# Patient Record
Sex: Female | Born: 1935 | Race: White | Hispanic: No | Smoking: Never smoker
Health system: Southern US, Community
[De-identification: ages and names within clinical notes are randomized; demographics above are authoritative.]

## PROBLEM LIST (undated history)

## (undated) DIAGNOSIS — I4891 Unspecified atrial fibrillation: Secondary | ICD-10-CM

## (undated) DIAGNOSIS — K5790 Diverticulosis of intestine, part unspecified, without perforation or abscess without bleeding: Secondary | ICD-10-CM

## (undated) DIAGNOSIS — I700 Atherosclerosis of aorta: Secondary | ICD-10-CM

## (undated) DIAGNOSIS — K279 Peptic ulcer, site unspecified, unspecified as acute or chronic, without hemorrhage or perforation: Secondary | ICD-10-CM

## (undated) DIAGNOSIS — M8580 Other specified disorders of bone density and structure, unspecified site: Secondary | ICD-10-CM

## (undated) DIAGNOSIS — D649 Anemia, unspecified: Secondary | ICD-10-CM

## (undated) DIAGNOSIS — C800 Disseminated malignant neoplasm, unspecified: Secondary | ICD-10-CM

## (undated) DIAGNOSIS — D164 Benign neoplasm of bones of skull and face: Secondary | ICD-10-CM

## (undated) DIAGNOSIS — R519 Headache, unspecified: Secondary | ICD-10-CM

## (undated) DIAGNOSIS — E785 Hyperlipidemia, unspecified: Secondary | ICD-10-CM

## (undated) DIAGNOSIS — M1990 Unspecified osteoarthritis, unspecified site: Secondary | ICD-10-CM

## (undated) DIAGNOSIS — I499 Cardiac arrhythmia, unspecified: Secondary | ICD-10-CM

## (undated) DIAGNOSIS — H269 Unspecified cataract: Secondary | ICD-10-CM

## (undated) DIAGNOSIS — K219 Gastro-esophageal reflux disease without esophagitis: Secondary | ICD-10-CM

## (undated) HISTORY — PX: OTHER SURGICAL HISTORY: SHX169

## (undated) HISTORY — DX: Atherosclerosis of aorta: I70.0

## (undated) HISTORY — DX: Diverticulosis of intestine, part unspecified, without perforation or abscess without bleeding: K57.90

## (undated) HISTORY — DX: Unspecified atrial fibrillation: I48.91

## (undated) HISTORY — DX: Unspecified cataract: H26.9

## (undated) HISTORY — DX: Anemia, unspecified: D64.9

## (undated) HISTORY — DX: Unspecified osteoarthritis, unspecified site: M19.90

## (undated) HISTORY — DX: Hyperlipidemia, unspecified: E78.5

## (undated) HISTORY — PX: TUBAL LIGATION: SHX77

## (undated) HISTORY — DX: Other specified disorders of bone density and structure, unspecified site: M85.80

## (undated) HISTORY — PX: COLONOSCOPY: SHX174

## (undated) HISTORY — DX: Gastro-esophageal reflux disease without esophagitis: K21.9

## (undated) HISTORY — DX: Disseminated malignant neoplasm, unspecified: C80.0

## (undated) HISTORY — PX: CATARACT EXTRACTION, BILATERAL: SHX1313

## (undated) HISTORY — DX: Benign neoplasm of bones of skull and face: D16.4

## (undated) HISTORY — DX: Peptic ulcer, site unspecified, unspecified as acute or chronic, without hemorrhage or perforation: K27.9

---

## 1998-05-12 ENCOUNTER — Other Ambulatory Visit: Admission: RE | Admit: 1998-05-12 | Discharge: 1998-05-12 | Payer: Self-pay | Admitting: Obstetrics & Gynecology

## 1999-08-16 ENCOUNTER — Other Ambulatory Visit: Admission: RE | Admit: 1999-08-16 | Discharge: 1999-08-16 | Payer: Self-pay | Admitting: Obstetrics & Gynecology

## 2000-10-10 ENCOUNTER — Other Ambulatory Visit: Admission: RE | Admit: 2000-10-10 | Discharge: 2000-10-10 | Payer: Self-pay | Admitting: Obstetrics & Gynecology

## 2014-11-18 DIAGNOSIS — H25811 Combined forms of age-related cataract, right eye: Secondary | ICD-10-CM | POA: Diagnosis not present

## 2014-11-18 DIAGNOSIS — K219 Gastro-esophageal reflux disease without esophagitis: Secondary | ICD-10-CM | POA: Diagnosis not present

## 2014-11-18 DIAGNOSIS — Z8711 Personal history of peptic ulcer disease: Secondary | ICD-10-CM | POA: Diagnosis not present

## 2014-12-11 DIAGNOSIS — Z23 Encounter for immunization: Secondary | ICD-10-CM | POA: Diagnosis not present

## 2014-12-15 DIAGNOSIS — Z0000 Encounter for general adult medical examination without abnormal findings: Secondary | ICD-10-CM | POA: Diagnosis not present

## 2014-12-15 DIAGNOSIS — Z1231 Encounter for screening mammogram for malignant neoplasm of breast: Secondary | ICD-10-CM | POA: Diagnosis not present

## 2014-12-30 DIAGNOSIS — K219 Gastro-esophageal reflux disease without esophagitis: Secondary | ICD-10-CM | POA: Diagnosis not present

## 2014-12-30 DIAGNOSIS — Z79899 Other long term (current) drug therapy: Secondary | ICD-10-CM | POA: Diagnosis not present

## 2014-12-30 DIAGNOSIS — H25812 Combined forms of age-related cataract, left eye: Secondary | ICD-10-CM | POA: Diagnosis not present

## 2015-01-05 DIAGNOSIS — Z1231 Encounter for screening mammogram for malignant neoplasm of breast: Secondary | ICD-10-CM | POA: Diagnosis not present

## 2015-01-28 DIAGNOSIS — K219 Gastro-esophageal reflux disease without esophagitis: Secondary | ICD-10-CM | POA: Diagnosis not present

## 2015-01-28 DIAGNOSIS — E785 Hyperlipidemia, unspecified: Secondary | ICD-10-CM | POA: Diagnosis not present

## 2015-03-31 DIAGNOSIS — R69 Illness, unspecified: Secondary | ICD-10-CM | POA: Diagnosis not present

## 2015-06-15 DIAGNOSIS — E785 Hyperlipidemia, unspecified: Secondary | ICD-10-CM | POA: Diagnosis not present

## 2015-10-06 DIAGNOSIS — R69 Illness, unspecified: Secondary | ICD-10-CM | POA: Diagnosis not present

## 2015-11-25 DIAGNOSIS — R69 Illness, unspecified: Secondary | ICD-10-CM | POA: Diagnosis not present

## 2015-12-21 DIAGNOSIS — Z0001 Encounter for general adult medical examination with abnormal findings: Secondary | ICD-10-CM | POA: Diagnosis not present

## 2015-12-21 DIAGNOSIS — K30 Functional dyspepsia: Secondary | ICD-10-CM | POA: Diagnosis not present

## 2015-12-21 DIAGNOSIS — E785 Hyperlipidemia, unspecified: Secondary | ICD-10-CM | POA: Diagnosis not present

## 2015-12-21 DIAGNOSIS — Z1231 Encounter for screening mammogram for malignant neoplasm of breast: Secondary | ICD-10-CM | POA: Diagnosis not present

## 2015-12-21 DIAGNOSIS — R5383 Other fatigue: Secondary | ICD-10-CM | POA: Diagnosis not present

## 2015-12-21 DIAGNOSIS — Z1389 Encounter for screening for other disorder: Secondary | ICD-10-CM | POA: Diagnosis not present

## 2016-01-13 DIAGNOSIS — Z0000 Encounter for general adult medical examination without abnormal findings: Secondary | ICD-10-CM | POA: Diagnosis not present

## 2016-01-13 DIAGNOSIS — E785 Hyperlipidemia, unspecified: Secondary | ICD-10-CM | POA: Diagnosis not present

## 2016-01-13 DIAGNOSIS — Z6834 Body mass index (BMI) 34.0-34.9, adult: Secondary | ICD-10-CM | POA: Diagnosis not present

## 2016-01-13 DIAGNOSIS — K219 Gastro-esophageal reflux disease without esophagitis: Secondary | ICD-10-CM | POA: Diagnosis not present

## 2016-02-12 DIAGNOSIS — Z1231 Encounter for screening mammogram for malignant neoplasm of breast: Secondary | ICD-10-CM | POA: Diagnosis not present

## 2016-04-11 DIAGNOSIS — R69 Illness, unspecified: Secondary | ICD-10-CM | POA: Diagnosis not present

## 2016-10-19 DIAGNOSIS — R69 Illness, unspecified: Secondary | ICD-10-CM | POA: Diagnosis not present

## 2016-10-31 DIAGNOSIS — R69 Illness, unspecified: Secondary | ICD-10-CM | POA: Diagnosis not present

## 2016-11-10 DIAGNOSIS — R69 Illness, unspecified: Secondary | ICD-10-CM | POA: Diagnosis not present

## 2017-01-18 DIAGNOSIS — Z1389 Encounter for screening for other disorder: Secondary | ICD-10-CM | POA: Diagnosis not present

## 2017-01-18 DIAGNOSIS — N390 Urinary tract infection, site not specified: Secondary | ICD-10-CM | POA: Diagnosis not present

## 2017-01-18 DIAGNOSIS — M8580 Other specified disorders of bone density and structure, unspecified site: Secondary | ICD-10-CM | POA: Diagnosis not present

## 2017-01-18 DIAGNOSIS — Z23 Encounter for immunization: Secondary | ICD-10-CM | POA: Diagnosis not present

## 2017-01-18 DIAGNOSIS — R300 Dysuria: Secondary | ICD-10-CM | POA: Diagnosis not present

## 2017-01-18 DIAGNOSIS — Z1231 Encounter for screening mammogram for malignant neoplasm of breast: Secondary | ICD-10-CM | POA: Diagnosis not present

## 2017-01-18 DIAGNOSIS — Z0000 Encounter for general adult medical examination without abnormal findings: Secondary | ICD-10-CM | POA: Diagnosis not present

## 2017-01-18 DIAGNOSIS — E2839 Other primary ovarian failure: Secondary | ICD-10-CM | POA: Diagnosis not present

## 2017-03-08 DIAGNOSIS — M8580 Other specified disorders of bone density and structure, unspecified site: Secondary | ICD-10-CM | POA: Diagnosis not present

## 2017-03-08 DIAGNOSIS — E2839 Other primary ovarian failure: Secondary | ICD-10-CM | POA: Diagnosis not present

## 2017-03-08 DIAGNOSIS — Z1231 Encounter for screening mammogram for malignant neoplasm of breast: Secondary | ICD-10-CM | POA: Diagnosis not present

## 2017-03-08 DIAGNOSIS — M85851 Other specified disorders of bone density and structure, right thigh: Secondary | ICD-10-CM | POA: Diagnosis not present

## 2017-03-23 DIAGNOSIS — R928 Other abnormal and inconclusive findings on diagnostic imaging of breast: Secondary | ICD-10-CM | POA: Diagnosis not present

## 2017-03-23 DIAGNOSIS — R921 Mammographic calcification found on diagnostic imaging of breast: Secondary | ICD-10-CM | POA: Diagnosis not present

## 2017-04-26 DIAGNOSIS — R69 Illness, unspecified: Secondary | ICD-10-CM | POA: Diagnosis not present

## 2017-09-18 DIAGNOSIS — R921 Mammographic calcification found on diagnostic imaging of breast: Secondary | ICD-10-CM | POA: Diagnosis not present

## 2017-10-11 DIAGNOSIS — R69 Illness, unspecified: Secondary | ICD-10-CM | POA: Diagnosis not present

## 2017-10-30 DIAGNOSIS — E1121 Type 2 diabetes mellitus with diabetic nephropathy: Secondary | ICD-10-CM | POA: Diagnosis not present

## 2017-10-30 DIAGNOSIS — R5382 Chronic fatigue, unspecified: Secondary | ICD-10-CM | POA: Diagnosis not present

## 2017-10-30 DIAGNOSIS — R69 Illness, unspecified: Secondary | ICD-10-CM | POA: Diagnosis not present

## 2018-01-18 DIAGNOSIS — R1031 Right lower quadrant pain: Secondary | ICD-10-CM | POA: Diagnosis not present

## 2018-01-18 DIAGNOSIS — Z1331 Encounter for screening for depression: Secondary | ICD-10-CM | POA: Diagnosis not present

## 2018-01-18 DIAGNOSIS — Z6825 Body mass index (BMI) 25.0-25.9, adult: Secondary | ICD-10-CM | POA: Diagnosis not present

## 2018-01-18 DIAGNOSIS — E785 Hyperlipidemia, unspecified: Secondary | ICD-10-CM | POA: Diagnosis not present

## 2018-01-18 DIAGNOSIS — Z0001 Encounter for general adult medical examination with abnormal findings: Secondary | ICD-10-CM | POA: Diagnosis not present

## 2018-01-18 DIAGNOSIS — R1011 Right upper quadrant pain: Secondary | ICD-10-CM | POA: Diagnosis not present

## 2018-01-18 DIAGNOSIS — Z1339 Encounter for screening examination for other mental health and behavioral disorders: Secondary | ICD-10-CM | POA: Diagnosis not present

## 2018-01-23 DIAGNOSIS — R1011 Right upper quadrant pain: Secondary | ICD-10-CM | POA: Diagnosis not present

## 2018-01-23 DIAGNOSIS — K7689 Other specified diseases of liver: Secondary | ICD-10-CM | POA: Diagnosis not present

## 2018-02-13 DIAGNOSIS — R1011 Right upper quadrant pain: Secondary | ICD-10-CM | POA: Diagnosis not present

## 2018-03-22 DIAGNOSIS — R928 Other abnormal and inconclusive findings on diagnostic imaging of breast: Secondary | ICD-10-CM | POA: Diagnosis not present

## 2018-03-22 DIAGNOSIS — R921 Mammographic calcification found on diagnostic imaging of breast: Secondary | ICD-10-CM | POA: Diagnosis not present

## 2018-08-13 DIAGNOSIS — Z6824 Body mass index (BMI) 24.0-24.9, adult: Secondary | ICD-10-CM | POA: Diagnosis not present

## 2018-08-13 DIAGNOSIS — M5431 Sciatica, right side: Secondary | ICD-10-CM | POA: Diagnosis not present

## 2018-08-20 DIAGNOSIS — R69 Illness, unspecified: Secondary | ICD-10-CM | POA: Diagnosis not present

## 2018-11-15 DIAGNOSIS — R69 Illness, unspecified: Secondary | ICD-10-CM | POA: Diagnosis not present

## 2019-01-15 DIAGNOSIS — M810 Age-related osteoporosis without current pathological fracture: Secondary | ICD-10-CM | POA: Diagnosis not present

## 2019-01-15 DIAGNOSIS — Z1331 Encounter for screening for depression: Secondary | ICD-10-CM | POA: Diagnosis not present

## 2019-01-15 DIAGNOSIS — Z0000 Encounter for general adult medical examination without abnormal findings: Secondary | ICD-10-CM | POA: Diagnosis not present

## 2019-01-15 DIAGNOSIS — R5383 Other fatigue: Secondary | ICD-10-CM | POA: Diagnosis not present

## 2019-01-15 DIAGNOSIS — Z6825 Body mass index (BMI) 25.0-25.9, adult: Secondary | ICD-10-CM | POA: Diagnosis not present

## 2019-01-15 DIAGNOSIS — Z1339 Encounter for screening examination for other mental health and behavioral disorders: Secondary | ICD-10-CM | POA: Diagnosis not present

## 2019-01-15 DIAGNOSIS — N390 Urinary tract infection, site not specified: Secondary | ICD-10-CM | POA: Diagnosis not present

## 2019-01-15 DIAGNOSIS — R300 Dysuria: Secondary | ICD-10-CM | POA: Diagnosis not present

## 2019-01-15 DIAGNOSIS — M25551 Pain in right hip: Secondary | ICD-10-CM | POA: Diagnosis not present

## 2019-01-15 DIAGNOSIS — E785 Hyperlipidemia, unspecified: Secondary | ICD-10-CM | POA: Diagnosis not present

## 2019-02-04 DIAGNOSIS — R079 Chest pain, unspecified: Secondary | ICD-10-CM | POA: Diagnosis not present

## 2019-02-07 DIAGNOSIS — I4891 Unspecified atrial fibrillation: Secondary | ICD-10-CM | POA: Diagnosis not present

## 2019-02-07 DIAGNOSIS — R739 Hyperglycemia, unspecified: Secondary | ICD-10-CM

## 2019-02-07 DIAGNOSIS — I480 Paroxysmal atrial fibrillation: Secondary | ICD-10-CM | POA: Diagnosis not present

## 2019-02-07 DIAGNOSIS — R079 Chest pain, unspecified: Secondary | ICD-10-CM | POA: Diagnosis not present

## 2019-02-07 DIAGNOSIS — R7303 Prediabetes: Secondary | ICD-10-CM | POA: Diagnosis not present

## 2019-02-07 DIAGNOSIS — Z8711 Personal history of peptic ulcer disease: Secondary | ICD-10-CM | POA: Diagnosis not present

## 2019-02-07 DIAGNOSIS — R0602 Shortness of breath: Secondary | ICD-10-CM | POA: Diagnosis not present

## 2019-02-07 DIAGNOSIS — Z79899 Other long term (current) drug therapy: Secondary | ICD-10-CM | POA: Diagnosis not present

## 2019-02-08 DIAGNOSIS — I480 Paroxysmal atrial fibrillation: Secondary | ICD-10-CM

## 2019-02-08 DIAGNOSIS — Z79899 Other long term (current) drug therapy: Secondary | ICD-10-CM | POA: Diagnosis not present

## 2019-02-08 DIAGNOSIS — R7303 Prediabetes: Secondary | ICD-10-CM | POA: Diagnosis not present

## 2019-02-08 DIAGNOSIS — I4891 Unspecified atrial fibrillation: Secondary | ICD-10-CM | POA: Diagnosis not present

## 2019-02-08 DIAGNOSIS — Z8711 Personal history of peptic ulcer disease: Secondary | ICD-10-CM | POA: Diagnosis not present

## 2019-02-08 HISTORY — DX: Paroxysmal atrial fibrillation: I48.0

## 2019-02-15 DIAGNOSIS — Z1331 Encounter for screening for depression: Secondary | ICD-10-CM | POA: Diagnosis not present

## 2019-02-15 DIAGNOSIS — Z6824 Body mass index (BMI) 24.0-24.9, adult: Secondary | ICD-10-CM | POA: Diagnosis not present

## 2019-02-15 DIAGNOSIS — I4891 Unspecified atrial fibrillation: Secondary | ICD-10-CM | POA: Diagnosis not present

## 2019-02-15 DIAGNOSIS — R5383 Other fatigue: Secondary | ICD-10-CM | POA: Diagnosis not present

## 2019-02-20 DIAGNOSIS — K828 Other specified diseases of gallbladder: Secondary | ICD-10-CM | POA: Diagnosis not present

## 2019-02-20 DIAGNOSIS — K760 Fatty (change of) liver, not elsewhere classified: Secondary | ICD-10-CM | POA: Diagnosis not present

## 2019-02-20 DIAGNOSIS — Z20822 Contact with and (suspected) exposure to covid-19: Secondary | ICD-10-CM | POA: Diagnosis not present

## 2019-02-20 DIAGNOSIS — R1011 Right upper quadrant pain: Secondary | ICD-10-CM | POA: Diagnosis not present

## 2019-02-20 DIAGNOSIS — J9811 Atelectasis: Secondary | ICD-10-CM | POA: Diagnosis not present

## 2019-02-21 DIAGNOSIS — R69 Illness, unspecified: Secondary | ICD-10-CM | POA: Diagnosis not present

## 2019-03-04 ENCOUNTER — Encounter: Payer: Self-pay | Admitting: *Deleted

## 2019-03-04 ENCOUNTER — Ambulatory Visit (INDEPENDENT_AMBULATORY_CARE_PROVIDER_SITE_OTHER): Payer: Medicare HMO

## 2019-03-04 ENCOUNTER — Encounter: Payer: Self-pay | Admitting: Cardiology

## 2019-03-04 ENCOUNTER — Ambulatory Visit (INDEPENDENT_AMBULATORY_CARE_PROVIDER_SITE_OTHER): Payer: Medicare HMO | Admitting: Cardiology

## 2019-03-04 ENCOUNTER — Other Ambulatory Visit: Payer: Self-pay

## 2019-03-04 VITALS — BP 112/80 | HR 102 | Ht 62.0 in | Wt 135.0 lb

## 2019-03-04 DIAGNOSIS — D6481 Anemia due to antineoplastic chemotherapy: Secondary | ICD-10-CM | POA: Insufficient documentation

## 2019-03-04 DIAGNOSIS — D649 Anemia, unspecified: Secondary | ICD-10-CM | POA: Insufficient documentation

## 2019-03-04 DIAGNOSIS — R531 Weakness: Secondary | ICD-10-CM | POA: Diagnosis not present

## 2019-03-04 DIAGNOSIS — R0602 Shortness of breath: Secondary | ICD-10-CM | POA: Diagnosis not present

## 2019-03-04 DIAGNOSIS — R42 Dizziness and giddiness: Secondary | ICD-10-CM | POA: Diagnosis not present

## 2019-03-04 DIAGNOSIS — T451X5A Adverse effect of antineoplastic and immunosuppressive drugs, initial encounter: Secondary | ICD-10-CM | POA: Insufficient documentation

## 2019-03-04 DIAGNOSIS — R079 Chest pain, unspecified: Secondary | ICD-10-CM

## 2019-03-04 DIAGNOSIS — K279 Peptic ulcer, site unspecified, unspecified as acute or chronic, without hemorrhage or perforation: Secondary | ICD-10-CM | POA: Insufficient documentation

## 2019-03-04 DIAGNOSIS — I480 Paroxysmal atrial fibrillation: Secondary | ICD-10-CM | POA: Diagnosis not present

## 2019-03-04 DIAGNOSIS — I4891 Unspecified atrial fibrillation: Secondary | ICD-10-CM | POA: Diagnosis not present

## 2019-03-04 DIAGNOSIS — M1990 Unspecified osteoarthritis, unspecified site: Secondary | ICD-10-CM | POA: Insufficient documentation

## 2019-03-04 NOTE — Progress Notes (Addendum)
Cardiology Office Note:    Date:  03/04/2019   ID:  Whitney Floyd, DOB [birth year], MRN [id number]  PCP:  Ernestene Kiel, MD  Cardiologist:  Berniece Salines, DO  Electrophysiologist:  None   Referring MD: Ernestene Kiel, MD   Patient was referred for newly diagnosed atrial fibrillation  History of Present Illness:    Whitney Floyd is a 85 y.o. female with a hx of peptic ulcer disease on omeprazole, paroxysmal atrial fibrillation which was diagnosed on a recent hospitalization on 02/07/2019 at [facility].  Patient was started on Eliquis 5 mg twice daily as well as metoprolol succinate 25 mg daily.   Patient is here to reestablish cardiovascular care.  The patient tells me today that she has been experiencing significant fatigue and dizziness.  She notes that this has been going on prior to the hospital and since she was discharged.  In addition she admits to experiencing left-sided chest pain.  She described it as a left-sided pressure and dull sensation which lasted for a few seconds and resolve itself.  She also reported associated shortness of breath.  She is very worried because she is to be very active with 5 line dancing classes a week, she could do her on push lawnmowing and raking but recently she can barely get out of the chair.   Past Medical History:  Diagnosis Date  . Anemia   . Arthritis   . Paroxysmal A-fib ([place]) 02/08/2019  . Peptic ulcer disease     Past Surgical History:  Procedure Laterality Date  . TUBAL LIGATION      Current Medications: Current Meds  Medication Sig  . Cholecalciferol 25 MCG (1000 UT) capsule Take 1,000 Units by mouth daily.  Verneita Griffes Bark POWD 500 mg by Does not apply route daily.  . Cranberry-Vitamin C-Probiotic (AZO CRANBERRY) 250-30 MG TABS Take by mouth 2 (two) times daily.  Marland Kitchen ELIQUIS 5 MG TABS tablet Take 5 mg by mouth 2 (two) times daily.  . metoprolol succinate (TOPROL-XL) 25 MG 24 hr tablet Take 25 mg by mouth  daily.  Marland Kitchen omeprazole (PRILOSEC) 20 MG capsule Take 20 mg by mouth daily.     Allergies:   Horse-derived products and Sulfa antibiotics   Social History   Socioeconomic History  . Marital status: Single    Spouse name: Not on file  . Number of children: Not on file  . Years of education: Not on file  . Highest education level: Not on file  Occupational History  . Not on file  Tobacco Use  . Smoking status: Never Smoker  . Smokeless tobacco: Never Used  Substance and Sexual Activity  . Alcohol use: Never  . Drug use: Never  . Sexual activity: Not on file  Other Topics Concern  . Not on file  Social History Narrative  . Not on file   Social Determinants of Health   Financial Resource Strain:   . Difficulty of Paying Living Expenses: Not on file  Food Insecurity:   . Worried About Charity fundraiser in the Last Year: Not on file  . Ran Out of Food in the Last Year: Not on file  Transportation Needs:   . Lack of Transportation (Medical): Not on file  . Lack of Transportation (Non-Medical): Not on file  Physical Activity:   . Days of Exercise per Week: Not on file  . Minutes of Exercise per Session: Not on file  Stress:   . Feeling of Stress :  Not on file  Social Connections:   . Frequency of Communication with Friends and Family: Not on file  . Frequency of Social Gatherings with Friends and Family: Not on file  . Attends Religious Services: Not on file  . Active Member of Clubs or Organizations: Not on file  . Attends Archivist Meetings: Not on file  . Marital Status: Not on file     Family History: The patient's family history includes Arrhythmia in her father; Dementia in her mother; Diabetes in her mother; Stroke in her father.  ROS:   Review of Systems  Constitution: Negative for decreased appetite, fever and weight gain.  HENT: Negative for congestion, ear discharge, hoarse voice and sore throat.   Eyes: Negative for discharge, redness, vision  loss in right eye and visual halos.  Cardiovascular: Reports chest pain, dyspnea on exertion, and negative for leg swelling, orthopnea and palpitations.  Respiratory: Negative for cough, hemoptysis, shortness of breath and snoring.   Endocrine: Negative for heat intolerance and polyphagia.  Hematologic/Lymphatic: Negative for bleeding problem. Does not bruise/bleed easily.  Skin: Negative for flushing, nail changes, rash and suspicious lesions.  Musculoskeletal: Negative for arthritis, joint pain, muscle cramps, myalgias, neck pain and stiffness.  Gastrointestinal: Negative for abdominal pain, bowel incontinence, diarrhea and excessive appetite.  Genitourinary: Negative for decreased libido, genital sores and incomplete emptying.  Neurological: Negative for brief paralysis, focal weakness, headaches and loss of balance.  Psychiatric/Behavioral: Negative for altered mental status, depression and suicidal ideas.  Allergic/Immunologic: Negative for HIV exposure and persistent infections.    EKGs/Labs/Other Studies Reviewed:    The following studies were reviewed today:   EKG:  The ekg ordered today demonstrates sinus rhythm, heart rate 91 bpm with arrhythmia Compared to EKG done on 02/20/2019 patient was in sinus rhythm, heart rate 78 bpm with nonspecific ST changes.  Echocardiogram done at [facility] on 02/08/2019 normal left systolic function visually estimate ejection fraction 85 to 60%.  Diastolic filling pattern was normal for age.  Right ventricle is normal.  Right atrium normal.  Left atrium is normal.  Interatrial septum is intact.  Mild aortic valve sclerosis without stenosis.  No aortic regurgitation.  Trace mitral regurgitation.  No tricuspid regurgitation.  No pulmonic vegetation.  Aortic root, ascending aorta and aortic arch are normal in size.  No pericardial effusion.  CTA chest 02/07/2019 No evidence of pulmonary embolism.  No appreciation of thoracic aneurysm or  dissection.  Mild aortic sclerosis.  No pleural effusion.  Aortic atherosclerosis.  Fluid in the region of the spleen presumably ascites.  Recent Labs: CBC: WBC 7.3, globin 12.3, hematocrit 37, platelet 279 Chemistry: Sodium 137, potassium 3.9, chloride 107, bicarb 27, BUN 9, creatinine 0.8, glucose 95 Hemoglobin A1c 6.1  Recent Lipid Panel No results found for: CHOL, TRIG, HDL, CHOLHDL, VLDL, LDLCALC, LDLDIRECT  Physical Exam:    VS:  BP 112/80 (BP Location: Left Arm, Patient Position: Sitting, Cuff Size: Normal)   Pulse (!) 102   Ht 5\' 2"  (1.575 m)   Wt 135 lb (61.2 kg)   SpO2 97%   BMI 24.69 kg/m     Wt Readings from Last 3 Encounters:  03/04/19 135 lb (61.2 kg)  02/08/19 134 lb (60.8 kg)     GEN: Well nourished, well developed in no acute distress HEENT: Normal NECK: No JVD; No carotid bruits LYMPHATICS: No lymphadenopathy CARDIAC: S1S2 noted,RRR, no murmurs, rubs, gallops RESPIRATORY:  Clear to auscultation without rales, wheezing or rhonchi  ABDOMEN:  Soft, non-tender, non-distended, +bowel sounds, no guarding. EXTREMITIES: No edema, No cyanosis, no clubbing MUSCULOSKELETAL:  No edema; No deformity  SKIN: Warm and dry NEUROLOGIC:  Alert and oriented x 3, non-focal PSYCHIATRIC:  Normal affect, good insight  ASSESSMENT:    1. Paroxysmal atrial fibrillation (HCC)   2. Dizziness   3. Shortness of breath   4. Chest pain of uncertain etiology   5. Generalized weakness    PLAN:    Chest pain-with her risk factors and symptoms of chest discomfort is appropriate to pursue an ischemic evaluation in this patient.  I have discussed with the patient about pharmacologic nuclear stress test, at this time she is agreed to proceed with this testing.  She was also advised if the pain recurs to go to the nearest emergency department.  Shortness of breath-I reviewed her echocardiogram which was done at [facility] structural abnormalities that could explain this.  I do  suspect that paroxysmal atrial fibrillation could be contributing.  At this point cannot rule out that this could be an anginal equivalent therefore as stated above ischemic evaluation will be performed as described above.  Paroxysmal atrial fibrillation-she was started on Eliquis 5 mg twice daily as well as metoprolol succinate 12.5 mg daily.  She is in sinus rhythm today.  Given her dizziness and generalized fatigue I like to understand her atrial fibrillation burden, heart excursions and if there is any other underlying arrhythmias so we can place a 7-day ZIO monitor on the patient.  For now we will continue with rate control strategy.  Once we can rule out coronary disease we can be able to understand and make appropriate recommendation for antiarrhythmics.  I have educated patient on the side effects of this medication. I also urge her to abstain from any taking behaviors.  She understands that she is now at a high risk of bleeding due to being on anticoagulation.  She was also advised that if she ever falls and especially hit her head to be seen in the emergency department.  She will get comprehensive blood work today which will include CBC to make sure that there is no drop in her hemoglobin given her worsening generalized fatigue and shortness of breath, TSH, BMP and mag.  The patient and her daughter-in-law on the phone are both in agreement with the above plan. The patient left the office in stable condition.  The patient will follow up in 2 months or sooner if needed.  Medication Adjustments/Labs and Tests Ordered: Current medicines are reviewed at length with the patient today.  Concerns regarding medicines are outlined above.  Orders Placed This Encounter  Procedures  . Basic Metabolic Panel (BMET)  . Magnesium  . CBC  . TSH  . Hepatic function panel  . LONG TERM MONITOR (3-14 DAYS)  . MYOCARDIAL PERFUSION IMAGING  . EKG 12-Lead   No orders of the defined types were placed in this  encounter.   Patient Instructions  Medication Instructions:  Your physician recommends that you continue on your current medications as directed. Please refer to the Current Medication list given to you today.  *If you need a refill on your cardiac medications before your next appointment, please call your pharmacy*  Lab Work: Your physician recommends that you return for lab work in: TODAY BMP,CBC,TSH,LIVER,Magnesium  If you have labs (blood work) drawn today and your tests are completely normal, you will receive your results only by: Marland Kitchen MyChart Message (if you have MyChart) OR . A  paper copy in the mail If you have any lab test that is abnormal or we need to change your treatment, we will call you to review the results.  Testing/Procedures: A zio monitor was ordered today. It will remain on for 7 days. You will then return monitor and event diary in provided box. It takes 1-2 weeks for report to be downloaded and returned to [place]. We will call you with the results. If monitor falls off or has orange flashing light, please call Zio for further instructions.   Your physician has requested that you have a lexiscan myoview. For further information please visit [email or web address]. Please follow instruction sheet, as given.   Follow-Up: At [facility], you and your health needs are our priority.  As part of our continuing mission to provide you with exceptional heart care, we have created designated Provider Care Teams.  These Care Teams include your primary Cardiologist (physician) and Advanced Practice Providers (APPs -  Physician Assistants and Nurse Practitioners) who all work together to provide you with the care you need, when you need it.  Your next appointment:  2 months  The format for your next appointment:   In PERSON  Provider:   Dr. Berniece Salines  Other Instructions  Cardiac Nuclear Scan A cardiac nuclear scan is a test that is done to check the flow of blood to your  heart. It is done when you are resting and when you are exercising. The test looks for problems such as:  Not enough blood reaching a portion of the heart.  The heart muscle not working as it should. You may need this test if:  You have heart disease.  You have had lab results that are not normal.  You have had heart surgery or a balloon procedure to open up blocked arteries (angioplasty).  You have chest pain.  You have shortness of breath. In this test, a special dye (tracer) is put into your bloodstream. The tracer will travel to your heart. A camera will then take pictures of your heart to see how the tracer moves through your heart. This test is usually done at a hospital and takes 2-4 hours. Tell a doctor about:  Any allergies you have.  All medicines you are taking, including vitamins, herbs, eye drops, creams, and over-the-counter medicines.  Any problems you or family members have had with anesthetic medicines.  Any blood disorders you have.  Any surgeries you have had.  Any medical conditions you have.  Whether you are pregnant or may be pregnant. What are the risks? Generally, this is a safe test. However, problems may occur, such as:  Serious chest pain and heart attack. This is only a risk if the stress portion of the test is done.  Rapid heartbeat.  A feeling of warmth in your chest. This feeling usually does not last long.  Allergic reaction to the tracer. What happens before the test?  Ask your doctor about changing or stopping your normal medicines. This is important.  Follow instructions from your doctor about what you cannot eat or drink.  Remove your jewelry on the day of the test. What happens during the test?  An IV tube will be inserted into one of your veins.  Your doctor will give you a small amount of tracer through the IV tube.  You will wait for 20-40 minutes while the tracer moves through your bloodstream.  Your heart will be  monitored with an electrocardiogram (ECG).  You will lie  down on an exam table.  Pictures of your heart will be taken for about 15-20 minutes.  You may also have a stress test. For this test, one of these things may be done: ? You will be asked to exercise on a treadmill or a stationary bike. ? You will be given medicines that will make your heart work harder. This is done if you are unable to exercise.  When blood flow to your heart has peaked, a tracer will again be given through the IV tube.  After 20-40 minutes, you will get back on the exam table. More pictures will be taken of your heart.  Depending on the tracer that is used, more pictures may need to be taken 3-4 hours later.  Your IV tube will be removed when the test is over. The test may vary among doctors and hospitals. What happens after the test?  Ask your doctor: ? Whether you can return to your normal schedule, including diet, activities, and medicines. ? Whether you should drink more fluids. This will help to remove the tracer from your body. Drink enough fluid to keep your pee (urine) pale yellow.  Ask your doctor, or the department that is doing the test: ? When will my results be ready? ? How will I get my results? Summary  A cardiac nuclear scan is a test that is done to check the flow of blood to your heart.  Tell your doctor whether you are pregnant or may be pregnant.  Before the test, ask your doctor about changing or stopping your normal medicines. This is important.  Ask your doctor whether you can return to your normal activities. You may be asked to drink more fluids. This information is not intended to replace advice given to you by your health care provider. Make sure you discuss any questions you have with your health care provider. Document Revised: 05/16/2018 Document Reviewed: 07/10/2017 Elsevier Patient Education  [place].    Adopting a Healthy Lifestyle.  Know what a healthy  weight is for you (roughly BMI <25) and aim to maintain this   Aim for 7+ servings of fruits and vegetables daily   65-80+ fluid ounces of water or unsweet tea for healthy kidneys   Limit to max 1 drink of alcohol per day; avoid smoking/tobacco   Limit animal fats in diet for cholesterol and heart health - choose grass fed whenever available   Avoid highly processed foods, and foods high in saturated/trans fats   Aim for low stress - take time to unwind and care for your mental health   Aim for 150 min of moderate intensity exercise weekly for heart health, and weights twice weekly for bone health   Aim for 7-9 hours of sleep daily   When it comes to diets, agreement about the perfect plan isnt easy to find, even among the experts. Experts at the [employer] developed an idea known as the Healthy Eating Plate. Just imagine a plate divided into logical, healthy portions.   The emphasis is on diet quality:   Load up on vegetables and fruits - one-half of your plate: Aim for color and variety, and remember that potatoes dont count.   Go for whole grains - one-quarter of your plate: Whole wheat, barley, wheat berries, quinoa, oats, brown rice, and foods made with them. If you want pasta, go with whole wheat pasta.   Protein power - one-quarter of your plate: Fish, chicken, beans, and  nuts are all healthy, versatile protein sources. Limit red meat.   The diet, however, does go beyond the plate, offering a few other suggestions.   Use healthy plant oils, such as olive, canola, soy, corn, sunflower and peanut. Check the labels, and avoid partially hydrogenated oil, which have unhealthy trans fats.   If youre thirsty, drink water. Coffee and tea are good in moderation, but skip sugary drinks and limit milk and dairy products to one or two daily servings.   The type of carbohydrate in the diet is more important than the amount. Some sources of carbohydrates, such as  vegetables, fruits, whole grains, and beans-are healthier than others.   Finally, stay active  Signed, Berniece Salines, DO  03/04/2019 11:12 AM    [place]

## 2019-03-04 NOTE — Patient Instructions (Signed)
Medication Instructions:  Your physician recommends that you continue on your current medications as directed. Please refer to the Current Medication list given to you today.  *If you need a refill on your cardiac medications before your next appointment, please call your pharmacy*  Lab Work: Your physician recommends that you return for lab work in: TODAY BMP,CBC,TSH,LIVER,Magnesium  If you have labs (blood work) drawn today and your tests are completely normal, you will receive your results only by:  Whitney Floyd (if you have MyChart) OR  A paper copy in the mail If you have any lab test that is abnormal or we need to change your treatment, we will call you to review the results.  Testing/Procedures: A zio monitor was ordered today. It will remain on for 7 days. You will then return monitor and event diary in provided box. It takes 1-2 weeks for report to be downloaded and returned to [place]. We will call you with the results. If monitor falls off or has orange flashing light, please call Zio for further instructions.   Your physician has requested that you have a lexiscan myoview. For further information please visit [email or web address]. Please follow instruction sheet, as given.   Follow-Up: At [facility], you and your health needs are our priority.  As part of our continuing mission to provide you with exceptional heart care, we have created designated Provider Care Teams.  These Care Teams include your primary Cardiologist (physician) and Advanced Practice Providers (APPs -  Physician Assistants and Nurse Practitioners) who all work together to provide you with the care you need, when you need it.  Your next appointment:  2 months  The format for your next appointment:   In PERSON  Provider:   Dr. Berniece Salines  Other Instructions  Cardiac Nuclear Scan A cardiac nuclear scan is a test that is done to check the flow of blood to your heart. It is done when you are resting and  when you are exercising. The test looks for problems such as:  Not enough blood reaching a portion of the heart.  The heart muscle not working as it should. You may need this test if:  You have heart disease.  You have had lab results that are not normal.  You have had heart surgery or a balloon procedure to open up blocked arteries (angioplasty).  You have chest pain.  You have shortness of breath. In this test, a special dye (tracer) is put into your bloodstream. The tracer will travel to your heart. A camera will then take pictures of your heart to see how the tracer moves through your heart. This test is usually done at a hospital and takes 2-4 hours. Tell a doctor about:  Any allergies you have.  All medicines you are taking, including vitamins, herbs, eye drops, creams, and over-the-counter medicines.  Any problems you or family members have had with anesthetic medicines.  Any blood disorders you have.  Any surgeries you have had.  Any medical conditions you have.  Whether you are pregnant or may be pregnant. What are the risks? Generally, this is a safe test. However, problems may occur, such as:  Serious chest pain and heart attack. This is only a risk if the stress portion of the test is done.  Rapid heartbeat.  A feeling of warmth in your chest. This feeling usually does not last long.  Allergic reaction to the tracer. What happens before the test?  Ask your doctor about changing or  stopping your normal medicines. This is important.  Follow instructions from your doctor about what you cannot eat or drink.  Remove your jewelry on the day of the test. What happens during the test?  An IV tube will be inserted into one of your veins.  Your doctor will give you a small amount of tracer through the IV tube.  You will wait for 20-40 minutes while the tracer moves through your bloodstream.  Your heart will be monitored with an electrocardiogram  (ECG).  You will lie down on an exam table.  Pictures of your heart will be taken for about 15-20 minutes.  You may also have a stress test. For this test, one of these things may be done: ? You will be asked to exercise on a treadmill or a stationary bike. ? You will be given medicines that will make your heart work harder. This is done if you are unable to exercise.  When blood flow to your heart has peaked, a tracer will again be given through the IV tube.  After 20-40 minutes, you will get back on the exam table. More pictures will be taken of your heart.  Depending on the tracer that is used, more pictures may need to be taken 3-4 hours later.  Your IV tube will be removed when the test is over. The test may vary among doctors and hospitals. What happens after the test?  Ask your doctor: ? Whether you can return to your normal schedule, including diet, activities, and medicines. ? Whether you should drink more fluids. This will help to remove the tracer from your body. Drink enough fluid to keep your pee (urine) pale yellow.  Ask your doctor, or the department that is doing the test: ? When will my results be ready? ? How will I get my results? Summary  A cardiac nuclear scan is a test that is done to check the flow of blood to your heart.  Tell your doctor whether you are pregnant or may be pregnant.  Before the test, ask your doctor about changing or stopping your normal medicines. This is important.  Ask your doctor whether you can return to your normal activities. You may be asked to drink more fluids. This information is not intended to replace advice given to you by your health care provider. Make sure you discuss any questions you have with your health care provider. Document Revised: 05/16/2018 Document Reviewed: 07/10/2017 Elsevier Patient Education  [place].

## 2019-03-05 ENCOUNTER — Telehealth: Payer: Self-pay | Admitting: *Deleted

## 2019-03-05 LAB — BASIC METABOLIC PANEL
BUN/Creatinine Ratio: 12 (ref 12–28)
BUN: 11 mg/dL (ref 8–27)
CO2: 22 mmol/L (ref 20–29)
Calcium: 8.5 mg/dL — ABNORMAL LOW (ref 8.7–10.3)
Chloride: 102 mmol/L (ref 96–106)
Creatinine, Ser: 0.92 mg/dL (ref 0.57–1.00)
GFR calc Af Amer: 67 mL/min/{1.73_m2} (ref >59)
GFR calc non Af Amer: 58 mL/min/{1.73_m2} — ABNORMAL LOW (ref >59)
Glucose: 106 mg/dL — ABNORMAL HIGH (ref 65–99)
Potassium: 4.4 mmol/L (ref 3.5–5.2)
Sodium: 137 mmol/L (ref 134–144)

## 2019-03-05 LAB — MAGNESIUM: Magnesium: 1.8 mg/dL (ref 1.6–2.3)

## 2019-03-05 LAB — HEPATIC FUNCTION PANEL
ALT: 25 IU/L (ref 0–32)
AST: 60 IU/L — ABNORMAL HIGH (ref 0–40)
Albumin: 3.6 g/dL (ref 3.6–4.6)
Alkaline Phosphatase: 69 IU/L (ref 39–117)
Bilirubin Total: 0.2 mg/dL (ref 0.0–1.2)
Bilirubin, Direct: 0.1 mg/dL (ref 0.00–0.40)
Total Protein: 7.4 g/dL (ref 6.0–8.5)

## 2019-03-05 LAB — CBC
Hematocrit: 40.9 % (ref 34.0–46.6)
Hemoglobin: 13.5 g/dL (ref 11.1–15.9)
MCH: 29.1 pg (ref 26.6–33.0)
MCHC: 33 g/dL (ref 31.5–35.7)
MCV: 88 fL (ref 79–97)
Platelets: 329 10*3/uL (ref 150–450)
RBC: 4.64 x10E6/uL (ref 3.77–5.28)
RDW: 12 % (ref 11.7–15.4)
WBC: 7 10*3/uL (ref 3.4–10.8)

## 2019-03-05 LAB — TSH: TSH: 2.58 u[IU]/mL (ref 0.450–4.500)

## 2019-03-05 NOTE — Telephone Encounter (Signed)
Patient given detailed instructions per Myocardial Perfusion Study Information Sheet for the test on 03/06/19. Patient notified to arrive 15 minutes early and that it is imperative to arrive on time for appointment to keep from having the test rescheduled.  If you need to cancel or reschedule your appointment, please call the office within 24 hours of your appointment. . Patient verbalized understanding. Kirstie Peri

## 2019-03-06 ENCOUNTER — Ambulatory Visit (INDEPENDENT_AMBULATORY_CARE_PROVIDER_SITE_OTHER): Payer: Medicare HMO

## 2019-03-06 ENCOUNTER — Other Ambulatory Visit: Payer: Self-pay

## 2019-03-06 VITALS — Ht 62.0 in | Wt 135.0 lb

## 2019-03-06 DIAGNOSIS — I480 Paroxysmal atrial fibrillation: Secondary | ICD-10-CM | POA: Diagnosis not present

## 2019-03-06 DIAGNOSIS — R079 Chest pain, unspecified: Secondary | ICD-10-CM

## 2019-03-06 DIAGNOSIS — R0602 Shortness of breath: Secondary | ICD-10-CM

## 2019-03-06 LAB — MYOCARDIAL PERFUSION IMAGING
LV dias vol: 41 mL (ref 46–106)
LV sys vol: 7 mL
Peak HR: 90 {beats}/min
Rest HR: 77 {beats}/min
SDS: 1
SRS: 0
SSS: 1
TID: 0.91

## 2019-03-06 MED ORDER — TECHNETIUM TC 99M TETROFOSMIN IV KIT
29.4000 | PACK | Freq: Once | INTRAVENOUS | Status: AC | PRN
  Administered 2019-03-06: 29.4 via INTRAVENOUS

## 2019-03-06 MED ORDER — TECHNETIUM TC 99M TETROFOSMIN IV KIT
10.2000 | PACK | Freq: Once | INTRAVENOUS | Status: AC | PRN
  Administered 2019-03-06: 10.2 via INTRAVENOUS

## 2019-03-06 MED ORDER — REGADENOSON 0.4 MG/5ML IV SOLN
0.4000 mg | Freq: Once | INTRAVENOUS | Status: AC
  Administered 2019-03-06: 0.4 mg via INTRAVENOUS

## 2019-03-07 ENCOUNTER — Encounter: Payer: Self-pay | Admitting: *Deleted

## 2019-03-13 DIAGNOSIS — R1084 Generalized abdominal pain: Secondary | ICD-10-CM | POA: Diagnosis not present

## 2019-03-13 DIAGNOSIS — Z6825 Body mass index (BMI) 25.0-25.9, adult: Secondary | ICD-10-CM | POA: Diagnosis not present

## 2019-03-13 DIAGNOSIS — R5383 Other fatigue: Secondary | ICD-10-CM | POA: Diagnosis not present

## 2019-03-13 DIAGNOSIS — I4891 Unspecified atrial fibrillation: Secondary | ICD-10-CM | POA: Diagnosis not present

## 2019-03-18 DIAGNOSIS — R42 Dizziness and giddiness: Secondary | ICD-10-CM | POA: Diagnosis not present

## 2019-03-20 DIAGNOSIS — R1084 Generalized abdominal pain: Secondary | ICD-10-CM | POA: Diagnosis not present

## 2019-03-20 DIAGNOSIS — R109 Unspecified abdominal pain: Secondary | ICD-10-CM | POA: Diagnosis not present

## 2019-03-26 DIAGNOSIS — R1904 Left lower quadrant abdominal swelling, mass and lump: Secondary | ICD-10-CM | POA: Diagnosis not present

## 2019-03-26 DIAGNOSIS — Z6825 Body mass index (BMI) 25.0-25.9, adult: Secondary | ICD-10-CM | POA: Diagnosis not present

## 2019-03-26 DIAGNOSIS — N838 Other noninflammatory disorders of ovary, fallopian tube and broad ligament: Secondary | ICD-10-CM | POA: Diagnosis not present

## 2019-03-26 DIAGNOSIS — C569 Malignant neoplasm of unspecified ovary: Secondary | ICD-10-CM | POA: Diagnosis not present

## 2019-03-27 ENCOUNTER — Other Ambulatory Visit: Payer: Self-pay

## 2019-03-27 ENCOUNTER — Ambulatory Visit
Admission: RE | Admit: 2019-03-27 | Discharge: 2019-03-27 | Disposition: A | Discharge status: Home / Self Care | Payer: Self-pay | Source: Ambulatory Visit | Attending: Gynecologic Oncology | Admitting: Gynecologic Oncology

## 2019-03-27 DIAGNOSIS — N838 Other noninflammatory disorders of ovary, fallopian tube and broad ligament: Secondary | ICD-10-CM

## 2019-03-28 ENCOUNTER — Telehealth: Payer: Self-pay | Admitting: *Deleted

## 2019-03-28 ENCOUNTER — Telehealth: Payer: Self-pay | Admitting: Cardiology

## 2019-03-28 NOTE — Telephone Encounter (Signed)
-----   Message from Berniece Salines, DO sent at 03/27/2019 11:16 PM EST ----- Please see if the patient can see me earlier in the next 2 weeks to discuss her monitor results.

## 2019-03-28 NOTE — Telephone Encounter (Signed)
Called the patient and scheduled a new patient appt on 2/24 at 10:45. Explained the policy for parking, visitors and masks. Also explained that she will have a pelvic exam that day.    Order in and called [facility] to power share CT from [facility]

## 2019-03-28 NOTE — Telephone Encounter (Signed)
Patient informed. Copy sent to PCP °

## 2019-03-28 NOTE — Telephone Encounter (Signed)
Daughter in law called to inform office that patient may need a procedure or surgery for possible ovarian cancer that is still in the process of being worked up for confirmation. Daughter in Law wanted to make sure cardiology was aware and also says that patient may need cardiac clearance or permission to hold eliquis. Advised that patient's provider would be informed and advised that if patient is needing cardiac clearance or has questions about holding eliquis before procedure/surgery, that the surgeon or provider's office doing the the procedure should send the request to our office. Verbalized understanding.

## 2019-03-28 NOTE — Telephone Encounter (Signed)
New message   Per daughter-in-law has additional information about a medical condition. Please call to discuss.

## 2019-03-29 ENCOUNTER — Encounter: Payer: Self-pay | Admitting: *Deleted

## 2019-04-02 ENCOUNTER — Telehealth: Payer: Self-pay

## 2019-04-02 NOTE — Telephone Encounter (Signed)
ENCOUNTER OPENED IN ERROR

## 2019-04-02 NOTE — Progress Notes (Signed)
GYNECOLOGIC ONCOLOGY NEW PATIENT CONSULTATION   Patient Name: Whitney Floyd  Patient Age: 85 y.o. Date of Service: 04/03/19 Referring Provider: Ernestene Kiel, MD [address] [place],  [place] [address]   Primary Care Provider: Ernestene Kiel, MD Consulting Provider: Jeral Pinch, MD   Assessment/Plan:  85 year old female with peritoneal and omental disease consistent with metastatic cancer in the setting of an elevated CA-125 and small adnexal mass suspicious for gynecologic primary.  I reviewed the patient's recent CT scan with her.  Given constellation of symptoms, elevated tumor marker and CT findings, I am concerned that she has a gynecologic malignancy.  Without tissue biopsy, though, the patient understands that we cannot make a definitive diagnosis.  We discussed the strategies for treatment of ovarian cancer which include upfront surgery followed by adjuvant chemotherapy versus neoadjuvant chemotherapy followed by interval debulking surgery and adjuvant chemotherapy.  This decision is made after consideration of patient's comorbidities, surgical fitness, and extent of disease.  I explained that the goal of surgery is to debulk 2 minimal or no residual disease.  Given findings on her abdominal and pelvic CT scan, I am concerned that I would not be able to resect all areas of cancer and, at this time given what I know, would recommend proceeding with neoadjuvant chemotherapy.  We reviewed that the next step in determining a treatment plan is biopsy to confirm pathology and origin of the carcinomatosis.  Given her large omental cake that is palpable on abdominal exam, I think it will be feasible for interventional radiology to percutaneously biopsy this.  An order was placed for CT-guided biopsy today although they may be able to do it under ultrasound guidance.  Additionally, given the extent of intra-abdominal disease, I have recommended that we also get a CT of her chest to rule  out pulmonary metastases.  Patient has follow-up with her cardiologist next week.  I think at this point, she does not need surgical clearance as I suspect it will be at least several months before we consider operative management.  A copy of this note was sent to the patient's referring provider.   55 minutes of total time was spent for this patient encounter, including preparation, face-to-face counseling with the patient and coordination of care, and documentation of the encounter.   Whitney Pinch, MD  Division of Gynecologic Oncology  Department of Obstetrics and Gynecology  [facility]  ___________________________________________  Chief Complaint: Chief Complaint  Patient presents with  . Ovarian mass, left    New Patient  . Elevated CA-125  . Carcinomatosis ([place])    History of Present Illness:  Whitney Floyd is a 85 y.o. y.o. female who is seen in consultation at the request of Floyd, Whitney Racer, MD for an evaluation of significant peritoneal and omental disease in the setting of an elevated CA-125 and small adnexal mass concerning for gynecologic primary.  The patient was initially seen in the emergency department on 12/31 after she had a near syncopal event.  Her heart rate was in the 180s on presentation and she was in A. fib.  She converted to sinus rhythm with IV Cardizem.  An echo at that time showed a left ventricular ejection fraction of 55-60%.  CT angio of the chest noted aortic atherosclerosis but no pulmonary embolism.  She was discharged on metoprolol and Eliquis.  She was seen by her primary care physician for follow-up on 1/8 and noted decreased energy, decreased appetite and reflux especially at  night.  Lab values were checked including TSH, T4, CBC and CMP.  No significant abnormal values noted.  The patient was seen again for follow-up at the beginning of February.  The patient endorsed having a difficult time eating anything dry  as well as having the urge to vomit if she presses on her epigastric region.  She has had some right upper abdominal pain for which she went to the emergency department and had an ultrasound of her liver performed and was told that nothing abnormal was seen.  She also endorsed some intermittent right lower abdominal pain.  She continued to endorse decreased energy as well as appetite.  Patient underwent CT scan showing a 3.6 cm complex left adnexal mass, polypoid gastric lesion and significant omental and peritoneal disease consistent with carcinomatosis.  Tumor markers notable for a normal CEA (3.1), normal Ca1 25 (13), and elevated Ca1 25 (392).  Labs were repeated and significant for mildly elevated ALT of 34, AST of 80, and decreased albumin at 3.1.  Today, she notes that about a year ago, she developed right sided sciatica.  She was seen for this and after doing exercises had improvement of her symptoms and was able to return to line dancing, which she enjoyed doing 3 times a week.  About 2 years ago, she notes having some right upper quadrant and flank pain.  She saw her primary care provider for this and ultimately had an ultrasound that she reports showed a gallbladder polyp.  She was seen by a surgeon and states that no surgical intervention was recommended.  She has continued to have intermittent pain on the right side.  Proximately 3 weeks ago, she had a severe episode of pain that "felt like a knife cutting" her.  The next morning, she went to [facility] emergency department where she was seen, had an upper quadrant ultrasound that she was told was normal.  She had improvement of her pain over the next 2 to 3 days.  In terms of her atrial fibrillation symptoms, she continues to notice intermittent palpitations and related shortness of breath, which she says happens most frequently in the morning.  These episodes, she endorses having difficulty breathing and pain in her right shoulder.  She also has  recently developed right lower quadrant pain, pain with urination and bowel function.  Since she started Eliquis, she reports diarrhea and flatus but no solid stool (on average 3 bowel movements a day).  She also endorses early satiety, and difficulty eating certain foods which she describes as feeling like the food expands in her mouth and ultimately she has to spit it out.  She has an appointment with her cardiologist next Wednesday.  PAST MEDICAL HISTORY:  Past Medical History:  Diagnosis Date  . Anemia   . Aortic atherosclerosis ([place])   . Arthritis   . Atrial fibrillation ([place])   . 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 Carcinomatosis ([place])   . Cataract   . Diverticulosis   . GERD (gastroesophageal reflux disease)   . Hyperlipidemia   . Osteopenia   . Paroxysmal A-fib ([place]) 02/08/2019  . Peptic ulcer disease      PAST SURGICAL HISTORY:  Past Surgical History:  Procedure Laterality Date  . benign bone tumor removal    . CATARACT EXTRACTION, BILATERAL    . TUBAL LIGATION      OB/GYN HISTORY:  OB History  Gravida Para Term Preterm AB Living  6 6  SAB TAB Ectopic Multiple Live Births               # Outcome Date GA Lbr Len/2nd Weight Sex Delivery Anes PTL Lv  6 Para           5 Para           4 Para           3 Para           2 Para           1 Para             No LMP recorded.  Age at menarche: 21 Age at menopause: Does not remember when her menses ceased, she notes menopause in her early to mid 76s that she was on hormone replacement therapy with Premarin and a progesterone for some time Hx of HRT: Yes Hx of STDs: Denies Last pap: At approximately age 52 History of abnormal pap smears: Denies  SCREENING STUDIES:  Last mammogram: Around the age of 27, normal  Last colonoscopy: Around the age of 29, normal  MEDICATIONS: Outpatient Encounter Medications as of 04/03/2019  Medication Sig  . Cholecalciferol 25 MCG (1000 UT) capsule Take 1,000  Units by mouth daily.  Verneita Griffes Bark POWD 500 mg by Does not apply route daily.  . Cranberry-Vitamin C-Probiotic (AZO CRANBERRY) 250-30 MG TABS Take by mouth 2 (two) times daily.  Marland Kitchen ELIQUIS 5 MG TABS tablet Take 5 mg by mouth 2 (two) times daily.  . metoprolol succinate (TOPROL-XL) 25 MG 24 hr tablet Take 25 mg by mouth daily.  Marland Kitchen omeprazole (PRILOSEC) 20 MG capsule Take 20 mg by mouth daily.   No facility-administered encounter medications on file as of 04/03/2019.    ALLERGIES:  Allergies  Allergen Reactions  . Codeine Sulfate [Codeine] Nausea And Vomiting  . Tetanus Toxoids Hives  . Horse-Derived Products Hives and Rash  . Sulfa Antibiotics Hives and Rash     FAMILY HISTORY:  Family History  Problem Relation Age of Onset  . Diabetes Mother   . Dementia Mother   . Other Mother        Had large benign ovarian mass removed surgically  . Arrhythmia Father   . Stroke Father   . Ovarian cancer Paternal Grandmother        Thinks that she died relatively young  . Breast cancer Paternal Aunt        Died in her 64s or 24s  . Colon cancer Neg Hx   . Uterine cancer Neg Hx   . Prostate cancer Neg Hx      SOCIAL HISTORY:    Social Connections:   . Frequency of Communication with Friends and Family: Not on file  . Frequency of Social Gatherings with Friends and Family: Not on file  . Attends Religious Services: Not on file  . Active Member of Clubs or Organizations: Not on file  . Attends Archivist Meetings: Not on file  . Marital Status: Not on file    REVIEW OF SYSTEMS:  Positive for decreased appetite, hearing loss, shortness of breath, chest pain, leg swelling, abdominal distention, abdominal pain, diarrhea, early satiety, dysuria, urinary frequency, hot flashes, pelvic pain, and anxiety. Denies fevers, chills, fatigue, unexplained weight changes. Denies neck lumps or masses, mouth sores, ringing in ears or voice changes. Denies cough or wheezing.  Denies chest  pain or palpitations.  Denies abdominal pain, blood in stools, constipation, nausea, vomiting. Denies  pain with intercourse, hematuria or incontinence. Denies vaginal bleeding or vaginal discharge.   Denies joint pain, back pain or muscle pain/cramps. Denies itching, rash, or wounds. Denies dizziness, headaches, numbness or seizures. Denies swollen lymph nodes or glands, denies easy bruising or bleeding. Denies depression, confusion, or decreased concentration.  Physical Exam:  Vital Signs for this encounter:  Blood pressure 132/73, pulse 90, temperature 98.3 F (36.8 C), temperature source Temporal, resp. rate 17, height 5\' 2"  (1.575 m), weight 130 lb 6.4 oz (59.1 kg), SpO2 100 %. Body mass index is 23.85 kg/m. General: Alert, oriented, no acute distress.  HEENT: Normocephalic, atraumatic. Sclera anicteric.  Chest: Clear to auscultation bilaterally. No wheezes, rhonchi, or rales. Cardiovascular: Regular rate and rhythm, no murmurs, rubs, or gallops.  Abdomen: Normoactive bowel sounds. Soft, mildly distended, nontender to palpation. No hepatosplenomegaly appreciated.  Firmness in the left upper quadrant and flank consistent with omental cake.  No palpable fluid wave.  Extremities: Grossly normal range of motion. Warm, well perfused. No edema bilaterally.  Skin: No rashes or lesions.  Lymphatics: No cervical, supraclavicular, or inguinal adenopathy.  GU:  Normal external female genitalia although significantly atrophic. No lesions. No discharge or bleeding.             Bladder/urethra:  No lesions or masses, well supported bladder             Vagina: Significantly narrowed introitus secondary to atrophy.  Unable to perform speculum exam.             Cervix: On palpation, cervix small and without lesions.             Uterus: Small, mobile, no parametrial involvement or nodularity.             Adnexa: No masses palpable.  Rectal: On rectovaginal exam, some minimal nodularity noted at the  apex of the rectovaginal septum.  LABORATORY AND RADIOLOGIC DATA:  Outside medical records were reviewed to synthesize the above history, along with the history and physical obtained during the visit.   Lab Results  Component Value Date   WBC 7.0 03/04/2019   HGB 13.5 03/04/2019   HCT 40.9 03/04/2019   PLT 329 03/04/2019   GLUCOSE 106 (H) 03/04/2019   ALT 25 03/04/2019   AST 60 (H) 03/04/2019   NA 137 03/04/2019   K 4.4 03/04/2019   CL 102 03/04/2019   CREATININE 0.92 03/04/2019   BUN 11 03/04/2019   CO2 22 03/04/2019   TSH 2.580 03/04/2019   CT abdomen/pelvis with contrast on 2/1: Bulky omental and peritoneal soft tissue disease.  Lymphoma could have this appearance but metastatic disease is suspected.  No definitive primary source identified.  There is a 3.6 x 3.1 x 2.2 cm complex cystic structure noted in the left adnexal space suggesting a possible ovarian etiology.  The polypoid lesion 2.8 cm in the gastric fundus more suggestive of food than a mass lesion.  Trace ascites. Performed at [facility] radiology.

## 2019-04-03 ENCOUNTER — Encounter: Payer: Self-pay | Admitting: Gynecologic Oncology

## 2019-04-03 ENCOUNTER — Inpatient Hospital Stay: Payer: Medicare HMO | Attending: Gynecologic Oncology | Admitting: Gynecologic Oncology

## 2019-04-03 ENCOUNTER — Encounter: Payer: Self-pay | Admitting: Oncology

## 2019-04-03 ENCOUNTER — Telehealth: Payer: Self-pay | Admitting: Cardiology

## 2019-04-03 ENCOUNTER — Encounter (HOSPITAL_COMMUNITY): Payer: Self-pay | Admitting: Radiology

## 2019-04-03 ENCOUNTER — Telehealth: Payer: Self-pay

## 2019-04-03 ENCOUNTER — Other Ambulatory Visit: Payer: Self-pay

## 2019-04-03 VITALS — BP 132/73 | HR 90 | Temp 98.3°F | Resp 17 | Ht 62.0 in | Wt 130.4 lb

## 2019-04-03 DIAGNOSIS — R309 Painful micturition, unspecified: Secondary | ICD-10-CM | POA: Insufficient documentation

## 2019-04-03 DIAGNOSIS — R1031 Right lower quadrant pain: Secondary | ICD-10-CM | POA: Insufficient documentation

## 2019-04-03 DIAGNOSIS — C800 Disseminated malignant neoplasm, unspecified: Secondary | ICD-10-CM | POA: Diagnosis not present

## 2019-04-03 DIAGNOSIS — Z79899 Other long term (current) drug therapy: Secondary | ICD-10-CM | POA: Insufficient documentation

## 2019-04-03 DIAGNOSIS — C801 Malignant (primary) neoplasm, unspecified: Secondary | ICD-10-CM | POA: Diagnosis not present

## 2019-04-03 DIAGNOSIS — I480 Paroxysmal atrial fibrillation: Secondary | ICD-10-CM | POA: Insufficient documentation

## 2019-04-03 DIAGNOSIS — R6881 Early satiety: Secondary | ICD-10-CM | POA: Diagnosis not present

## 2019-04-03 DIAGNOSIS — M8580 Other specified disorders of bone density and structure, unspecified site: Secondary | ICD-10-CM | POA: Insufficient documentation

## 2019-04-03 DIAGNOSIS — Z833 Family history of diabetes mellitus: Secondary | ICD-10-CM | POA: Insufficient documentation

## 2019-04-03 DIAGNOSIS — Z803 Family history of malignant neoplasm of breast: Secondary | ICD-10-CM | POA: Diagnosis not present

## 2019-04-03 DIAGNOSIS — Z8241 Family history of sudden cardiac death: Secondary | ICD-10-CM | POA: Insufficient documentation

## 2019-04-03 DIAGNOSIS — Z7901 Long term (current) use of anticoagulants: Secondary | ICD-10-CM | POA: Insufficient documentation

## 2019-04-03 DIAGNOSIS — C786 Secondary malignant neoplasm of retroperitoneum and peritoneum: Secondary | ICD-10-CM | POA: Diagnosis not present

## 2019-04-03 DIAGNOSIS — R971 Elevated cancer antigen 125 [CA 125]: Secondary | ICD-10-CM | POA: Insufficient documentation

## 2019-04-03 DIAGNOSIS — Z823 Family history of stroke: Secondary | ICD-10-CM | POA: Diagnosis not present

## 2019-04-03 DIAGNOSIS — N838 Other noninflammatory disorders of ovary, fallopian tube and broad ligament: Secondary | ICD-10-CM

## 2019-04-03 DIAGNOSIS — C562 Malignant neoplasm of left ovary: Secondary | ICD-10-CM | POA: Insufficient documentation

## 2019-04-03 DIAGNOSIS — E785 Hyperlipidemia, unspecified: Secondary | ICD-10-CM | POA: Diagnosis not present

## 2019-04-03 NOTE — Progress Notes (Signed)
Met with Bartolo Darter after her visit with Dr. Berline Lopes.  Provided her with my business card and encouraged her to call with any questions.

## 2019-04-03 NOTE — Progress Notes (Signed)
Kairy B. Swierczynski Female, 85 y.o., 1934-02-19 MRN:  [id number] Phone:  [phone] (H) ... PCP:  Ernestene Kiel, MD Coverage:  [place] [employer]na Medicare Hmo/Ppo Next Appt With Radiology ([facility]-CT 1) 04/11/2019 at 9:00 AM  RE: CT Biopsy Received: Today Message Contents  Lafonda Mosses, MD  Heidi Dach, Kardie, DO  Fine from my standpoint - Dr. Godfrey Pick, is it ok for this patient to hold anti-coagulation for a biopsy to help determine origin of her metastatic disease?  Thanks!       Previous Messages   ----- Message -----  From: Garth Bigness D  Sent: 04/03/2019 12:25 PM EST  To: Lafonda Mosses, MD  Subject: FW: CT Biopsy                   Patient on Eliquis will need to hold 2 days prior to Biopsy. Is it okay to hold Eliquis?  ----- Message -----  From: Garth Bigness D  Sent: 04/03/2019 12:21 PM EST  To: Ir Procedure Requests  Subject: CT Biopsy                     Procedure:  CT Biopsy   Reason: carcinomatosis, elevated CA-125   CT abdomen/pelvis with contrast on 2/1:  Bulky omental and peritoneal soft tissue disease. Lymphoma could have this appearance but metastatic disease is suspected. No definitive primary source identified. There is a 3.6 x 3.1 x 2.2 cm complex cystic structure noted in the left adnexal space suggesting a possible ovarian etiology. The polypoid lesion 2.8 cm in the gastric fundus more suggestive of food than a mass lesion. Trace ascites.  Performed at [facility] radiology.    History: Outside imaging uploaded, CT Abd/Pel done at [facility], CT Chest scheduled for 04/11/19   Provider: Lafonda Mosses   Provider Contact: [phone]

## 2019-04-03 NOTE — Telephone Encounter (Signed)
      [facility] Pre-operative Risk Assessment    Request for surgical clearance:  1. What type of surgery is being performed? CT guided omental biopsy   2. When is this surgery scheduled? TBD, preferably within 1-2 weeks  3. What type of clearance is required (medical clearance vs. Pharmacy clearance to hold med vs. Both)? pharmacy  4. Are there any medications that need to be held prior to surgery and how long? eliquis 2 days  5. Practice name and name of physician performing surgery? [facility], Dr. Berline Lopes  6. What is your office phone number: [phone]   7.   What is your office fax number: [phone]  8.   Anesthesia type (None, local, MAC, general) ? Not sure   Annabell Sabal 04/03/2019, 3:44 PM  _________________________________________________________________   (provider comments below)

## 2019-04-03 NOTE — Progress Notes (Signed)
Jamilynn B. Szott Female, 85 y.o., Apr 24, 1934 MRN:  [id number] Phone:  [phone] (H) ... PCP:  Ernestene Kiel, MD Coverage:  [place] Medicare/[employer]etna Medicare Hmo/Ppo Next Appt With Cardiology 04/17/2019 at 11:20 AM  RE: CT Biopsy Received: Today Message Contents  Corrie Mckusick, DO  Garth Bigness D  OK for CT guided omental mass.   Best target likely the left omental mass, 51/92 series 2 of outside CT.   Earleen Newport       Previous Messages   ----- Message -----  From: Garth Bigness D  Sent: 04/03/2019 12:21 PM EST  To: Ir Procedure Requests  Subject: CT Biopsy                     Procedure:  CT Biopsy   Reason: carcinomatosis, elevated CA-125   CT abdomen/pelvis with contrast on 2/1:  Bulky omental and peritoneal soft tissue disease. Lymphoma could have this appearance but metastatic disease is suspected. No definitive primary source identified. There is a 3.6 x 3.1 x 2.2 cm complex cystic structure noted in the left adnexal space suggesting a possible ovarian etiology. The polypoid lesion 2.8 cm in the gastric fundus more suggestive of food than a mass lesion. Trace ascites.  Performed at [facility] radiology.    History: Outside imaging uploaded, CT Abd/Pel done at [facility], CT Chest scheduled for 04/11/19   Provider: Lafonda Mosses   Provider Contact: [phone]

## 2019-04-03 NOTE — Progress Notes (Signed)
Whitney Floyd Female, 85 y.o., 10-01-1934 MRN:  [id number] Phone:  [phone] (H) ... PCP:  Ernestene Kiel, MD Coverage:  [place] Medicare/Aetna Medicare Hmo/Ppo Next Appt With Radiology ([facility]-CT 1) 04/11/2019 at 9:00 AM  RE: CT Biopsy Received: Today Message Contents  Tobb, Godfrey Pick, DO  Lafonda Mosses, MD; Fonda Kinder, RN  Hello,  She is on Eliquis, it is ok to hold for two days prior to biopsy. Please have her resume as soon as possible at the discretion of the surgeon or physician performing the procedure.   Dr. Harriet Masson       Previous Messages   ----- Message -----  From: Lafonda Mosses, MD  Sent: 04/03/2019 12:44 PM EST  To: Darrall Dears, DO  Subject: RE: CT Biopsy                   Fine from my standpoint - Dr. Godfrey Pick, is it ok for this patient to hold anti-coagulation for a biopsy to help determine origin of her metastatic disease?  Thanks!  ----- Message -----  From: Garth Bigness D  Sent: 04/03/2019 12:25 PM EST  To: Lafonda Mosses, MD  Subject: FW: CT Biopsy                   Patient on Eliquis will need to hold 2 days prior to Biopsy. Is it okay to hold Eliquis?  ----- Message -----  From: Garth Bigness D  Sent: 04/03/2019 12:21 PM EST  To: Ir Procedure Requests  Subject: CT Biopsy                     Procedure:  CT Biopsy   Reason: carcinomatosis, elevated CA-125   CT abdomen/pelvis with contrast on 2/1:  Bulky omental and peritoneal soft tissue disease. Lymphoma could have this appearance but metastatic disease is suspected. No definitive primary source identified. There is a 3.6 x 3.1 x 2.2 cm complex cystic structure noted in the left adnexal space suggesting a possible ovarian etiology. The polypoid lesion 2.8 cm in the gastric fundus more suggestive of food than a mass lesion. Trace ascites.  Performed at [facility] radiology.     History: Outside imaging uploaded, CT Abd/Pel done at [facility], CT Chest scheduled for 04/11/19   Provider: Lafonda Mosses   Provider Contact: [phone]

## 2019-04-03 NOTE — Telephone Encounter (Signed)
Patient's daughter in law called to inform us that patient had a CT Chest at [facility] on 02/07/19.  DIL wanted to know if another scan was necessary so soon.  Per MD, no scan needed at this time, ok to cancel.  DIL voiced understanding of above.

## 2019-04-03 NOTE — Telephone Encounter (Signed)
Spoke with Selina at Dr. Terrial Rhodes office regarding patient holding eliquis dose (5 mg bid) for two days prior to CT guided omental biopsy - date to be determined.  Selina to forward message to their pre op team, will contact this office with recommendation.

## 2019-04-03 NOTE — Telephone Encounter (Signed)
Pharm please address eliquis thanks 

## 2019-04-03 NOTE — Patient Instructions (Signed)
It was a pleasure meeting you today.  I will call you with the results of your chest imaging as well as once we have the pathology results from your biopsy.  As we discussed today, given the extent of disease, I suspect that we will be proceeding with systemic treatment (chemotherapy) first.  Please call our clinic if you have any questions or concerns at [phone].

## 2019-04-03 NOTE — Telephone Encounter (Signed)
I spoke with Butch Penny who is calling to let Dr Harriet Masson know patient has been diagnosed with cancer of the ovary and has a stomach mass.  Will need biopsy. Also may need chemotherapy

## 2019-04-03 NOTE — Telephone Encounter (Signed)
Patient's daughter-in-law Aarian Burfeind calling to let Dr. Harriet Masson know the patient found out she has ovarian cancer and that she may have abdominal cancer. She states she would like a nurse to call her or the patient back so they can give an update on the patient.

## 2019-04-04 ENCOUNTER — Other Ambulatory Visit: Payer: Self-pay

## 2019-04-04 ENCOUNTER — Telehealth: Payer: Self-pay | Admitting: *Deleted

## 2019-04-04 ENCOUNTER — Ambulatory Visit
Admission: RE | Admit: 2019-04-04 | Discharge: 2019-04-04 | Disposition: A | Discharge status: Home / Self Care | Payer: Self-pay | Source: Ambulatory Visit | Attending: Gynecologic Oncology | Admitting: Gynecologic Oncology

## 2019-04-04 DIAGNOSIS — Z7901 Long term (current) use of anticoagulants: Secondary | ICD-10-CM | POA: Diagnosis not present

## 2019-04-04 DIAGNOSIS — Z79899 Other long term (current) drug therapy: Secondary | ICD-10-CM | POA: Diagnosis not present

## 2019-04-04 DIAGNOSIS — R079 Chest pain, unspecified: Secondary | ICD-10-CM | POA: Diagnosis not present

## 2019-04-04 DIAGNOSIS — N838 Other noninflammatory disorders of ovary, fallopian tube and broad ligament: Secondary | ICD-10-CM

## 2019-04-04 DIAGNOSIS — R42 Dizziness and giddiness: Secondary | ICD-10-CM | POA: Diagnosis not present

## 2019-04-04 DIAGNOSIS — R748 Abnormal levels of other serum enzymes: Secondary | ICD-10-CM | POA: Diagnosis not present

## 2019-04-04 DIAGNOSIS — K219 Gastro-esophageal reflux disease without esophagitis: Secondary | ICD-10-CM | POA: Diagnosis not present

## 2019-04-04 DIAGNOSIS — I4891 Unspecified atrial fibrillation: Secondary | ICD-10-CM | POA: Diagnosis not present

## 2019-04-04 DIAGNOSIS — R0602 Shortness of breath: Secondary | ICD-10-CM | POA: Diagnosis not present

## 2019-04-04 NOTE — Telephone Encounter (Signed)
I did get a message about her biopsy. The patient may hold her Eliquis for 2 days prior to her biopsy. She may resume her Eliquis at soon as possible at the discretion of the physician performing the procedure.

## 2019-04-04 NOTE — Telephone Encounter (Signed)
Rockne Menghini, RPH-CPP  Pharmacist  Specialty:  Pharmacist  Telephone Encounter  Signed  Encounter Date:  04/03/2019          Signed         Show:Clear all [x] Manual[x] Template[] Copied  Added by: [x] Alvstad, Casimiro Needle, RPH-CPP  [] Hover for details Patient with diagnosis of atrial fibrillation on Eliquis for anticoagulation.    Procedure: CT guided omental biopsy Date of procedure: TBD  CHADS2-VASc score of  3 (AGE x 2, female)  CrCl 44 Platelet count 329  Per office protocol, patient can hold Eliquis for 2 days prior to procedure.    Patient will not need bridging with Lovenox (enoxaparin) around procedure.          Electronically signed by Rockne Menghini, RPH-CPP at 04/04/2019 8:12 AM   Telephone on 04/03/2019     Detailed Report

## 2019-04-04 NOTE — Telephone Encounter (Signed)
Patient with diagnosis of atrial fibrillation on Eliquis for anticoagulation.    Procedure: CT guided omental biopsy Date of procedure: TBD  CHADS2-VASc score of  3 (AGE x 2, female)  CrCl 44 Platelet count 329  Per office protocol, patient can hold Eliquis for 2 days prior to procedure.    Patient will not need bridging with Lovenox (enoxaparin) around procedure.

## 2019-04-04 NOTE — Telephone Encounter (Signed)
Told  Whitney Floyd that Dr.Tobb's office said that it would be fine to hold her Eliquis 2 days prior to her biopsy on 04-11-19 as noted below by [name] which would be hold beginning 04-09-19. Pt verbalized understanding.

## 2019-04-04 NOTE — Telephone Encounter (Signed)
Called and left a message with canopy to upload the CT chest from [facility] on 02/07/2019

## 2019-04-05 ENCOUNTER — Telehealth: Payer: Self-pay | Admitting: Cardiology

## 2019-04-05 DIAGNOSIS — I4891 Unspecified atrial fibrillation: Secondary | ICD-10-CM | POA: Diagnosis not present

## 2019-04-05 DIAGNOSIS — K219 Gastro-esophageal reflux disease without esophagitis: Secondary | ICD-10-CM | POA: Diagnosis not present

## 2019-04-05 NOTE — Telephone Encounter (Signed)
Patient's daughter in law, Butch Penny, states the patient has been in [facility] due to an increase in her HR. Butch Penny states the patient has been advised to switch from metoprolol succinate (TOPROL-XL) 25 MG to Cardizem medication.   Please return call to Butch Penny and advise at [phone].

## 2019-04-09 ENCOUNTER — Other Ambulatory Visit: Payer: Self-pay | Admitting: Radiology

## 2019-04-09 DIAGNOSIS — C562 Malignant neoplasm of left ovary: Secondary | ICD-10-CM | POA: Diagnosis not present

## 2019-04-09 DIAGNOSIS — R634 Abnormal weight loss: Secondary | ICD-10-CM | POA: Diagnosis not present

## 2019-04-09 DIAGNOSIS — Z6824 Body mass index (BMI) 24.0-24.9, adult: Secondary | ICD-10-CM | POA: Diagnosis not present

## 2019-04-09 DIAGNOSIS — I4891 Unspecified atrial fibrillation: Secondary | ICD-10-CM | POA: Diagnosis not present

## 2019-04-10 NOTE — Telephone Encounter (Signed)
That is fine 

## 2019-04-11 ENCOUNTER — Other Ambulatory Visit: Payer: Self-pay

## 2019-04-11 ENCOUNTER — Encounter (HOSPITAL_COMMUNITY): Payer: Self-pay

## 2019-04-11 ENCOUNTER — Ambulatory Visit (HOSPITAL_COMMUNITY): Payer: Medicare HMO

## 2019-04-11 ENCOUNTER — Ambulatory Visit (HOSPITAL_COMMUNITY)
Admission: RE | Admit: 2019-04-11 | Discharge: 2019-04-11 | Disposition: A | Discharge status: Home / Self Care | Payer: Medicare HMO | Source: Ambulatory Visit | Attending: Gynecologic Oncology | Admitting: Gynecologic Oncology

## 2019-04-11 DIAGNOSIS — E785 Hyperlipidemia, unspecified: Secondary | ICD-10-CM | POA: Diagnosis not present

## 2019-04-11 DIAGNOSIS — K219 Gastro-esophageal reflux disease without esophagitis: Secondary | ICD-10-CM | POA: Diagnosis not present

## 2019-04-11 DIAGNOSIS — R1084 Generalized abdominal pain: Secondary | ICD-10-CM | POA: Insufficient documentation

## 2019-04-11 DIAGNOSIS — I480 Paroxysmal atrial fibrillation: Secondary | ICD-10-CM | POA: Diagnosis not present

## 2019-04-11 DIAGNOSIS — M8580 Other specified disorders of bone density and structure, unspecified site: Secondary | ICD-10-CM | POA: Insufficient documentation

## 2019-04-11 DIAGNOSIS — Z7901 Long term (current) use of anticoagulants: Secondary | ICD-10-CM | POA: Insufficient documentation

## 2019-04-11 DIAGNOSIS — Z79899 Other long term (current) drug therapy: Secondary | ICD-10-CM | POA: Insufficient documentation

## 2019-04-11 DIAGNOSIS — R971 Elevated cancer antigen 125 [CA 125]: Secondary | ICD-10-CM | POA: Insufficient documentation

## 2019-04-11 DIAGNOSIS — R220 Localized swelling, mass and lump, head: Secondary | ICD-10-CM | POA: Insufficient documentation

## 2019-04-11 DIAGNOSIS — M1990 Unspecified osteoarthritis, unspecified site: Secondary | ICD-10-CM | POA: Diagnosis not present

## 2019-04-11 DIAGNOSIS — R180 Malignant ascites: Secondary | ICD-10-CM | POA: Diagnosis not present

## 2019-04-11 DIAGNOSIS — R188 Other ascites: Secondary | ICD-10-CM | POA: Insufficient documentation

## 2019-04-11 DIAGNOSIS — K668 Other specified disorders of peritoneum: Secondary | ICD-10-CM | POA: Diagnosis not present

## 2019-04-11 DIAGNOSIS — C801 Malignant (primary) neoplasm, unspecified: Secondary | ICD-10-CM | POA: Diagnosis not present

## 2019-04-11 DIAGNOSIS — C786 Secondary malignant neoplasm of retroperitoneum and peritoneum: Secondary | ICD-10-CM | POA: Diagnosis not present

## 2019-04-11 LAB — CBC WITH DIFFERENTIAL/PLATELET
Abs Immature Granulocytes: 0.03 10*3/uL (ref 0.00–0.07)
Basophils Absolute: 0.1 10*3/uL (ref 0.0–0.1)
Basophils Relative: 1 %
Eosinophils Absolute: 0.2 10*3/uL (ref 0.0–0.5)
Eosinophils Relative: 3 %
HCT: 38.9 % (ref 36.0–46.0)
Hemoglobin: 12.5 g/dL (ref 12.0–15.0)
Immature Granulocytes: 0 %
Lymphocytes Relative: 17 %
Lymphs Abs: 1.4 10*3/uL (ref 0.7–4.0)
MCH: 28.7 pg (ref 26.0–34.0)
MCHC: 32.1 g/dL (ref 30.0–36.0)
MCV: 89.4 fL (ref 80.0–100.0)
Monocytes Absolute: 0.7 10*3/uL (ref 0.1–1.0)
Monocytes Relative: 8 %
Neutro Abs: 5.7 10*3/uL (ref 1.7–7.7)
Neutrophils Relative %: 71 %
Platelets: 366 10*3/uL (ref 150–400)
RBC: 4.35 MIL/uL (ref 3.87–5.11)
RDW: 13.6 % (ref 11.5–15.5)
WBC: 8.1 10*3/uL (ref 4.0–10.5)
nRBC: 0 % (ref 0.0–0.2)

## 2019-04-11 LAB — PROTIME-INR
INR: 1 (ref 0.8–1.2)
Prothrombin Time: 12.9 seconds (ref 11.4–15.2)

## 2019-04-11 MED ORDER — MIDAZOLAM HCL 2 MG/2ML IJ SOLN
INTRAMUSCULAR | Status: AC | PRN
  Administered 2019-04-11 (×2): 1 mg via INTRAVENOUS

## 2019-04-11 MED ORDER — MIDAZOLAM HCL 2 MG/2ML IJ SOLN
INTRAMUSCULAR | Status: AC
  Filled 2019-04-11: qty 4

## 2019-04-11 MED ORDER — FENTANYL CITRATE (PF) 100 MCG/2ML IJ SOLN
INTRAMUSCULAR | Status: AC | PRN
  Administered 2019-04-11: 50 ug via INTRAVENOUS

## 2019-04-11 MED ORDER — LIDOCAINE HCL (PF) 1 % IJ SOLN
INTRAMUSCULAR | Status: AC | PRN
  Administered 2019-04-11: 10 mL

## 2019-04-11 MED ORDER — FENTANYL CITRATE (PF) 100 MCG/2ML IJ SOLN
INTRAMUSCULAR | Status: AC
  Filled 2019-04-11: qty 2

## 2019-04-11 MED ORDER — SODIUM CHLORIDE 0.9 % IV SOLN: INTRAVENOUS | Status: DC

## 2019-04-11 NOTE — Discharge Instructions (Signed)

## 2019-04-11 NOTE — H&P (Signed)
Chief Complaint: Patient was seen in consultation today for omental mass/biopsy.  Referring Physician(s): Tucker,Katherine R  Supervising Physician: Markus Daft  Patient Status: [facility] - Out-pt  History of Present Illness: Whitney Floyd is a 85 y.o. female with a past medical history of hyperlipidemia, paroxysmal atrial fibrillation on chronic anticoagulation with Eliquis, GERD, PUD, diverticulosis, anemia, osteopenia, arthritis, and cataracts. She has had intermittent generalized abdominal pain varying in severity for years. Her PCP ordered CT abdomen/pelvis for further evaluation which revealed bulky omental and peritoneal soft tissue disease, along with a complex cystic structure noted in the left adnexal space, suggestive of possible ovarian etiology. She was then referred to GYN oncology who recommended biopsy for tissue diagnosis.  CT abdomen/pelvis 03/11/2019: 1. Bulky omental and peritoneal soft tissue disease.  Lymphoma could have this appearance but metastatic disease is suspected.  No definitive primary source identified.  There is a 3.6 x 3.1 x 2.2 cm complex cystic structure noted in the left adnexal space suggesting a possible ovarian etiology.  The polypoid lesion 2.8 cm in the gastric fundus more suggestive of food than a mass lesion.  Trace ascites.  IR requested by Dr. Berline Lopes for possible image-guided omental mass biopsy. Patient awake and alert laying in bed. Complains of intermittent generalized abdominal pain x years, varying in severity. Denies abdominal pain at this time. Denies fever, chills, chest pain, dyspnea, or headache.  Currently taking Eliquis 5 mg twice daily- last dose 04/08/2019.   Past Medical History:  Diagnosis Date  . Anemia   . Aortic atherosclerosis ([place])   . Arthritis   . Atrial fibrillation ([place])   . 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 Carcinomatosis ([place])   . Cataract   . Diverticulosis   . GERD (gastroesophageal reflux disease)    . Hyperlipidemia   . Osteopenia   . Paroxysmal A-fib ([place]) 02/08/2019  . Peptic ulcer disease     Past Surgical History:  Procedure Laterality Date  . benign bone tumor removal    . CATARACT EXTRACTION, BILATERAL    . TUBAL LIGATION      Allergies: Codeine sulfate [codeine], Tetanus toxoids, Horse-derived products, and Sulfa antibiotics  Medications: Prior to Admission medications   Medication Sig Start Date End Date Taking? Authorizing Provider  diltiazem (CARDIZEM) 120 MG tablet Take 120 mg by mouth daily.   Yes [provider]  metoprolol succinate (TOPROL-XL) 25 MG 24 hr tablet Take 25 mg by mouth daily. 02/08/19  Yes [provider]  omeprazole (PRILOSEC) 20 MG capsule Take 20 mg by mouth daily.   Yes [provider]  Cholecalciferol 25 MCG (1000 UT) capsule Take 1,000 Units by mouth daily.    [provider]  Cinnamon Bark POWD 500 mg by Does not apply route daily.    [provider]  Cranberry-Vitamin C-Probiotic (AZO CRANBERRY) 250-30 MG TABS Take by mouth 2 (two) times daily.    [provider]  ELIQUIS 5 MG TABS tablet Take 5 mg by mouth 2 (two) times daily. 02/08/19   [provider]     Family History  Problem Relation Age of Onset  . Diabetes Mother   . Dementia Mother   . Other Mother        Had large benign ovarian mass removed surgically  . Arrhythmia Father   . Stroke Father   . Ovarian cancer Paternal Grandmother        Thinks that she died relatively young  .  Breast cancer Paternal Aunt        Died in her 62s or 39s  . Colon cancer Neg Hx   . Uterine cancer Neg Hx   . Prostate cancer Neg Hx     Social History   Socioeconomic History  . Marital status: Single    Spouse name: Not on file  . Number of children: Not on file  . Years of education: Not on file  . Highest education level: Not on file  Occupational History  . Not on file  Tobacco Use  . Smoking status: Never Smoker  .  Smokeless tobacco: Never Used  Substance and Sexual Activity  . Alcohol use: Never  . Drug use: Never  . Sexual activity: Not Currently  Other Topics Concern  . Not on file  Social History Narrative  . Not on file   Social Determinants of Health   Financial Resource Strain:   . Difficulty of Paying Living Expenses: Not on file  Food Insecurity:   . Worried About Charity fundraiser in the Last Year: Not on file  . Ran Out of Food in the Last Year: Not on file  Transportation Needs:   . Lack of Transportation (Medical): Not on file  . Lack of Transportation (Non-Medical): Not on file  Physical Activity:   . Days of Exercise per Week: Not on file  . Minutes of Exercise per Session: Not on file  Stress:   . Feeling of Stress : Not on file  Social Connections:   . Frequency of Communication with Friends and Family: Not on file  . Frequency of Social Gatherings with Friends and Family: Not on file  . Attends Religious Services: Not on file  . Active Member of Clubs or Organizations: Not on file  . Attends Archivist Meetings: Not on file  . Marital Status: Not on file     Review of Systems: A 12 point ROS discussed and pertinent positives are indicated in the HPI above.  All other systems are negative.  Review of Systems  Constitutional: Negative for chills and fever.  Respiratory: Negative for shortness of breath and wheezing.   Cardiovascular: Negative for chest pain and palpitations.  Gastrointestinal: Positive for abdominal pain.  Neurological: Negative for headaches.  Psychiatric/Behavioral: Negative for behavioral problems and confusion.    Vital Signs: Ht 5\' 2"  (1.575 m)   Wt 130 lb 4.7 oz (59.1 kg)   BMI 23.83 kg/m   Physical Exam Vitals and nursing note reviewed.  Constitutional:      General: She is not in acute distress.    Appearance: Normal appearance.  Cardiovascular:     Rate and Rhythm: Normal rate. Rhythm irregular.  Skin:    General:  Skin is warm and dry.  Neurological:     Mental Status: She is alert and oriented to person, place, and time.  Psychiatric:        Mood and Affect: Mood normal.        Behavior: Behavior normal.      MD Evaluation Airway: WNL Heart: WNL Abdomen: WNL Chest/ Lungs: WNL ASA  Classification: 3 Mallampati/Airway Score: Two   Imaging: No results found.  Labs:  CBC: Recent Labs    03/04/19 1107 04/11/19 0730  WBC 7.0 8.1  HGB 13.5 12.5  HCT 40.9 38.9  PLT 329 366    COAGS: Recent Labs    04/11/19 0730  INR 1.0    BMP: Recent Labs  03/04/19 1107  NA 137  K 4.4  CL 102  CO2 22  GLUCOSE 106*  BUN 11  CALCIUM 8.5*  CREATININE 0.92  GFRNONAA 58*  GFRAA 67    LIVER FUNCTION TESTS: Recent Labs    03/04/19 1107  BILITOT 0.2  AST 60*  ALT 25  ALKPHOS 69  PROT 7.4  ALBUMIN 3.6     Assessment and Plan:  Omental mass. Plan for image-guided omental mass biopsy today in IR. Patient is NPO. Afebrile and WBCs WNL. Last dose Eliquis 04/08/2019- ok to proceed per IR protocol. INR 1.0 today.  Risks and benefits discussed with the patient including, but not limited to bleeding, infection, damage to adjacent structures or low yield requiring additional tests. All of the patient's questions were answered, patient is agreeable to proceed. Consent signed and in chart.   Thank you for this interesting consult.  I greatly enjoyed meeting Whitney Floyd and look forward to participating in their care.  A copy of this report was sent to the requesting provider on this date.  Electronically Signed: Earley Abide, PA-C 04/11/2019, 8:17 AM   I spent a total of 30 Minutes in face to face in clinical consultation, greater than 50% of which was counseling/coordinating care for omental mass/biopsy.

## 2019-04-11 NOTE — Procedures (Signed)
Interventional Radiology Procedure:   Indications: Omental thickening and disease.  Procedure: CT and US guided core biopsy of omental disease.  Aspiration of peritoneal fluid.  Findings: Thick omental disease in left lateral abdomen.  4 cores obtained.  30 ml of red serous ascites aspirated and sent for cytology.  Complications: None     EBL: less than 10 ml  Plan: Bedrest 2 hours, then discharge to home.    Cinque Begley R. Anselm Pancoast, MD  Pager: [phone]

## 2019-04-12 NOTE — Telephone Encounter (Signed)
Spoke with Butch Penny. Informed her Dr Harriet Masson was fine with medication switch.

## 2019-04-15 LAB — CYTOLOGY - NON PAP

## 2019-04-16 ENCOUNTER — Telehealth: Payer: Self-pay | Admitting: Oncology

## 2019-04-16 ENCOUNTER — Telehealth: Payer: Self-pay | Admitting: Gynecologic Oncology

## 2019-04-16 DIAGNOSIS — C800 Disseminated malignant neoplasm, unspecified: Secondary | ICD-10-CM

## 2019-04-16 LAB — SURGICAL PATHOLOGY

## 2019-04-16 NOTE — Telephone Encounter (Signed)
Called patient with omental biopsy results, suggestive of Gyn primary. Given disease burden, nutritional status, and comorbidities, I think that NACT followed by consideration for IDS is more appropriate treatment plan. The patient is scheduled to see Dr. Alvy Bimler on Thursday. There is a discrepancy between ascites (high grade) and omental biopsy (more suggestive of low grade). If in fact low grade, tumor is likely to less responsive to chemotherapy.  Jeral Pinch MD Gynecologic Oncology

## 2019-04-16 NOTE — Telephone Encounter (Signed)
Called Whitney Floyd and discussed that we are still waiting on her biopsy results but that the fluid from the biopsy did show cancer cells.  Advised her that Dr. Berline Lopes would like to proceed with having her see Dr. Alvy Bimler with medical oncology.  Also asked her if she would like treatment in [place] and she said that she would.    Appointment scheduled with Dr. Alvy Bimler on 04/18/2019 at 1 pm with patient education at 4 pm (she is ok waiting for the education class). Her daughter will attend the appointments with her.

## 2019-04-17 ENCOUNTER — Encounter: Payer: Self-pay | Admitting: Cardiology

## 2019-04-17 ENCOUNTER — Ambulatory Visit: Payer: Medicare HMO | Admitting: Cardiology

## 2019-04-17 ENCOUNTER — Other Ambulatory Visit: Payer: Self-pay

## 2019-04-17 VITALS — BP 122/68 | HR 95 | Temp 97.7°F | Ht 62.0 in | Wt 134.8 lb

## 2019-04-17 DIAGNOSIS — I10 Essential (primary) hypertension: Secondary | ICD-10-CM

## 2019-04-17 DIAGNOSIS — I471 Supraventricular tachycardia: Secondary | ICD-10-CM

## 2019-04-17 DIAGNOSIS — I480 Paroxysmal atrial fibrillation: Secondary | ICD-10-CM

## 2019-04-17 DIAGNOSIS — C800 Disseminated malignant neoplasm, unspecified: Secondary | ICD-10-CM | POA: Diagnosis not present

## 2019-04-17 DIAGNOSIS — N838 Other noninflammatory disorders of ovary, fallopian tube and broad ligament: Secondary | ICD-10-CM | POA: Diagnosis not present

## 2019-04-17 MED ORDER — METOPROLOL SUCCINATE ER 25 MG PO TB24: 37.5000 mg | ORAL_TABLET | Freq: Every day | ORAL | 1 refills | Status: DC

## 2019-04-17 NOTE — Progress Notes (Signed)
Cardiology Office Note:    Date:  04/17/2019   ID:  Whitney Floyd, DOB 16-May-1934, MRN [id number]  PCP:  Ernestene Kiel, MD  Cardiologist:  Berniece Salines, DO  Electrophysiologist:  None   Referring MD: Ernestene Kiel, MD   Follow up for atrial fibrillation and monitor results  History of Present Illness:    Whitney Floyd is a 85 y.o. female with a hx of paroxysmal atrial fibrillation (on Eliquis-5 twice a day, metoprolol succinate 25 mg daily and Cardizem 20 mg daily.   I saw the patient on March 04, 2019 at that time she was posthospitalization and presented to establish cardiac care.  Conclusion her visit she reported that she was experiencing chest pain and shortness of breath therefore recommended she undergo nuclear stress test.  In addition I placed a ZIO monitor and the patient understanding the burden of atrial fibrillation.  In the interim patient has been diagnosed with ovarian cancer and is pending chemotherapy tomorrow.  I did get her pharmacologic nuclear stress test which was normal she did wear her ZIO monitor.   Past Medical History:  Diagnosis Date  . Anemia   . Aortic atherosclerosis ([place])   . Arthritis   . Atrial fibrillation ([place])   . 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 Carcinomatosis ([place])   . Cataract   . Diverticulosis   . GERD (gastroesophageal reflux disease)   . Hyperlipidemia   . Osteopenia   . Paroxysmal A-fib ([place]) 02/08/2019  . Peptic ulcer disease     Past Surgical History:  Procedure Laterality Date  . benign bone tumor removal    . CATARACT EXTRACTION, BILATERAL    . TUBAL LIGATION      Current Medications: Current Meds  Medication Sig  . diltiazem (CARDIZEM) 120 MG tablet Take 120 mg by mouth daily.  Marland Kitchen ELIQUIS 5 MG TABS tablet Take 5 mg by mouth 2 (two) times daily.  Marland Kitchen omeprazole (PRILOSEC) 20 MG capsule Take 20 mg by mouth 2 (two) times daily before a meal.   . [DISCONTINUED] metoprolol succinate  (TOPROL-XL) 25 MG 24 hr tablet Take 25 mg by mouth daily.     Allergies:   Codeine sulfate [codeine], Tetanus toxoids, Horse-derived products, and Sulfa antibiotics   Social History   Socioeconomic History  . Marital status: Single    Spouse name: Not on file  . Number of children: Not on file  . Years of education: Not on file  . Highest education level: Not on file  Occupational History  . Not on file  Tobacco Use  . Smoking status: Never Smoker  . Smokeless tobacco: Never Used  Substance and Sexual Activity  . Alcohol use: Never  . Drug use: Never  . Sexual activity: Not Currently  Other Topics Concern  . Not on file  Social History Narrative  . Not on file   Social Determinants of Health   Financial Resource Strain:   . Difficulty of Paying Living Expenses: Not on file  Food Insecurity:   . Worried About Charity fundraiser in the Last Year: Not on file  . Ran Out of Food in the Last Year: Not on file  Transportation Needs:   . Lack of Transportation (Medical): Not on file  . Lack of Transportation (Non-Medical): Not on file  Physical Activity:   . Days of Exercise per Week: Not on file  . Minutes of Exercise per Session: Not on file  Stress:   .  Feeling of Stress : Not on file  Social Connections:   . Frequency of Communication with Friends and Family: Not on file  . Frequency of Social Gatherings with Friends and Family: Not on file  . Attends Religious Services: Not on file  . Active Member of Clubs or Organizations: Not on file  . Attends Archivist Meetings: Not on file  . Marital Status: Not on file     Family History: The patient's family history includes Arrhythmia in her father; Breast cancer in her paternal aunt; Dementia in her mother; Diabetes in her mother; Other in her mother; Ovarian cancer in her paternal grandmother; Stroke in her father. There is no history of Colon cancer, Uterine cancer, or Prostate cancer.  ROS:   Review of  Systems  Constitution: Negative for decreased appetite, fever and weight gain.  HENT: Negative for congestion, ear discharge, hoarse voice and sore throat.   Eyes: Negative for discharge, redness, vision loss in right eye and visual halos.  Cardiovascular: Negative for chest pain, dyspnea on exertion, leg swelling, orthopnea and palpitations.  Respiratory: Negative for cough, hemoptysis, shortness of breath and snoring.   Endocrine: Negative for heat intolerance and polyphagia.  Hematologic/Lymphatic: Negative for bleeding problem. Does not bruise/bleed easily.  Skin: Negative for flushing, nail changes, rash and suspicious lesions.  Musculoskeletal: Negative for arthritis, joint pain, muscle cramps, myalgias, neck pain and stiffness.  Gastrointestinal: Negative for abdominal pain, bowel incontinence, diarrhea and excessive appetite.  Genitourinary: Negative for decreased libido, genital sores and incomplete emptying.  Neurological: Negative for brief paralysis, focal weakness, headaches and loss of balance.  Psychiatric/Behavioral: Negative for altered mental status, depression and suicidal ideas.  Allergic/Immunologic: Negative for HIV exposure and persistent infections.    EKGs/Labs/Other Studies Reviewed:    The following studies were reviewed today:   EKG:  The ekg ordered today demonstrates   Zio Monitor The patient wore the monitor for 6 days 10 hours starting March 04, 2019. Indication: Paroxysmal atrial fibrillation with dizziness  The minimum heart rate was 60 bpm, maximum heart rate was 211 bpm, and average heart rate was 78 bpm. Predominant underlying rhythm was Sinus Rhythm.  3% burden of atrial fibrillation.   Atrial Fibrillation occurred (3% burden), ranging from 101-211 bpm (average of 145 bpm), the longest lasting 3 hours 44 minutes with an avg rate of 145 bpm.   362 Supraventricular Tachycardia runs occurred, the run with the fastest interval lasting 7 beats  with a maximum rate of 187 bpm, the longest lasting 41.9 secs with an average rate of 122 bpm.   Premature atrial complexes were rare less than 1%. Premature Ventricular complexes were rare less than 1%.  No ventricular tachycardia, no pauses and no AV blocks were present.  7 patient triggered event noted: 1 associated with atrial fibrillation, 1 associated with paroxysmal atrial tachycardia, 3 associated with premature atrial complexes and remaining with sinus rhythm.  7 diary events all associated with premature atrial complexes.    Conclusion: This study is remarkable for symptomatic paroxysmal atrial fibrillation, symptomatic paroxysmal atrial tachycardia and symptomatic rare   Lexiscan  The left ventricular ejection fraction is hyperdynamic (>65%).  Nuclear stress EF: 83%.  There was no ST segment deviation noted during stress.  No T wave inversion was noted during stress.  The study is normal.  This is a low risk study.  [facility]ogram [facility] at [facility] on 02/08/2019 normal left systolic function visually estimate ejection fraction 85 to 60%.  Diastolic  filling pattern was normal for age.  Right ventricle is normal.  Right atrium normal.  Left atrium is normal.  Interatrial septum is intact.  Mild aortic valve sclerosis without stenosis.  No aortic regurgitation.  Trace mitral regurgitation.  No tricuspid regurgitation.  No pulmonic vegetation.  Aortic root, ascending aorta and aortic arch are normal in size.  No pericardial effusion.  CTA chest 02/07/2019 No evidence of pulmonary embolism.  No appreciation of thoracic aneurysm or dissection.  Mild aortic sclerosis.  No pleural effusion.  Aortic atherosclerosis.  Fluid in the region of the spleen presumably ascites.  Recent Labs: 03/04/2019: ALT 25; BUN 11; Creatinine, Ser 0.92; Magnesium 1.8; Potassium 4.4; Sodium 137; TSH 2.580 04/11/2019: Hemoglobin 12.5; Platelets 366  Recent Lipid Panel No results found for:  CHOL, TRIG, HDL, CHOLHDL, VLDL, LDLCALC, LDLDIRECT  Physical Exam:    VS:  BP 122/68   Pulse 95   Temp 97.7 F (36.5 C)   Ht 5\' 2"  (1.575 m)   Wt 134 lb 12.8 oz (61.1 kg)   SpO2 97%   BMI 24.66 kg/m     Wt Readings from Last 3 Encounters:  04/17/19 134 lb 12.8 oz (61.1 kg)  04/11/19 130 lb 4.7 oz (59.1 kg)  04/03/19 130 lb 6.4 oz (59.1 kg)     GEN: Well nourished, well developed in no acute distress HEENT: Normal NECK: No JVD; No carotid bruits LYMPHATICS: No lymphadenopathy CARDIAC: S1S2 noted,RRR, no murmurs, rubs, gallops RESPIRATORY:  Clear to auscultation without rales, wheezing or rhonchi  ABDOMEN: Soft, non-tender, non-distended, +bowel sounds, no guarding. EXTREMITIES: No edema, No cyanosis, no clubbing MUSCULOSKELETAL:  No deformity  SKIN: Warm and dry NEUROLOGIC:  Alert and oriented x 3, non-focal PSYCHIATRIC:  Normal affect, good insight  ASSESSMENT:    1. PAF (paroxysmal atrial fibrillation) ([place])   2. PAT (paroxysmal atrial tachycardia) ([place])   3. Ovarian mass, left   4. Carcinomatosis ([place])   5. Essential hypertension    PLAN:     I discussed the monitor with the patient telling her of 3% burden of paroxysmal atrial fibrillation her heart rate goes up into the 200s.  She feels that she tells me.  Going to increase her metoprolol to 37.5 mg daily, I have asked her to take this medication at nighttime.  Continue on her Cardizem 120 mg daily as well as her Eliquis 5 mg twice a day.  Blood pressure in the office today is acceptable.  She is planning her first session of chemotherapy tomorrow and is a bit nervous about this.   The patient is in agreement with the above plan. The patient left the office in stable condition.  The patient will follow up in 3 months or sooner if needed.   Medication Adjustments/Labs and Tests Ordered: Current medicines are reviewed at length with the patient today.  Concerns regarding medicines are outlined above.  No  orders of the defined types were placed in this encounter.  Meds ordered this encounter  Medications  . metoprolol succinate (TOPROL XL) 25 MG 24 hr tablet    Sig: Take 1.5 tablets (37.5 mg total) by mouth daily.    Dispense:  135 tablet    Refill:  1    Patient Instructions  Medication Instructions:  Your physician has recommended you make the following change in your medication:   INCREASE: Toprol XL(metoprolol succinate) 25 mg Take 1 and 1/2 tabs daily  *If you need a refill on your cardiac medications before your  next appointment, please call your pharmacy*   Lab Work: nOne If you have labs (blood work) drawn today and your tests are completely normal, you will receive your results only by: Marland Kitchen MyChart Message (if you have MyChart) OR . A paper copy in the mail If you have any lab test that is abnormal or we need to change your treatment, we will call you to review the results.   Testing/Procedures: None   Follow-Up: At [facility], you and your health needs are our priority.  As part of our continuing mission to provide you with exceptional heart care, we have created designated Provider Care Teams.  These Care Teams include your primary Cardiologist (physician) and Advanced Practice Providers (APPs -  Physician Assistants and Nurse Practitioners) who all work together to provide you with the care you need, when you need it.  We recommend signing up for the patient portal called "MyChart".  Sign up information is provided on this After Visit Summary.  MyChart is used to connect with patients for Virtual Visits (Telemedicine).  Patients are able to view lab/test results, encounter notes, upcoming appointments, etc.  Non-urgent messages can be sent to your provider as well.   To learn more about what you can do with MyChart, go to [email or web address].    Your next appointment:   3 month(s)  The format for your next appointment:   In Person  Provider:   Berniece Salines,  DO   Other Instructions      Adopting a Healthy Lifestyle.  Know what a healthy weight is for you (roughly BMI <25) and aim to maintain this   Aim for 7+ servings of fruits and vegetables daily   65-80+ fluid ounces of water or unsweet tea for healthy kidneys   Limit to max 1 drink of alcohol per day; avoid smoking/tobacco   Limit animal fats in diet for cholesterol and heart health - choose grass fed whenever available   Avoid highly processed foods, and foods high in saturated/trans fats   Aim for low stress - take time to unwind and care for your mental health   Aim for 150 min of moderate intensity exercise weekly for heart health, and weights twice weekly for bone health   Aim for 7-9 hours of sleep daily   When it comes to diets, agreement about the perfect plan isnt easy to find, even among the experts. Experts at the [employer] developed an idea known as the Healthy Eating Plate. Just imagine a plate divided into logical, healthy portions.   The emphasis is on diet quality:   Load up on vegetables and fruits - one-half of your plate: Aim for color and variety, and remember that potatoes dont count.   Go for whole grains - one-quarter of your plate: Whole wheat, barley, wheat berries, quinoa, oats, brown rice, and foods made with them. If you want pasta, go with whole wheat pasta.   Protein power - one-quarter of your plate: Fish, chicken, beans, and nuts are all healthy, versatile protein sources. Limit red meat.   The diet, however, does go beyond the plate, offering a few other suggestions.   Use healthy plant oils, such as olive, canola, soy, corn, sunflower and peanut. Check the labels, and avoid partially hydrogenated oil, which have unhealthy trans fats.   If youre thirsty, drink water. Coffee and tea are good in moderation, but skip sugary drinks and limit milk and dairy products to one or two daily  servings.   The type of  carbohydrate in the diet is more important than the amount. Some sources of carbohydrates, such as vegetables, fruits, whole grains, and beans-are healthier than others.   Finally, stay active  Signed, Berniece Salines, DO  04/17/2019 12:04 PM    [place]

## 2019-04-17 NOTE — Patient Instructions (Signed)
Medication Instructions:  Your physician has recommended you make the following change in your medication:   INCREASE: Toprol XL(metoprolol succinate) 25 mg Take 1 and 1/2 tabs daily  *If you need a refill on your cardiac medications before your next appointment, please call your pharmacy*   Lab Work: nOne If you have labs (blood work) drawn today and your tests are completely normal, you will receive your results only by: Marland Kitchen MyChart Message (if you have MyChart) OR . A paper copy in the mail If you have any lab test that is abnormal or we need to change your treatment, we will call you to review the results.   Testing/Procedures: None   Follow-Up: At [facility], you and your health needs are our priority.  As part of our continuing mission to provide you with exceptional heart care, we have created designated Provider Care Teams.  These Care Teams include your primary Cardiologist (physician) and Advanced Practice Providers (APPs -  Physician Assistants and Nurse Practitioners) who all work together to provide you with the care you need, when you need it.  We recommend signing up for the patient portal called "MyChart".  Sign up information is provided on this After Visit Summary.  MyChart is used to connect with patients for Virtual Visits (Telemedicine).  Patients are able to view lab/test results, encounter notes, upcoming appointments, etc.  Non-urgent messages can be sent to your provider as well.   To learn more about what you can do with MyChart, go to [email or web address].    Your next appointment:   3 month(s)  The format for your next appointment:   In Person  Provider:   Berniece Salines, DO   Other Instructions

## 2019-04-18 ENCOUNTER — Encounter: Payer: Self-pay | Admitting: *Deleted

## 2019-04-18 ENCOUNTER — Inpatient Hospital Stay: Payer: Medicare HMO | Attending: Gynecologic Oncology | Admitting: Hematology and Oncology

## 2019-04-18 ENCOUNTER — Telehealth: Payer: Self-pay

## 2019-04-18 ENCOUNTER — Encounter: Payer: Self-pay | Admitting: Hematology and Oncology

## 2019-04-18 ENCOUNTER — Inpatient Hospital Stay: Payer: Medicare HMO

## 2019-04-18 ENCOUNTER — Encounter: Payer: Self-pay | Admitting: Oncology

## 2019-04-18 ENCOUNTER — Encounter: Payer: Self-pay | Admitting: Medical Oncology

## 2019-04-18 ENCOUNTER — Telehealth: Payer: Self-pay | Admitting: Hematology and Oncology

## 2019-04-18 ENCOUNTER — Other Ambulatory Visit: Payer: Self-pay | Admitting: Medical Oncology

## 2019-04-18 ENCOUNTER — Other Ambulatory Visit: Payer: Self-pay | Admitting: Hematology and Oncology

## 2019-04-18 VITALS — BP 121/61 | HR 84 | Temp 99.1°F | Resp 18 | Ht 62.0 in | Wt 129.4 lb

## 2019-04-18 DIAGNOSIS — R971 Elevated cancer antigen 125 [CA 125]: Secondary | ICD-10-CM

## 2019-04-18 DIAGNOSIS — Z7901 Long term (current) use of anticoagulants: Secondary | ICD-10-CM | POA: Insufficient documentation

## 2019-04-18 DIAGNOSIS — R109 Unspecified abdominal pain: Secondary | ICD-10-CM

## 2019-04-18 DIAGNOSIS — Z5111 Encounter for antineoplastic chemotherapy: Secondary | ICD-10-CM | POA: Insufficient documentation

## 2019-04-18 DIAGNOSIS — R64 Cachexia: Secondary | ICD-10-CM | POA: Diagnosis not present

## 2019-04-18 DIAGNOSIS — G893 Neoplasm related pain (acute) (chronic): Secondary | ICD-10-CM | POA: Insufficient documentation

## 2019-04-18 DIAGNOSIS — Z7189 Other specified counseling: Secondary | ICD-10-CM

## 2019-04-18 DIAGNOSIS — C562 Malignant neoplasm of left ovary: Secondary | ICD-10-CM

## 2019-04-18 DIAGNOSIS — K5900 Constipation, unspecified: Secondary | ICD-10-CM | POA: Insufficient documentation

## 2019-04-18 DIAGNOSIS — Z7952 Long term (current) use of systemic steroids: Secondary | ICD-10-CM | POA: Insufficient documentation

## 2019-04-18 DIAGNOSIS — I480 Paroxysmal atrial fibrillation: Secondary | ICD-10-CM | POA: Diagnosis not present

## 2019-04-18 DIAGNOSIS — E785 Hyperlipidemia, unspecified: Secondary | ICD-10-CM | POA: Insufficient documentation

## 2019-04-18 DIAGNOSIS — E860 Dehydration: Secondary | ICD-10-CM | POA: Insufficient documentation

## 2019-04-18 DIAGNOSIS — R5383 Other fatigue: Secondary | ICD-10-CM | POA: Diagnosis not present

## 2019-04-18 DIAGNOSIS — C786 Secondary malignant neoplasm of retroperitoneum and peritoneum: Secondary | ICD-10-CM | POA: Insufficient documentation

## 2019-04-18 DIAGNOSIS — Z79899 Other long term (current) drug therapy: Secondary | ICD-10-CM | POA: Diagnosis not present

## 2019-04-18 DIAGNOSIS — C800 Disseminated malignant neoplasm, unspecified: Secondary | ICD-10-CM

## 2019-04-18 DIAGNOSIS — D61818 Other pancytopenia: Secondary | ICD-10-CM | POA: Insufficient documentation

## 2019-04-18 DIAGNOSIS — M8580 Other specified disorders of bone density and structure, unspecified site: Secondary | ICD-10-CM | POA: Insufficient documentation

## 2019-04-18 DIAGNOSIS — R0602 Shortness of breath: Secondary | ICD-10-CM | POA: Insufficient documentation

## 2019-04-18 LAB — CBC WITH DIFFERENTIAL (CANCER CENTER ONLY)
Abs Immature Granulocytes: 0.02 10*3/uL (ref 0.00–0.07)
Basophils Absolute: 0.1 10*3/uL (ref 0.0–0.1)
Basophils Relative: 1 %
Eosinophils Absolute: 0.2 10*3/uL (ref 0.0–0.5)
Eosinophils Relative: 2 %
HCT: 35.3 % — ABNORMAL LOW (ref 36.0–46.0)
Hemoglobin: 11.4 g/dL — ABNORMAL LOW (ref 12.0–15.0)
Immature Granulocytes: 0 %
Lymphocytes Relative: 16 %
Lymphs Abs: 1.2 10*3/uL (ref 0.7–4.0)
MCH: 28.7 pg (ref 26.0–34.0)
MCHC: 32.3 g/dL (ref 30.0–36.0)
MCV: 88.9 fL (ref 80.0–100.0)
Monocytes Absolute: 0.6 10*3/uL (ref 0.1–1.0)
Monocytes Relative: 8 %
Neutro Abs: 5.6 10*3/uL (ref 1.7–7.7)
Neutrophils Relative %: 73 %
Platelet Count: 335 10*3/uL (ref 150–400)
RBC: 3.97 MIL/uL (ref 3.87–5.11)
RDW: 13.9 % (ref 11.5–15.5)
WBC Count: 7.6 10*3/uL (ref 4.0–10.5)
nRBC: 0 % (ref 0.0–0.2)

## 2019-04-18 LAB — CMP (CANCER CENTER ONLY)
ALT: 50 U/L — ABNORMAL HIGH (ref 0–44)
AST: 112 U/L — ABNORMAL HIGH (ref 15–41)
Albumin: 2.8 g/dL — ABNORMAL LOW (ref 3.5–5.0)
Alkaline Phosphatase: 59 U/L (ref 38–126)
Anion gap: 8 (ref 5–15)
BUN: 18 mg/dL (ref 8–23)
CO2: 23 mmol/L (ref 22–32)
Calcium: 8.7 mg/dL — ABNORMAL LOW (ref 8.9–10.3)
Chloride: 106 mmol/L (ref 98–111)
Creatinine: 0.78 mg/dL (ref 0.44–1.00)
GFR, Est AFR Am: >60 mL/min (ref >60)
GFR, Estimated: >60 mL/min (ref >60)
Glucose, Bld: 105 mg/dL — ABNORMAL HIGH (ref 70–99)
Potassium: 3.9 mmol/L (ref 3.5–5.1)
Sodium: 137 mmol/L (ref 135–145)
Total Bilirubin: 0.3 mg/dL (ref 0.3–1.2)
Total Protein: 7.8 g/dL (ref 6.5–8.1)

## 2019-04-18 LAB — RESEARCH LABS

## 2019-04-18 MED ORDER — ONDANSETRON HCL 8 MG PO TABS: 8.0000 mg | ORAL_TABLET | Freq: Three times a day (TID) | ORAL | 1 refills | Status: DC | PRN

## 2019-04-18 MED ORDER — DEXAMETHASONE 4 MG PO TABS: ORAL_TABLET | ORAL | 6 refills | Status: DC

## 2019-04-18 MED ORDER — OXYCODONE HCL 5 MG PO TABS: 5.0000 mg | ORAL_TABLET | ORAL | 0 refills | Status: DC | PRN

## 2019-04-18 MED ORDER — PROCHLORPERAZINE MALEATE 10 MG PO TABS: 10.0000 mg | ORAL_TABLET | Freq: Four times a day (QID) | ORAL | 1 refills | Status: DC | PRN

## 2019-04-18 NOTE — Telephone Encounter (Signed)
Called and left a message asking her to call the office back. Dr. Alvy Bimler sent a prescription for oxycodone to [facility].

## 2019-04-18 NOTE — Progress Notes (Signed)
[place] NOTE  Patient Care Team: Ernestene Kiel, MD as PCP - General (Internal Medicine) Berniece Salines, DO as PCP - Cardiology (Cardiology) Awanda Mink Craige Cotta, RN as Oncology Nurse Navigator (Oncology)  ASSESSMENT & PLAN:  Ovarian cancer, left [facility]) I reviewed the neoadjuvant chemotherapy approach with the patient and her daughter-in-law Overall, her presentation and imaging study is consistent with left ovarian cancer causing abdominal carcinomatosis  We discussed the rationale of neoadjuvant chemotherapy for 3 cycles followed by repeat imaging study If her imaging studies show favorable response, we might interrupt treatment to allow interval debulking surgery, followed by 3 more cycles of adjuvant treatment after that We reviewed the NCCN guidelines We discussed the role of chemotherapy. The intent is of curative intent.  We discussed some of the risks, benefits, side-effects of carboplatin & Taxol. Treatment is intravenous, every 3 weeks x 6 cycles  Some of the short term side-effects included, though not limited to, including weight loss, life threatening infections, risk of allergic reactions, need for transfusions of blood products, nausea, vomiting, change in bowel habits, loss of hair, admission to hospital for various reasons, and risks of death.   Long term side-effects are also discussed including risks of infertility, permanent damage to nerve function, hearing loss, chronic fatigue, kidney damage with possibility needing hemodialysis, and rare secondary malignancy including bone marrow disorders.  The patient is aware that the response rates discussed earlier is not guaranteed.  After a long discussion, patient made an informed decision to proceed with the prescribed plan of care.   Patient education material was dispensed. We discussed premedication with dexamethasone before chemotherapy. I do not plan upfront G-CSF support She has good venous  access and I do not believe she needs port placement for treatment The patient qualify for clinical trial/research and she appears interested I will get her referred to see research nurse She will attend chemo education class and get lab drawn today We will follow tumor marker once a month I will try to get her started on chemotherapy as soon as possible  Atrial fibrillation ([place]) She had history of poorly controlled intermittent atrial fibrillation with rapid ventricular rate I am concerned about interrupting anticoagulation therapy for port placement She has reasonable venous access and I plan to use her peripheral veins for treatment for now   Abdominal pain This is related to partial constipation and abdominal carcinomatosis We discussed the importance of avoiding being constipated with aggressive laxative therapy I also recommend small doses of pain medicine to use as needed and we discussed potential risk of side effects of narcotic prescription   Carcinomatosis ([place]) She has signs and symptoms of abdominal carcinomatosis with bloating, nausea, abdominal pain and changes in bowel habits We discussed the importance of pain control, avoiding constipation, taking antiemetics as needed and to take frequent small meals  Goals of care, counseling/discussion She had great performance status prior to diagnosis a few months ago We discussed neoadjuvant chemotherapy approach with curative intent   Orders Placed This Encounter  Procedures  . CBC with Differential (Cancer Center Only)    Standing Status:   Standing    Number of Occurrences:   20    Standing Expiration Date:   04/17/2020  . CMP ([place] only)    Standing Status:   Standing    Number of Occurrences:   20    Standing Expiration Date:   04/17/2020    The total time spent in the appointment was 42  minutes encounter with patients including review of chart and various tests results, discussions about plan of care and  coordination of care plan   All questions were answered. The patient knows to call the clinic with any problems, questions or concerns. No barriers to learning was detected.  Heath Lark, MD 3/11/20213:42 PM  CHIEF COMPLAINTS/PURPOSE OF CONSULTATION:  Newly diagnosed ovarian cancer with carcinomatosis, for neoadjuvant chemotherapy  HISTORY OF PRESENTING ILLNESS:  Whitney Floyd 85 y.o. female is here because of recent diagnosis of cancer Flo is accompanied by her daughter-in-law, Butch Penny today She is retired, lives with one of her sons, daily The patient has 6 children, 4 sons and 2 girls She has been active since retirement with line dancing, playing golf and doing all activities of daily living  She has not been feeling well over the last 3 months since the end of the year last year She has been having some nonspecific abdominal pain that comes and goes especially in the right upper quadrant region She had multiple evaluation including right upper quadrant ultrasound which came back unremarkable She also underwent CT scan of the chest at the end of December which show possible gallbladder polyp but no other abnormal findings She has intermittent atypical chest pain along with atrial fibrillation with rapid ventricular rate and had multiple cardiac evaluation including echocardiogram and stress test She has gradual sensation of nausea and bloating She has difficulties eating over the last 3 months She has changes in the bowel habits with significant abdominal discomfort in the lower abdomen especially before and after bowel habits with small frequent bowel movement which she labeled as diarrhea She have reduced performance status and inability to tolerate all her previous activities of daily living She has shortness of breath on minimal exertion and excessive fatigue Her abdominal pain now is more diffuse around the periumbilical region towards the left side At worst, her pain could be  as severe as 7 out of 10 pain Currently, she rated her pain at about 5 out of 10 pain She has not taken any over-the-counter analgesics  I have reviewed her chart and materials related to her cancer extensively and collaborated history with the patient. Summary of oncologic history is as follows: Oncology History  Carcinomatosis ([place])  04/03/2019 Initial Diagnosis   Carcinomatosis ([place])   Ovarian cancer, left ([place])  02/07/2019 Imaging   Outside CT chest showed no evidence of metastatic disease   03/20/2019 Imaging   Outside Ct abdomen and pelvis showed diffuse carcinomatosis, left adnexa mass and ascites   04/11/2019 Pathology Results   FINAL MICROSCOPIC DIAGNOSIS:   A. OMENTUM, LEFT ABDOMINAL, NEEDLE CORE BIOPSY:  - Metastatic carcinoma.  See comment    COMMENT:   Immunohistochemical stains show that the tumor cells are positive for PAX8, ER, WT1, CK7 (focal) and CK 5/6 (patchy); and negative for p63, calretinin, D2-40, CK20, GATA3 and CDX2.  This mmunohistochemical profile is consistent with a gynecologic primary and suggestive of a low-grade serous carcinoma.   04/11/2019 Procedure   Image guided core biopsy of the omental disease. In addition, small amount of ascites was collected for cytology   04/18/2019 Cancer Staging   Staging form: Ovary, Fallopian Tube, and Primary Peritoneal Carcinoma, AJCC 8th Edition - Clinical stage from 04/18/2019: FIGO Stage IIIC (cT3c, cN0, cM0) - Signed by Heath Lark, MD on 04/18/2019     MEDICAL HISTORY:  Past Medical History:  Diagnosis Date  . Anemia   . Aortic atherosclerosis ([place])   .  Arthritis   . Atrial fibrillation ([place])   . 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 Carcinomatosis ([place])   . Cataract   . Diverticulosis   . GERD (gastroesophageal reflux disease)   . Hyperlipidemia   . Osteopenia   . Paroxysmal A-fib ([place]) 02/08/2019  . Peptic ulcer disease     SURGICAL HISTORY: Past Surgical History:  Procedure Laterality  Date  . benign bone tumor removal    . CATARACT EXTRACTION, BILATERAL    . COLONOSCOPY    . TUBAL LIGATION      SOCIAL HISTORY: Social History   Socioeconomic History  . Marital status: Single    Spouse name: Not on file  . Number of children: 6  . Years of education: Not on file  . Highest education level: Not on file  Occupational History  . Occupation: retired Sales executive  Tobacco Use  . Smoking status: Never Smoker  . Smokeless tobacco: Never Used  Substance and Sexual Activity  . Alcohol use: Never  . Drug use: Never  . Sexual activity: Not Currently  Other Topics Concern  . Not on file  Social History Narrative   Lived with son, Quita Skye   Social Determinants of Health   Financial Resource Strain:   . Difficulty of Paying Living Expenses:   Food Insecurity:   . Worried About Charity fundraiser in the Last Year:   . Arboriculturist in the Last Year:   Transportation Needs:   . Film/video editor (Medical):   Marland Kitchen Lack of Transportation (Non-Medical):   Physical Activity:   . Days of Exercise per Week:   . Minutes of Exercise per Session:   Stress:   . Feeling of Stress :   Social Connections:   . Frequency of Communication with Friends and Family:   . Frequency of Social Gatherings with Friends and Family:   . Attends Religious Services:   . Active Member of Clubs or Organizations:   . Attends Archivist Meetings:   Marland Kitchen Marital Status:   Intimate Partner Violence:   . Fear of Current or Ex-Partner:   . Emotionally Abused:   Marland Kitchen Physically Abused:   . Sexually Abused:     FAMILY HISTORY: Family History  Problem Relation Age of Onset  . Diabetes Mother   . Dementia Mother   . Other Mother        Had large benign ovarian mass removed surgically  . Arrhythmia Father   . Stroke Father   . Ovarian cancer Paternal Grandmother        Thinks that she died relatively young  . Breast cancer Paternal Aunt        Died in her 17s or 53s  . Colon  cancer Neg Hx   . Uterine cancer Neg Hx   . Prostate cancer Neg Hx     ALLERGIES:  is allergic to codeine sulfate [codeine]; tetanus toxoids; horse-derived products; and sulfa antibiotics.  MEDICATIONS:  Current Outpatient Medications  Medication Sig Dispense Refill  . dexamethasone (DECADRON) 4 MG tablet Take 2 tabs at the night before and 2 tab the morning of chemotherapy, every 3 weeks, by mouth 24 tablet 6  . diltiazem (CARDIZEM) 120 MG tablet Take 120 mg by mouth daily.    Marland Kitchen ELIQUIS 5 MG TABS tablet Take 5 mg by mouth 2 (two) times daily.    . metoprolol succinate (TOPROL XL) 25 MG 24 hr tablet Take 1.5  tablets (37.5 mg total) by mouth daily. 135 tablet 1  . omeprazole (PRILOSEC) 20 MG capsule Take 20 mg by mouth 2 (two) times daily before a meal.     . ondansetron (ZOFRAN) 8 MG tablet Take 1 tablet (8 mg total) by mouth every 8 (eight) hours as needed for refractory nausea / vomiting. 30 tablet 1  . oxyCODONE (OXY IR/ROXICODONE) 5 MG immediate release tablet Take 1 tablet (5 mg total) by mouth every 4 (four) hours as needed for severe pain. 30 tablet 0  . prochlorperazine (COMPAZINE) 10 MG tablet Take 1 tablet (10 mg total) by mouth every 6 (six) hours as needed (Nausea or vomiting). 30 tablet 1   No current facility-administered medications for this visit.    REVIEW OF SYSTEMS:   Constitutional: Denies fevers, chills or abnormal night sweats Eyes: Denies blurriness of vision, double vision or watery eyes Ears, nose, mouth, throat, and face: Denies mucositis or sore throat Cardiovascular: Denies palpitation, chest discomfort or lower extremity swelling Skin: Denies abnormal skin rashes Lymphatics: Denies new lymphadenopathy or easy bruising Behavioral/Psych: Mood is stable, no new changes  All other systems were reviewed with the patient and are negative.  PHYSICAL EXAMINATION: ECOG PERFORMANCE STATUS: 2 - Symptomatic, <50% confined to bed  Vitals:   04/18/19 1252  BP:  121/61  Pulse: 84  Resp: 18  Temp: 99.1 F (37.3 C)  SpO2: 98%   Filed Weights   04/18/19 1252  Weight: 129 lb 6.4 oz (58.7 kg)    GENERAL:alert, no distress and comfortable SKIN: skin color, texture, turgor are normal, no rashes or significant lesions EYES: normal, conjunctiva are pink and non-injected, sclera clear OROPHARYNX:no exudate, no erythema and lips, buccal mucosa, and tongue normal  NECK: supple, thyroid normal size, non-tender, without nodularity LYMPH:  no palpable lymphadenopathy in the cervical, axillary or inguinal LUNGS: clear to auscultation and percussion with normal breathing effort HEART: regular rate & rhythm and no murmurs and no lower extremity edema ABDOMEN:abdomen soft, appears distended, palpable carcinomatosis on the left anterior abdominal region more so in the periumbilical area to his left upper quadrant region, firm on palpation.  Palpable abdominal ascites noted Musculoskeletal:no cyanosis of digits and no clubbing  PSYCH: alert & oriented x 3 with fluent speech NEURO: no focal motor/sensory deficits  LABORATORY DATA:  I have reviewed the data as listed Lab Results  Component Value Date   WBC 7.6 04/18/2019   HGB 11.4 (L) 04/18/2019   HCT 35.3 (L) 04/18/2019   MCV 88.9 04/18/2019   PLT 335 04/18/2019   Recent Labs    03/04/19 1107  NA 137  K 4.4  CL 102  CO2 22  GLUCOSE 106*  BUN 11  CREATININE 0.92  CALCIUM 8.5*  GFRNONAA 58*  GFRAA 67  PROT 7.4  ALBUMIN 3.6  AST 60*  ALT 25  ALKPHOS 69  BILITOT 0.2  BILIDIR 0.10    RADIOGRAPHIC STUDIES: I have also reviewed multiple CT imaging studies with the patient and family I have personally reviewed the radiological images as listed and agreed with the findings in the report. CT BIOPSY  Result Date: 04/11/2019 INDICATION: 85 year old with evidence of diffuse omental and peritoneal disease. Tissue diagnosis is needed. EXAM: IMAGE GUIDED OMENTAL BIOPSY AND FLUID ASPIRATION  MEDICATIONS: None. ANESTHESIA/SEDATION: Moderate (conscious) sedation was employed during this procedure. A total of Versed 2.0 mg and Fentanyl 100 mcg was administered intravenously. Moderate Sedation Time: 21 minutes. The patient's level of consciousness and vital signs  were monitored continuously by radiology nursing throughout the procedure under my direct supervision. FLUOROSCOPY TIME:  Fluoroscopy Time: None COMPLICATIONS: None immediate. PROCEDURE: Informed written consent was obtained from the patient after a thorough discussion of the procedural risks, benefits and alternatives. All questions were addressed. A timeout was performed prior to the initiation of the procedure. Patient was placed supine. CT images through the abdomen were obtained. Omental disease in the left anterior abdomen was identified and targeted. This area was also evaluated with ultrasound. Skin was prepped with chlorhexidine and sterile field was created. Skin was anesthetized with 1% lidocaine. Small incision was made. 17 gauge coaxial needle was directed into the omental thickening using ultrasound guidance. Needle position was confirmed with CT. Total of 4 core biopsies were obtained under ultrasound guidance. Specimens placed in saline. In addition, approximately 30 mL of red serous ascites was aspirated from the 17 gauge needle prior to removal. Bandage placed over the puncture site. FINDINGS: Extensive omental thickening in the left anterior abdomen. Needle position confirmed within the omental disease with CT and ultrasound. Four adequate core biopsies were obtained. Small amount of ascites was also collected for cytology. IMPRESSION: Image guided core biopsy of the omental disease. In addition, small amount of ascites was collected for cytology. Electronically Signed   By: Markus Daft M.D.   On: 04/11/2019 10:30

## 2019-04-18 NOTE — Progress Notes (Signed)
[employer]: Blood Sample Collection to Evaluate Biomarkers in Subjects with Untreated Solid Tumors Dr. Alvy Bimler referred patient to study. I met with patient, who is here with her daughter and daughter-in-law today, after her appointment with Dr. Alvy Bimler and her chemo-education class. Patient confirms that MD gave her a description of the study and patient expressed interest in participating. Patient and I reviewed the study consent form Version 3.0 page by page. Patient confirms understanding to the purpose of the study, that study participation is voluntary, length of study participation, information collection, what will be done with her samples and how her information will be protected, as well as any benefits or risks to participation and compensation of participation. After all of patient's and family's questions were answered to their satisfaction, patient proceeded to sigh, date and time where indicated. Patient was then taken to lab for study blood draw, where her blood was drawn via a venipuncture, as well as, other labs ordered by MD. Kit collection # [id number] At the completion of the study blood draw, patient was given the study provided $50 gift card  Approximately 20 minutes was spent consenting patient to study. Second nurse verification of eligibility completed by Foye Spurling, with Dr. Alvy Bimler confirming eligibility as well.  Maxwell Marion, RN, BSN, [facility] 04/18/2019 3:53 PM  04/22/2019 10:00 AM Patient here in clinic this morning for the start of her scheduled chemo-therapy treatment. I met with patient in treatment room and collected the required data for study, regarding smoking/tobacco, alcohol and family cancer history. Patient was thanked for her time and contribution to the study and was encouraged to call with questions.  Maxwell Marion, RN, BSN, [facility] 04/22/2019 10:55 AM

## 2019-04-18 NOTE — Progress Notes (Signed)
Met with Whitney Floyd and her daughter in law after the appointment with Dr. Alvy Bimler.  She was given the [employer] and was encouraged to call with any questions.

## 2019-04-18 NOTE — Assessment & Plan Note (Signed)
This is related to partial constipation and abdominal carcinomatosis We discussed the importance of avoiding being constipated with aggressive laxative therapy I also recommend small doses of pain medicine to use as needed and we discussed potential risk of side effects of narcotic prescription

## 2019-04-18 NOTE — Telephone Encounter (Signed)
Scheduled appt per 3/11 sch message - unable to reach pt .let  RN know

## 2019-04-18 NOTE — Assessment & Plan Note (Signed)
She had history of poorly controlled intermittent atrial fibrillation with rapid ventricular rate I am concerned about interrupting anticoagulation therapy for port placement She has reasonable venous access and I plan to use her peripheral veins for treatment for now

## 2019-04-18 NOTE — Assessment & Plan Note (Signed)
She has signs and symptoms of abdominal carcinomatosis with bloating, nausea, abdominal pain and changes in bowel habits We discussed the importance of pain control, avoiding constipation, taking antiemetics as needed and to take frequent small meals

## 2019-04-18 NOTE — Progress Notes (Signed)
START ON PATHWAY REGIMEN - Ovarian     A cycle is every 21 days:     Paclitaxel      Carboplatin   **Always confirm dose/schedule in your pharmacy ordering system**  Patient Characteristics: Preoperative or Nonsurgical Candidate (Clinical Staging), Newly Diagnosed, Neoadjuvant Therapy followed by Surgery Therapeutic Status: Preoperative or Nonsurgical Candidate (Clinical Staging) BRCA Mutation Status: Awaiting Test Results AJCC T Category: cT3c AJCC 8 Stage Grouping: IIIC AJCC N Category: cNX AJCC M Category: cM0 Therapy Plan: Neoadjuvant Therapy followed by Surgery Intent of Therapy: Curative Intent, Discussed with Patient

## 2019-04-18 NOTE — Assessment & Plan Note (Signed)
She had great performance status prior to diagnosis a few months ago We discussed neoadjuvant chemotherapy approach with curative intent

## 2019-04-18 NOTE — Assessment & Plan Note (Addendum)
I reviewed the neoadjuvant chemotherapy approach with the patient and her daughter-in-law Overall, her presentation and imaging study is consistent with left ovarian cancer causing abdominal carcinomatosis  We discussed the rationale of neoadjuvant chemotherapy for 3 cycles followed by repeat imaging study If her imaging studies show favorable response, we might interrupt treatment to allow interval debulking surgery, followed by 3 more cycles of adjuvant treatment after that We reviewed the NCCN guidelines We discussed the role of chemotherapy. The intent is of curative intent.  We discussed some of the risks, benefits, side-effects of carboplatin & Taxol. Treatment is intravenous, every 3 weeks x 6 cycles  Some of the short term side-effects included, though not limited to, including weight loss, life threatening infections, risk of allergic reactions, need for transfusions of blood products, nausea, vomiting, change in bowel habits, loss of hair, admission to hospital for various reasons, and risks of death.   Long term side-effects are also discussed including risks of infertility, permanent damage to nerve function, hearing loss, chronic fatigue, kidney damage with possibility needing hemodialysis, and rare secondary malignancy including bone marrow disorders.  The patient is aware that the response rates discussed earlier is not guaranteed.  After a long discussion, patient made an informed decision to proceed with the prescribed plan of care.   Patient education material was dispensed. We discussed premedication with dexamethasone before chemotherapy. I do not plan upfront G-CSF support She has good venous access and I do not believe she needs port placement for treatment The patient qualify for clinical trial/research and she appears interested I will get her referred to see research nurse She will attend chemo education class and get lab drawn today We will follow tumor marker once a  month I will try to get her started on chemotherapy as soon as possible

## 2019-04-19 ENCOUNTER — Other Ambulatory Visit: Payer: Self-pay | Admitting: Hematology and Oncology

## 2019-04-19 LAB — CA 125: Cancer Antigen (CA) 125: 402 U/mL — ABNORMAL HIGH (ref 0.0–38.1)

## 2019-04-19 NOTE — Progress Notes (Signed)
Pharmacist Chemotherapy Monitoring - Initial Assessment    Anticipated start date: 04/22/2019   Regimen:  . Are orders appropriate based on the patient's diagnosis, regimen, and cycle? Yes . Does the plan date match the patient's scheduled date? Yes . Is the sequencing of drugs appropriate? Yes . Are the premedications appropriate for the patient's regimen? Yes . Prior Authorization for treatment is: Approved o If applicable, is the correct biosimilar selected based on the patient's insurance? N/A  Organ Function and Labs: Marland Kitchen Are dose adjustments needed based on the patient's renal function, hepatic function, or hematologic function? MD reduced paclitaxel up front for C1 . Are appropriate labs ordered prior to the start of patient's treatment? Yes  Dose Assessment: . Are the drug doses appropriate? Yes . Are the following correct: o Drug concentrations Yes o IV fluid compatible with drug Yes o Administration routes Yes o Timing of therapy Yes . If applicable, does the patient have documented access for treatment and/or plans for port-a-cath placement? not applicable . If applicable, have lifetime cumulative doses been properly documented and assessed? yes Lifetime Dose Tracking  No doses have been documented on this patient for the following tracked chemicals: Doxorubicin, Epirubicin, Idarubicin, Daunorubicin, Mitoxantrone, Bleomycin, Oxaliplatin, Carboplatin, Liposomal Doxorubicin  o   Toxicity Monitoring/Prevention: . The patient has the following take home antiemetics prescribed: Ondansetron and Prochlorperazine and Dexamethasone . Medication allergies and previous infusion related reactions, if applicable, have been reviewed and addressed. Yes . The patient's current medication list has been assessed for drug-drug interactions with their chemotherapy regimen. no significant drug-drug interactions were identified on review.  Order Review: . Are the treatment plan orders signed?  Yes . Is the patient scheduled to see a provider prior to their treatment? No  I verify that I have reviewed each item in the above checklist and answered each question accordingly.  [facility] 04/19/2019 10:33 AM

## 2019-04-19 NOTE — Telephone Encounter (Signed)
She called back. Given below message. She has already picked up perscription. Reminded of next appt. She verbalized understanding.

## 2019-04-22 ENCOUNTER — Inpatient Hospital Stay: Payer: Medicare HMO

## 2019-04-22 ENCOUNTER — Other Ambulatory Visit: Payer: Self-pay

## 2019-04-22 ENCOUNTER — Other Ambulatory Visit: Payer: Self-pay | Admitting: Medical

## 2019-04-22 ENCOUNTER — Inpatient Hospital Stay (HOSPITAL_BASED_OUTPATIENT_CLINIC_OR_DEPARTMENT_OTHER): Payer: Medicare HMO | Admitting: Medical

## 2019-04-22 ENCOUNTER — Encounter: Payer: Self-pay | Admitting: Oncology

## 2019-04-22 ENCOUNTER — Encounter: Payer: Self-pay | Admitting: Medical Oncology

## 2019-04-22 VITALS — BP 120/63 | HR 73 | Temp 97.7°F | Resp 16

## 2019-04-22 DIAGNOSIS — R971 Elevated cancer antigen 125 [CA 125]: Secondary | ICD-10-CM

## 2019-04-22 DIAGNOSIS — C562 Malignant neoplasm of left ovary: Secondary | ICD-10-CM | POA: Diagnosis not present

## 2019-04-22 DIAGNOSIS — Z5111 Encounter for antineoplastic chemotherapy: Secondary | ICD-10-CM | POA: Diagnosis not present

## 2019-04-22 DIAGNOSIS — R0602 Shortness of breath: Secondary | ICD-10-CM | POA: Diagnosis not present

## 2019-04-22 DIAGNOSIS — C800 Disseminated malignant neoplasm, unspecified: Secondary | ICD-10-CM

## 2019-04-22 DIAGNOSIS — D61818 Other pancytopenia: Secondary | ICD-10-CM | POA: Diagnosis not present

## 2019-04-22 DIAGNOSIS — R64 Cachexia: Secondary | ICD-10-CM | POA: Diagnosis not present

## 2019-04-22 DIAGNOSIS — C786 Secondary malignant neoplasm of retroperitoneum and peritoneum: Secondary | ICD-10-CM | POA: Diagnosis not present

## 2019-04-22 DIAGNOSIS — G893 Neoplasm related pain (acute) (chronic): Secondary | ICD-10-CM | POA: Diagnosis not present

## 2019-04-22 DIAGNOSIS — I480 Paroxysmal atrial fibrillation: Secondary | ICD-10-CM | POA: Diagnosis not present

## 2019-04-22 DIAGNOSIS — K5900 Constipation, unspecified: Secondary | ICD-10-CM | POA: Diagnosis not present

## 2019-04-22 DIAGNOSIS — L309 Dermatitis, unspecified: Secondary | ICD-10-CM

## 2019-04-22 DIAGNOSIS — L509 Urticaria, unspecified: Secondary | ICD-10-CM

## 2019-04-22 DIAGNOSIS — Z7189 Other specified counseling: Secondary | ICD-10-CM

## 2019-04-22 DIAGNOSIS — E860 Dehydration: Secondary | ICD-10-CM | POA: Diagnosis not present

## 2019-04-22 MED ORDER — FOSAPREPITANT DIMEGLUMINE INJECTION 150 MG
150.0000 mg | Freq: Once | INTRAVENOUS | Status: AC
Start: 2019-04-22 — End: 2019-04-22
  Administered 2019-04-22: 150 mg via INTRAVENOUS
  Filled 2019-04-22: qty 150

## 2019-04-22 MED ORDER — DEXAMETHASONE SODIUM PHOSPHATE 10 MG/ML IJ SOLN
INTRAMUSCULAR | Status: AC
  Filled 2019-04-22: qty 1

## 2019-04-22 MED ORDER — FAMOTIDINE IN NACL 20-0.9 MG/50ML-% IV SOLN
INTRAVENOUS | Status: AC
  Filled 2019-04-22: qty 50

## 2019-04-22 MED ORDER — PALONOSETRON HCL INJECTION 0.25 MG/5ML
INTRAVENOUS | Status: AC
  Filled 2019-04-22: qty 5

## 2019-04-22 MED ORDER — PALONOSETRON HCL INJECTION 0.25 MG/5ML
0.2500 mg | Freq: Once | INTRAVENOUS | Status: AC
  Administered 2019-04-22: 0.25 mg via INTRAVENOUS

## 2019-04-22 MED ORDER — DIPHENHYDRAMINE HCL 50 MG/ML IJ SOLN
INTRAMUSCULAR | Status: AC
  Filled 2019-04-22: qty 1

## 2019-04-22 MED ORDER — DIPHENHYDRAMINE HCL 50 MG/ML IJ SOLN
25.0000 mg | Freq: Once | INTRAMUSCULAR | Status: AC
  Administered 2019-04-22: 25 mg via INTRAVENOUS

## 2019-04-22 MED ORDER — SODIUM CHLORIDE 0.9 % IV SOLN
10.0000 mg | Freq: Once | INTRAVENOUS | Status: AC
  Administered 2019-04-22: 10 mg via INTRAVENOUS
  Filled 2019-04-22: qty 1

## 2019-04-22 MED ORDER — TRIAMCINOLONE ACETONIDE 0.5 % EX CREA: 1.0000 "application " | TOPICAL_CREAM | Freq: Three times a day (TID) | CUTANEOUS | 1 refills | Status: DC

## 2019-04-22 MED ORDER — SODIUM CHLORIDE 0.9 % IV SOLN
131.2500 mg/m2 | Freq: Once | INTRAVENOUS | Status: AC
  Administered 2019-04-22: 216 mg via INTRAVENOUS
  Filled 2019-04-22: qty 36

## 2019-04-22 MED ORDER — FAMOTIDINE IN NACL 20-0.9 MG/50ML-% IV SOLN
20.0000 mg | Freq: Once | INTRAVENOUS | Status: AC
  Administered 2019-04-22: 20 mg via INTRAVENOUS

## 2019-04-22 MED ORDER — SODIUM CHLORIDE 0.9 % IV SOLN
400.8000 mg | Freq: Once | INTRAVENOUS | Status: AC
  Administered 2019-04-22: 400 mg via INTRAVENOUS
  Filled 2019-04-22: qty 40

## 2019-04-22 MED ORDER — SODIUM CHLORIDE 0.9 % IV SOLN
Freq: Once | INTRAVENOUS | Status: AC
  Filled 2019-04-22: qty 250

## 2019-04-22 NOTE — Patient Instructions (Addendum)
[facility] Discharge Instructions for Patients Receiving Chemotherapy  Today you received the following chemotherapy agents: Taxol, Carboplatin  To help prevent nausea and vomiting after your treatment, we encourage you to take your nausea medication as directed.   If you develop nausea and vomiting that is not controlled by your nausea medication, call the clinic.   BELOW ARE SYMPTOMS THAT SHOULD BE REPORTED IMMEDIATELY:  *FEVER GREATER THAN 100.5 F  *CHILLS WITH OR WITHOUT FEVER  NAUSEA AND VOMITING THAT IS NOT CONTROLLED WITH YOUR NAUSEA MEDICATION  *UNUSUAL SHORTNESS OF BREATH  *UNUSUAL BRUISING OR BLEEDING  TENDERNESS IN MOUTH AND THROAT WITH OR WITHOUT PRESENCE OF ULCERS  *URINARY PROBLEMS  *BOWEL PROBLEMS  UNUSUAL RASH Items with * indicate a potential emergency and should be followed up as soon as possible.  Feel free to call the clinic should you have any questions or concerns. The clinic phone number is [phone].  Please show the CHEMO ALERT CARD at check-in to the Emergency Department and triage nurse.  Paclitaxel injection What is this medicine? PACLITAXEL (PAK li TAX el) is a chemotherapy drug. It targets fast dividing cells, like cancer cells, and causes these cells to die. This medicine is used to treat ovarian cancer, breast cancer, lung cancer, Kaposi's sarcoma, and other cancers. This medicine may be used for other purposes; ask your health care provider or pharmacist if you have questions. COMMON BRAND NAME(S): Onxol, Taxol What should I tell my health care provider before I take this medicine? They need to know if you have any of these conditions:  history of irregular heartbeat  liver disease  low blood counts, like low white cell, platelet, or red cell counts  lung or breathing disease, like asthma  tingling of the fingers or toes, or other nerve disorder  an unusual or allergic reaction to paclitaxel, alcohol,  polyoxyethylated castor oil, other chemotherapy, other medicines, foods, dyes, or preservatives  pregnant or trying to get pregnant  breast-feeding How should I use this medicine? This drug is given as an infusion into a vein. It is administered in a hospital or clinic by a specially trained health care professional. Talk to your pediatrician regarding the use of this medicine in children. Special care may be needed. Overdosage: If you think you have taken too much of this medicine contact a poison control center or emergency room at once. NOTE: This medicine is only for you. Do not share this medicine with others. What if I miss a dose? It is important not to miss your dose. Call your doctor or health care professional if you are unable to keep an appointment. What may interact with this medicine? Do not take this medicine with any of the following medications:  disulfiram  metronidazole This medicine may also interact with the following medications:  antiviral medicines for hepatitis, HIV or AIDS  certain antibiotics like erythromycin and clarithromycin  certain medicines for fungal infections like ketoconazole and itraconazole  certain medicines for seizures like carbamazepine, phenobarbital, phenytoin  gemfibrozil  nefazodone  rifampin  St. John's wort This list may not describe all possible interactions. Give your health care provider a list of all the medicines, herbs, non-prescription drugs, or dietary supplements you use. Also tell them if you smoke, drink alcohol, or use illegal drugs. Some items may interact with your medicine. What should I watch for while using this medicine? Your condition will be monitored carefully while you are receiving this medicine. You will need important blood   work done while you are taking this medicine. This medicine can cause serious allergic reactions. To reduce your risk you will need to take other medicine(s) before treatment with this  medicine. If you experience allergic reactions like skin rash, itching or hives, swelling of the face, lips, or tongue, tell your doctor or health care professional right away. In some cases, you may be given additional medicines to help with side effects. Follow all directions for their use. This drug may make you feel generally unwell. This is not uncommon, as chemotherapy can affect healthy cells as well as cancer cells. Report any side effects. Continue your course of treatment even though you feel ill unless your doctor tells you to stop. Call your doctor or health care professional for advice if you get a fever, chills or sore throat, or other symptoms of a cold or flu. Do not treat yourself. This drug decreases your body's ability to fight infections. Try to avoid being around people who are sick. This medicine may increase your risk to bruise or bleed. Call your doctor or health care professional if you notice any unusual bleeding. Be careful brushing and flossing your teeth or using a toothpick because you may get an infection or bleed more easily. If you have any dental work done, tell your dentist you are receiving this medicine. Avoid taking products that contain aspirin, acetaminophen, ibuprofen, naproxen, or ketoprofen unless instructed by your doctor. These medicines may hide a fever. Do not become pregnant while taking this medicine. Women should inform their doctor if they wish to become pregnant or think they might be pregnant. There is a potential for serious side effects to an unborn child. Talk to your health care professional or pharmacist for more information. Do not breast-feed an infant while taking this medicine. Men are advised not to father a child while receiving this medicine. This product may contain alcohol. Ask your pharmacist or healthcare provider if this medicine contains alcohol. Be sure to tell all healthcare providers you are taking this medicine. Certain medicines,  like metronidazole and disulfiram, can cause an unpleasant reaction when taken with alcohol. The reaction includes flushing, headache, nausea, vomiting, sweating, and increased thirst. The reaction can last from 30 minutes to several hours. What side effects may I notice from receiving this medicine? Side effects that you should report to your doctor or health care professional as soon as possible:  allergic reactions like skin rash, itching or hives, swelling of the face, lips, or tongue  breathing problems  changes in vision  fast, irregular heartbeat  high or low blood pressure  mouth sores  pain, tingling, numbness in the hands or feet  signs of decreased platelets or bleeding - bruising, pinpoint red spots on the skin, black, tarry stools, blood in the urine  signs of decreased red blood cells - unusually weak or tired, feeling faint or lightheaded, falls  signs of infection - fever or chills, cough, sore throat, pain or difficulty passing urine  signs and symptoms of liver injury like dark yellow or brown urine; general ill feeling or flu-like symptoms; light-colored stools; loss of appetite; nausea; right upper belly pain; unusually weak or tired; yellowing of the eyes or skin  swelling of the ankles, feet, hands  unusually slow heartbeat Side effects that usually do not require medical attention (report to your doctor or health care professional if they continue or are bothersome):  diarrhea  hair loss  loss of appetite  muscle or joint pain    nausea, vomiting  pain, redness, or irritation at site where injected  tiredness This list may not describe all possible side effects. Call your doctor for medical advice about side effects. You may report side effects to FDA at 1-800-FDA-1088. Where should I keep my medicine? This drug is given in a hospital or clinic and will not be stored at home. NOTE: This sheet is a summary. It may not cover all possible information.  If you have questions about this medicine, talk to your doctor, pharmacist, or health care provider.  2020 Elsevier/Gold Standard (2016-09-27 13:14:55)  Carboplatin injection What is this medicine? CARBOPLATIN (KAR boe pla tin) is a chemotherapy drug. It targets fast dividing cells, like cancer cells, and causes these cells to die. This medicine is used to treat ovarian cancer and many other cancers. This medicine may be used for other purposes; ask your health care provider or pharmacist if you have questions. COMMON BRAND NAME(S): Paraplatin What should I tell my health care provider before I take this medicine? They need to know if you have any of these conditions:  blood disorders  hearing problems  kidney disease  recent or ongoing radiation therapy  an unusual or allergic reaction to carboplatin, cisplatin, other chemotherapy, other medicines, foods, dyes, or preservatives  pregnant or trying to get pregnant  breast-feeding How should I use this medicine? This drug is usually given as an infusion into a vein. It is administered in a hospital or clinic by a specially trained health care professional. Talk to your pediatrician regarding the use of this medicine in children. Special care may be needed. Overdosage: If you think you have taken too much of this medicine contact a poison control center or emergency room at once. NOTE: This medicine is only for you. Do not share this medicine with others. What if I miss a dose? It is important not to miss a dose. Call your doctor or health care professional if you are unable to keep an appointment. What may interact with this medicine?  medicines for seizures  medicines to increase blood counts like filgrastim, pegfilgrastim, sargramostim  some antibiotics like amikacin, gentamicin, neomycin, streptomycin, tobramycin  vaccines Talk to your doctor or health care professional before taking any of these  medicines:  acetaminophen  aspirin  ibuprofen  ketoprofen  naproxen This list may not describe all possible interactions. Give your health care provider a list of all the medicines, herbs, non-prescription drugs, or dietary supplements you use. Also tell them if you smoke, drink alcohol, or use illegal drugs. Some items may interact with your medicine. What should I watch for while using this medicine? Your condition will be monitored carefully while you are receiving this medicine. You will need important blood work done while you are taking this medicine. This drug may make you feel generally unwell. This is not uncommon, as chemotherapy can affect healthy cells as well as cancer cells. Report any side effects. Continue your course of treatment even though you feel ill unless your doctor tells you to stop. In some cases, you may be given additional medicines to help with side effects. Follow all directions for their use. Call your doctor or health care professional for advice if you get a fever, chills or sore throat, or other symptoms of a cold or flu. Do not treat yourself. This drug decreases your body's ability to fight infections. Try to avoid being around people who are sick. This medicine may increase your risk to bruise or bleed.   Call your doctor or health care professional if you notice any unusual bleeding. Be careful brushing and flossing your teeth or using a toothpick because you may get an infection or bleed more easily. If you have any dental work done, tell your dentist you are receiving this medicine. Avoid taking products that contain aspirin, acetaminophen, ibuprofen, naproxen, or ketoprofen unless instructed by your doctor. These medicines may hide a fever. Do not become pregnant while taking this medicine. Women should inform their doctor if they wish to become pregnant or think they might be pregnant. There is a potential for serious side effects to an unborn child. Talk  to your health care professional or pharmacist for more information. Do not breast-feed an infant while taking this medicine. What side effects may I notice from receiving this medicine? Side effects that you should report to your doctor or health care professional as soon as possible:  allergic reactions like skin rash, itching or hives, swelling of the face, lips, or tongue  signs of infection - fever or chills, cough, sore throat, pain or difficulty passing urine  signs of decreased platelets or bleeding - bruising, pinpoint red spots on the skin, black, tarry stools, nosebleeds  signs of decreased red blood cells - unusually weak or tired, fainting spells, lightheadedness  breathing problems  changes in hearing  changes in vision  chest pain  high blood pressure  low blood counts - This drug may decrease the number of white blood cells, red blood cells and platelets. You may be at increased risk for infections and bleeding.  nausea and vomiting  pain, swelling, redness or irritation at the injection site  pain, tingling, numbness in the hands or feet  problems with balance, talking, walking  trouble passing urine or change in the amount of urine Side effects that usually do not require medical attention (report to your doctor or health care professional if they continue or are bothersome):  hair loss  loss of appetite  metallic taste in the mouth or changes in taste This list may not describe all possible side effects. Call your doctor for medical advice about side effects. You may report side effects to FDA at 1-800-FDA-1088. Where should I keep my medicine? This drug is given in a hospital or clinic and will not be stored at home. NOTE: This sheet is a summary. It may not cover all possible information. If you have questions about this medicine, talk to your doctor, pharmacist, or health care provider.  2020 Elsevier/Gold Standard (2007-05-01 14:38:05)  

## 2019-04-22 NOTE — Progress Notes (Signed)
The patient is an 85 year old female with a history of carcinomatosis.  She was seen in the infusion room today as she was receiving cycle 1, day 1 of paclitaxel and carboplatin.  She was seen for diffuse rash on her upper chest and right breast.  She reports noting this rash prior to starting chemotherapy.  She has a 43-month-old pet that sleeps with her.  She denies changes in personal hygiene products.  She denies pruritus.  She was given a prescription for triamcinolone cream 0.5% that she can use 3 times daily.  She was told to return or call if her rash worsens.  She expresses understanding and agreement with this plan.  Sandi Mealy, MHS, PA-C Physician Assistant

## 2019-04-22 NOTE — Progress Notes (Signed)
[id number]: Referral Late entry Patient was referred to study while in clinic for MD appointment on 04/18/2019. Dr. Alvy Bimler had informed patient of the study and patient expressed interest in study. I spoke with patient, her daughter and daughter-in-law about the study and provided them with the study consent form to take home and review. All patient and family's questions answered to their satisfaction and were thanked for their time. Patient was provided with my contact information that day and encouraged to call me with questions.   Voicemail message received this morning: Patient's daughter-in-law lvm with me over the weekend and informed me that they reviewed the information provided and patient is declining participation with study. I met with patient this morning, while in clinic for the start of her treatment, and thanked her for her time and consideration of study.  Maxwell Marion, RN, BSN, [facility] Clinical Research 04/22/2019 11:25 AM

## 2019-04-23 ENCOUNTER — Telehealth: Payer: Self-pay | Admitting: *Deleted

## 2019-04-23 NOTE — Telephone Encounter (Signed)
Called pt to see how she did with her treatment yest.  She states no n/v, diarrhea/constipation, but sweated a lot last two nights. She also has some problem with her R shoulder hurting.  She thinks she may have slept on it or something.  Encouraged to try some tylenol, heat or cold & see if that makes it better.  She reports knowing how to reach [place] & will call with any concerns.  She thinks her rash is getting better.

## 2019-04-23 NOTE — Telephone Encounter (Signed)
-----   Message from Lillia Corporal, RN sent at 04/22/2019  4:01 PM EDT ----- Regarding: Dr Alvy Bimler first time chemo First time taxol/carboplatin.

## 2019-04-24 ENCOUNTER — Telehealth: Payer: Self-pay

## 2019-04-24 ENCOUNTER — Telehealth: Payer: Self-pay | Admitting: Medical Oncology

## 2019-04-24 NOTE — Telephone Encounter (Signed)
[employer] Follow up call to patient to complete data collection for study. I informed patient that there were a few questions, that the study was asking, that I could not find in her medical history.  I inquired with patient if she had a few minutes to answer them with me over the phone and patient confirmed she did.  Patient was able to provide me with the following information:  Any use of contraceptives: no Hormone Therapy: patient confirms use of Premarin and progesterone, for approximately 10 years, per her recollection.  Use of fertility drugs: no Date of tubal ligation: patient reports she would have had just after her youngest child was born, in [birth year].  All other study required information was collected from patient's medical records in [place].   Patient was thanked for her time and availability and support of study. I encouraged her to call me with study questions, should she have any. Maxwell Marion, RN, BSN, [facility] Clinical Research 04/24/2019 2:16 PM

## 2019-04-24 NOTE — Telephone Encounter (Signed)
TC from Whitney Floyd in reference to Whitney Floyd. Stating she had precise instructions from Dr. Alvy Bimler about preventing constipation. Whitney Floyd's Daughter in law stated that Whitney Floyd had loose stools all day yesterday, and today has not moved her bowels she stated that she gave her miralax and ducolax. She stated she couldn't remember what Dr. Alvy Bimler told her, she stated she has it written down but it doesn't make sense to her. Looked at Dr. Alvy Bimler notes mention the prevention of constipation but not the regimen. Informed Whitney Floyd's daughter in law to give her the miralax and to give a call tomorrow if Whitney Floyd. Did not move her bowels. Whitney Floyd's daughter in law verbalized understanding.

## 2019-04-25 ENCOUNTER — Inpatient Hospital Stay (HOSPITAL_BASED_OUTPATIENT_CLINIC_OR_DEPARTMENT_OTHER): Payer: Medicare HMO | Admitting: Hematology and Oncology

## 2019-04-25 ENCOUNTER — Telehealth: Payer: Self-pay

## 2019-04-25 ENCOUNTER — Other Ambulatory Visit: Payer: Self-pay | Admitting: Hematology and Oncology

## 2019-04-25 ENCOUNTER — Other Ambulatory Visit: Payer: Self-pay

## 2019-04-25 ENCOUNTER — Encounter: Payer: Self-pay | Admitting: Hematology and Oncology

## 2019-04-25 ENCOUNTER — Ambulatory Visit: Payer: Medicare HMO

## 2019-04-25 VITALS — BP 102/57 | HR 94 | Resp 18

## 2019-04-25 DIAGNOSIS — G893 Neoplasm related pain (acute) (chronic): Secondary | ICD-10-CM | POA: Diagnosis not present

## 2019-04-25 DIAGNOSIS — E860 Dehydration: Secondary | ICD-10-CM

## 2019-04-25 DIAGNOSIS — C786 Secondary malignant neoplasm of retroperitoneum and peritoneum: Secondary | ICD-10-CM | POA: Diagnosis not present

## 2019-04-25 DIAGNOSIS — C800 Disseminated malignant neoplasm, unspecified: Secondary | ICD-10-CM

## 2019-04-25 DIAGNOSIS — R109 Unspecified abdominal pain: Secondary | ICD-10-CM

## 2019-04-25 DIAGNOSIS — R64 Cachexia: Secondary | ICD-10-CM

## 2019-04-25 DIAGNOSIS — D61818 Other pancytopenia: Secondary | ICD-10-CM | POA: Diagnosis not present

## 2019-04-25 DIAGNOSIS — I480 Paroxysmal atrial fibrillation: Secondary | ICD-10-CM

## 2019-04-25 DIAGNOSIS — C562 Malignant neoplasm of left ovary: Secondary | ICD-10-CM

## 2019-04-25 DIAGNOSIS — R531 Weakness: Secondary | ICD-10-CM | POA: Diagnosis not present

## 2019-04-25 DIAGNOSIS — K5900 Constipation, unspecified: Secondary | ICD-10-CM | POA: Diagnosis not present

## 2019-04-25 DIAGNOSIS — Z5111 Encounter for antineoplastic chemotherapy: Secondary | ICD-10-CM | POA: Diagnosis not present

## 2019-04-25 DIAGNOSIS — R0602 Shortness of breath: Secondary | ICD-10-CM | POA: Diagnosis not present

## 2019-04-25 MED ORDER — SODIUM CHLORIDE 0.9 % IV SOLN
Freq: Once | INTRAVENOUS | Status: AC
  Filled 2019-04-25: qty 250

## 2019-04-25 MED ORDER — PROMETHAZINE HCL 25 MG/ML IJ SOLN
12.5000 mg | Freq: Once | INTRAMUSCULAR | Status: AC
  Administered 2019-04-25: 14:00:00 12.5 mg via INTRAVENOUS

## 2019-04-25 MED ORDER — PROMETHAZINE HCL 25 MG/ML IJ SOLN
INTRAMUSCULAR | Status: AC
  Filled 2019-04-25: qty 1

## 2019-04-25 NOTE — Assessment & Plan Note (Signed)
Her symptoms of altered bowel habits are likely due to carcinomatosis I do not expect the chemotherapy to work right away She has no signs to suggest imminent bowel obstruction I will continue aggressive supportive care and see her next week for further follow-up

## 2019-04-25 NOTE — Assessment & Plan Note (Signed)
She is clinically dehydrated from poor oral intake I recommend daily IV fluid support

## 2019-04-25 NOTE — Assessment & Plan Note (Signed)
Her generalized weakness and fatigue is due to recent medication side effects I recommend aggressive supportive care with IV fluids daily and low-dose dexamethasone

## 2019-04-25 NOTE — Assessment & Plan Note (Signed)
She had multiple different symptoms which are not expected side effects from treatment I recommend IV fluid hydration daily for the next 3 days I will see her back early next week for further follow-up We will continue aggressive supportive care

## 2019-04-25 NOTE — Progress Notes (Signed)
[facility] OFFICE PROGRESS NOTE  Patient Care Team: Ernestene Kiel, MD as PCP - General (Internal Medicine) Berniece Salines, DO as PCP - Cardiology (Cardiology) Awanda Mink Craige Cotta, RN as Oncology Nurse Navigator (Oncology)  ASSESSMENT & PLAN:  Ovarian cancer, left [facility]) She had multiple different symptoms which are not expected side effects from treatment I recommend IV fluid hydration daily for the next 3 days I will see her back early next week for further follow-up We will continue aggressive supportive care  Carcinomatosis [facility]) Her symptoms of altered bowel habits are likely due to carcinomatosis I do not expect the chemotherapy to work right away She has no signs to suggest imminent bowel obstruction I will continue aggressive supportive care and see her next week for further follow-up  Atrial fibrillation [facility] She has intermittent atrial fibrillation secondary to poor oral intake and dehydration I will reassess next week She will continue medical management  Abdominal pain This is related to partial constipation and abdominal carcinomatosis We discussed the importance of avoiding being constipated with aggressive laxative therapy I also recommend small doses of pain medicine to use as needed and we discussed potential risk of side effects of narcotic prescription   Dehydration She is clinically dehydrated from poor oral intake I recommend daily IV fluid support  Malignant cachexia ([place]) Her poor oral intake is secondary to carcinomatosis, dehydration and changes in her bowel habits I recommend a trial of low-dose dexamethasone daily  Generalized weakness Her generalized weakness and fatigue is due to recent medication side effects I recommend aggressive supportive care with IV fluids daily and low-dose dexamethasone   No orders of the defined types were placed in this encounter.   All questions were answered. The patient knows to call the clinic with  any problems, questions or concerns. The total time spent in the appointment was 40 minutes encounter with patients including review of chart and various tests results, discussions about plan of care and coordination of care plan   Heath Lark, MD 04/25/2019 3:03 PM  INTERVAL HISTORY: Please see below for problem oriented charting. She is seen with her daughter for further follow-up Her daughter called concerning about her wellbeing She has difficulties with constipation over the last day and a half The stool is hard She has poor oral intake yesterday and today Her energy level has steadily declined since Wednesday She have difficulty sleeping Her oral intake is poor Her daughter noticed that even though her blood pressure is okay, her heart rate is mildly elevated and is irregular She continues to have mild intermittent abdominal pain especially in the lower quadrant region  SUMMARY OF ONCOLOGIC HISTORY: Oncology History  Carcinomatosis ([place])  04/03/2019 Initial Diagnosis   Carcinomatosis ([place])   Ovarian cancer, left ([place])  02/07/2019 Imaging   Outside CT chest showed no evidence of metastatic disease   03/20/2019 Imaging   Outside Ct abdomen and pelvis showed diffuse carcinomatosis, left adnexa mass and ascites   04/11/2019 Pathology Results   FINAL MICROSCOPIC DIAGNOSIS:   A. OMENTUM, LEFT ABDOMINAL, NEEDLE CORE BIOPSY:  - Metastatic carcinoma.  See comment    COMMENT:   Immunohistochemical stains show that the tumor cells are positive for PAX8, ER, WT1, CK7 (focal) and CK 5/6 (patchy); and negative for p63, calretinin, D2-40, CK20, GATA3 and CDX2.  This mmunohistochemical profile is consistent with a gynecologic primary and suggestive of a low-grade serous carcinoma.   04/11/2019 Procedure   Image guided core biopsy of the omental disease.  In addition, small amount of ascites was collected for cytology   04/18/2019 Cancer Staging   Staging form: Ovary, Fallopian Tube, and  Primary Peritoneal Carcinoma, AJCC 8th Edition - Clinical stage from 04/18/2019: FIGO Stage IIIC (cT3c, cN0, cM0) - Signed by Heath Lark, MD on 04/18/2019   04/18/2019 Tumor Marker   Patient's tumor was tested for the following markers: CA-125 Results of the tumor marker test revealed 402   04/22/2019 -  Chemotherapy   The patient had carboplatin and taxol for chemotherapy treatment.       REVIEW OF SYSTEMS:   Constitutional: Denies fevers, chills or abnormal weight loss Eyes: Denies blurriness of vision Ears, nose, mouth, throat, and face: Denies mucositis or sore throat Respiratory: Denies cough, dyspnea or wheezes Cardiovascular: Denies palpitation, chest discomfort or lower extremity swelling Skin: Denies abnormal skin rashes Lymphatics: Denies new lymphadenopathy or easy bruising Behavioral/Psych: Mood is stable, no new changes  All other systems were reviewed with the patient and are negative.  I have reviewed the past medical history, past surgical history, social history and family history with the patient and they are unchanged from previous note.  ALLERGIES:  is allergic to codeine sulfate [codeine]; tetanus toxoids; horse-derived products; and sulfa antibiotics.  MEDICATIONS:  Current Outpatient Medications  Medication Sig Dispense Refill  . dexamethasone (DECADRON) 4 MG tablet Take 2 tabs at the night before and 2 tab the morning of chemotherapy, every 3 weeks, by mouth 24 tablet 6  . diltiazem (CARDIZEM) 120 MG tablet Take 120 mg by mouth daily.    Marland Kitchen ELIQUIS 5 MG TABS tablet Take 5 mg by mouth 2 (two) times daily.    . metoprolol succinate (TOPROL XL) 25 MG 24 hr tablet Take 1.5 tablets (37.5 mg total) by mouth daily. 135 tablet 1  . omeprazole (PRILOSEC) 20 MG capsule Take 20 mg by mouth 2 (two) times daily before a meal.     . ondansetron (ZOFRAN) 8 MG tablet Take 1 tablet (8 mg total) by mouth every 8 (eight) hours as needed for refractory nausea / vomiting. 30 tablet  1  . oxyCODONE (OXY IR/ROXICODONE) 5 MG immediate release tablet Take 1 tablet (5 mg total) by mouth every 4 (four) hours as needed for severe pain. 30 tablet 0  . prochlorperazine (COMPAZINE) 10 MG tablet Take 1 tablet (10 mg total) by mouth every 6 (six) hours as needed (Nausea or vomiting). 30 tablet 1  . triamcinolone cream (KENALOG) 0.5 % Apply 1 application topically 3 (three) times daily. 90 g 1   No current facility-administered medications for this visit.    PHYSICAL EXAMINATION: ECOG PERFORMANCE STATUS: 2 - Symptomatic, <50% confined to bed  Vitals:   04/25/19 1308  BP: (!) 101/57  Pulse: 97  Resp: 18  Temp: 98 F (36.7 C)  SpO2: 99%   Filed Weights   04/25/19 1308  Weight: 132 lb 6.4 oz (60.1 kg)    GENERAL:alert, no distress and comfortable.  She looks somewhat weak SKIN: skin color, texture, turgor are normal, no rashes or significant lesions EYES: normal, Conjunctiva are pink and non-injected, sclera clear OROPHARYNX:no exudate, no erythema and lips, buccal mucosa, and tongue normal  NECK: supple, thyroid normal size, non-tender, without nodularity LYMPH:  no palpable lymphadenopathy in the cervical, axillary or inguinal LUNGS: clear to auscultation and percussion with normal breathing effort HEART: Mild tachycardia with irregular rate and rhythm,no murmurs and no lower extremity edema ABDOMEN:abdomen soft, non-tender and normal bowel sounds.  No ascites  on exam Musculoskeletal:no cyanosis of digits and no clubbing  NEURO: alert & oriented x 3 with fluent speech, no focal motor/sensory deficits  LABORATORY DATA:  I have reviewed the data as listed    Component Value Date/Time   NA 137 04/18/2019 1504   NA 137 03/04/2019 1107   K 3.9 04/18/2019 1504   CL 106 04/18/2019 1504   CO2 23 04/18/2019 1504   GLUCOSE 105 (H) 04/18/2019 1504   BUN 18 04/18/2019 1504   BUN 11 03/04/2019 1107   CREATININE 0.78 04/18/2019 1504   CALCIUM 8.7 (L) 04/18/2019 1504    PROT 7.8 04/18/2019 1504   PROT 7.4 03/04/2019 1107   ALBUMIN 2.8 (L) 04/18/2019 1504   ALBUMIN 3.6 03/04/2019 1107   AST 112 (H) 04/18/2019 1504   ALT 50 (H) 04/18/2019 1504   ALKPHOS 59 04/18/2019 1504   BILITOT 0.3 04/18/2019 1504   GFRNONAA >60 04/18/2019 1504   GFRAA >60 04/18/2019 1504    No results found for: SPEP, UPEP  Lab Results  Component Value Date   WBC 7.6 04/18/2019   NEUTROABS 5.6 04/18/2019   HGB 11.4 (L) 04/18/2019   HCT 35.3 (L) 04/18/2019   MCV 88.9 04/18/2019   PLT 335 04/18/2019      Chemistry      Component Value Date/Time   NA 137 04/18/2019 1504   NA 137 03/04/2019 1107   K 3.9 04/18/2019 1504   CL 106 04/18/2019 1504   CO2 23 04/18/2019 1504   BUN 18 04/18/2019 1504   BUN 11 03/04/2019 1107   CREATININE 0.78 04/18/2019 1504      Component Value Date/Time   CALCIUM 8.7 (L) 04/18/2019 1504   ALKPHOS 59 04/18/2019 1504   AST 112 (H) 04/18/2019 1504   ALT 50 (H) 04/18/2019 1504   BILITOT 0.3 04/18/2019 1504       RADIOGRAPHIC STUDIES: I have personally reviewed the radiological images as listed and agreed with the findings in the report. CT BIOPSY  Result Date: 04/11/2019 INDICATION: 85 year old with evidence of diffuse omental and peritoneal disease. Tissue diagnosis is needed. EXAM: IMAGE GUIDED OMENTAL BIOPSY AND FLUID ASPIRATION MEDICATIONS: None. ANESTHESIA/SEDATION: Moderate (conscious) sedation was employed during this procedure. A total of Versed 2.0 mg and Fentanyl 100 mcg was administered intravenously. Moderate Sedation Time: 21 minutes. The patient's level of consciousness and vital signs were monitored continuously by radiology nursing throughout the procedure under my direct supervision. FLUOROSCOPY TIME:  Fluoroscopy Time: None COMPLICATIONS: None immediate. PROCEDURE: Informed written consent was obtained from the patient after a thorough discussion of the procedural risks, benefits and alternatives. All questions were addressed.  A timeout was performed prior to the initiation of the procedure. Patient was placed supine. CT images through the abdomen were obtained. Omental disease in the left anterior abdomen was identified and targeted. This area was also evaluated with ultrasound. Skin was prepped with chlorhexidine and sterile field was created. Skin was anesthetized with 1% lidocaine. Small incision was made. 17 gauge coaxial needle was directed into the omental thickening using ultrasound guidance. Needle position was confirmed with CT. Total of 4 core biopsies were obtained under ultrasound guidance. Specimens placed in saline. In addition, approximately 30 mL of red serous ascites was aspirated from the 17 gauge needle prior to removal. Bandage placed over the puncture site. FINDINGS: Extensive omental thickening in the left anterior abdomen. Needle position confirmed within the omental disease with CT and ultrasound. Four adequate core biopsies were obtained. Small amount  of ascites was also collected for cytology. IMPRESSION: Image guided core biopsy of the omental disease. In addition, small amount of ascites was collected for cytology. Electronically Signed   By: Markus Daft M.D.   On: 04/11/2019 10:30

## 2019-04-25 NOTE — Telephone Encounter (Signed)
Continued note computer stopped working. She started miralax yesterday daily. Instructed to take miralax BID and start senokot. Blood pressure is 110/56 and heart rate is irregular at 88-128. She was able to take medications today. Given appt for 1 pm today. Daughter verbalized understanding.

## 2019-04-25 NOTE — Telephone Encounter (Signed)
Daughter called and left a message to call her.  Called back. Her Mom is feeling weak and like she may pass out. She is not able to eat today. She has had a protein shake and daughter will push oral fluids. She is complaining of constipation. She passed x 2 hard small stools. She started [employer]

## 2019-04-25 NOTE — Patient Instructions (Signed)

## 2019-04-25 NOTE — Assessment & Plan Note (Signed)
This is related to partial constipation and abdominal carcinomatosis We discussed the importance of avoiding being constipated with aggressive laxative therapy I also recommend small doses of pain medicine to use as needed and we discussed potential risk of side effects of narcotic prescription

## 2019-04-25 NOTE — Assessment & Plan Note (Signed)
Her poor oral intake is secondary to carcinomatosis, dehydration and changes in her bowel habits I recommend a trial of low-dose dexamethasone daily

## 2019-04-25 NOTE — Assessment & Plan Note (Signed)
She has intermittent atrial fibrillation secondary to poor oral intake and dehydration I will reassess next week She will continue medical management

## 2019-04-26 ENCOUNTER — Other Ambulatory Visit: Payer: Self-pay

## 2019-04-26 ENCOUNTER — Inpatient Hospital Stay: Payer: Medicare HMO

## 2019-04-26 VITALS — BP 114/62 | HR 86 | Temp 98.7°F | Resp 18

## 2019-04-26 DIAGNOSIS — I480 Paroxysmal atrial fibrillation: Secondary | ICD-10-CM | POA: Diagnosis not present

## 2019-04-26 DIAGNOSIS — D61818 Other pancytopenia: Secondary | ICD-10-CM | POA: Diagnosis not present

## 2019-04-26 DIAGNOSIS — R0602 Shortness of breath: Secondary | ICD-10-CM | POA: Diagnosis not present

## 2019-04-26 DIAGNOSIS — E860 Dehydration: Secondary | ICD-10-CM | POA: Diagnosis not present

## 2019-04-26 DIAGNOSIS — Z5111 Encounter for antineoplastic chemotherapy: Secondary | ICD-10-CM | POA: Diagnosis not present

## 2019-04-26 DIAGNOSIS — R64 Cachexia: Secondary | ICD-10-CM | POA: Diagnosis not present

## 2019-04-26 DIAGNOSIS — G893 Neoplasm related pain (acute) (chronic): Secondary | ICD-10-CM | POA: Diagnosis not present

## 2019-04-26 DIAGNOSIS — C562 Malignant neoplasm of left ovary: Secondary | ICD-10-CM

## 2019-04-26 DIAGNOSIS — C786 Secondary malignant neoplasm of retroperitoneum and peritoneum: Secondary | ICD-10-CM | POA: Diagnosis not present

## 2019-04-26 DIAGNOSIS — K5900 Constipation, unspecified: Secondary | ICD-10-CM | POA: Diagnosis not present

## 2019-04-26 DIAGNOSIS — C800 Disseminated malignant neoplasm, unspecified: Secondary | ICD-10-CM

## 2019-04-26 MED ORDER — SODIUM CHLORIDE 0.9 % IV SOLN
Freq: Once | INTRAVENOUS | Status: AC
  Filled 2019-04-26: qty 250

## 2019-04-26 NOTE — Patient Instructions (Signed)

## 2019-04-27 ENCOUNTER — Inpatient Hospital Stay: Payer: Medicare HMO

## 2019-04-27 VITALS — BP 98/57 | HR 79 | Temp 97.8°F | Resp 18

## 2019-04-27 DIAGNOSIS — E860 Dehydration: Secondary | ICD-10-CM | POA: Diagnosis not present

## 2019-04-27 DIAGNOSIS — D61818 Other pancytopenia: Secondary | ICD-10-CM | POA: Diagnosis not present

## 2019-04-27 DIAGNOSIS — G893 Neoplasm related pain (acute) (chronic): Secondary | ICD-10-CM | POA: Diagnosis not present

## 2019-04-27 DIAGNOSIS — C786 Secondary malignant neoplasm of retroperitoneum and peritoneum: Secondary | ICD-10-CM | POA: Diagnosis not present

## 2019-04-27 DIAGNOSIS — R64 Cachexia: Secondary | ICD-10-CM | POA: Diagnosis not present

## 2019-04-27 DIAGNOSIS — R0602 Shortness of breath: Secondary | ICD-10-CM | POA: Diagnosis not present

## 2019-04-27 DIAGNOSIS — I480 Paroxysmal atrial fibrillation: Secondary | ICD-10-CM | POA: Diagnosis not present

## 2019-04-27 DIAGNOSIS — Z5111 Encounter for antineoplastic chemotherapy: Secondary | ICD-10-CM | POA: Diagnosis not present

## 2019-04-27 DIAGNOSIS — C562 Malignant neoplasm of left ovary: Secondary | ICD-10-CM

## 2019-04-27 DIAGNOSIS — C800 Disseminated malignant neoplasm, unspecified: Secondary | ICD-10-CM

## 2019-04-27 DIAGNOSIS — K5900 Constipation, unspecified: Secondary | ICD-10-CM | POA: Diagnosis not present

## 2019-04-27 MED ORDER — SODIUM CHLORIDE 0.9 % IV SOLN
Freq: Once | INTRAVENOUS | Status: AC
  Filled 2019-04-27: qty 250

## 2019-04-27 NOTE — Patient Instructions (Signed)

## 2019-04-29 ENCOUNTER — Inpatient Hospital Stay: Payer: Medicare HMO | Admitting: Hematology and Oncology

## 2019-04-29 ENCOUNTER — Inpatient Hospital Stay: Payer: Medicare HMO

## 2019-04-29 ENCOUNTER — Other Ambulatory Visit: Payer: Self-pay | Admitting: Oncology

## 2019-04-29 ENCOUNTER — Other Ambulatory Visit: Payer: Self-pay

## 2019-04-29 DIAGNOSIS — R109 Unspecified abdominal pain: Secondary | ICD-10-CM

## 2019-04-29 DIAGNOSIS — I480 Paroxysmal atrial fibrillation: Secondary | ICD-10-CM | POA: Diagnosis not present

## 2019-04-29 DIAGNOSIS — C562 Malignant neoplasm of left ovary: Secondary | ICD-10-CM

## 2019-04-29 DIAGNOSIS — C800 Disseminated malignant neoplasm, unspecified: Secondary | ICD-10-CM | POA: Diagnosis not present

## 2019-04-29 DIAGNOSIS — C786 Secondary malignant neoplasm of retroperitoneum and peritoneum: Secondary | ICD-10-CM | POA: Diagnosis not present

## 2019-04-29 DIAGNOSIS — R531 Weakness: Secondary | ICD-10-CM | POA: Diagnosis not present

## 2019-04-29 DIAGNOSIS — D61818 Other pancytopenia: Secondary | ICD-10-CM | POA: Diagnosis not present

## 2019-04-29 DIAGNOSIS — K5900 Constipation, unspecified: Secondary | ICD-10-CM | POA: Diagnosis not present

## 2019-04-29 DIAGNOSIS — R64 Cachexia: Secondary | ICD-10-CM | POA: Diagnosis not present

## 2019-04-29 DIAGNOSIS — Z5111 Encounter for antineoplastic chemotherapy: Secondary | ICD-10-CM | POA: Diagnosis not present

## 2019-04-29 DIAGNOSIS — E860 Dehydration: Secondary | ICD-10-CM

## 2019-04-29 DIAGNOSIS — G893 Neoplasm related pain (acute) (chronic): Secondary | ICD-10-CM | POA: Diagnosis not present

## 2019-04-29 DIAGNOSIS — R0602 Shortness of breath: Secondary | ICD-10-CM | POA: Diagnosis not present

## 2019-04-29 MED ORDER — PROMETHAZINE HCL 25 MG/ML IJ SOLN
12.5000 mg | Freq: Every day | INTRAMUSCULAR | Status: DC | PRN
  Administered 2019-04-29: 12.5 mg via INTRAVENOUS

## 2019-04-29 MED ORDER — SODIUM CHLORIDE 0.9 % IV SOLN
Freq: Once | INTRAVENOUS | Status: AC
  Filled 2019-04-29: qty 250

## 2019-04-29 MED ORDER — PROMETHAZINE HCL 25 MG/ML IJ SOLN
INTRAMUSCULAR | Status: AC
  Filled 2019-04-29: qty 1

## 2019-04-29 NOTE — Progress Notes (Signed)
Gynecologic Oncology Multi-Disciplinary Disposition Conference Note  Date of the Conference: 04/29/2019  Patient Name: Whitney Floyd  Referring Provider: Dr. Mariel Sleet Primary GYN Oncologist: Dr. 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 Disposition is to neo-adjuvant chemotherapy followed by imaging and consideration for interval debulking surgery..   This Multidisciplinary conference took place involving physicians from [place], Medical Oncology, Radiation Oncology, Pathology, Radiology along with the Gynecologic Oncology Nurse Practitioner and RN.  Comprehensive assessment of the patient's malignancy, staging, need for surgery, chemotherapy, radiation therapy, and need for further testing were reviewed. Supportive measures, both inpatient and following discharge were also discussed. The recommended plan of care is documented. Greater than 35 minutes were spent correlating and coordinating this patient's care.

## 2019-04-29 NOTE — Patient Instructions (Signed)

## 2019-04-30 ENCOUNTER — Ambulatory Visit (HOSPITAL_BASED_OUTPATIENT_CLINIC_OR_DEPARTMENT_OTHER): Payer: Medicare HMO | Admitting: Medical

## 2019-04-30 ENCOUNTER — Inpatient Hospital Stay: Payer: Medicare HMO

## 2019-04-30 ENCOUNTER — Other Ambulatory Visit: Payer: Self-pay

## 2019-04-30 ENCOUNTER — Encounter: Payer: Self-pay | Admitting: Hematology and Oncology

## 2019-04-30 ENCOUNTER — Other Ambulatory Visit: Payer: Self-pay | Admitting: Medical

## 2019-04-30 VITALS — BP 115/59 | HR 87 | Temp 99.3°F | Resp 18

## 2019-04-30 DIAGNOSIS — C786 Secondary malignant neoplasm of retroperitoneum and peritoneum: Secondary | ICD-10-CM | POA: Diagnosis not present

## 2019-04-30 DIAGNOSIS — G893 Neoplasm related pain (acute) (chronic): Secondary | ICD-10-CM | POA: Diagnosis not present

## 2019-04-30 DIAGNOSIS — C562 Malignant neoplasm of left ovary: Secondary | ICD-10-CM

## 2019-04-30 DIAGNOSIS — C800 Disseminated malignant neoplasm, unspecified: Secondary | ICD-10-CM

## 2019-04-30 DIAGNOSIS — K625 Hemorrhage of anus and rectum: Secondary | ICD-10-CM

## 2019-04-30 DIAGNOSIS — E860 Dehydration: Secondary | ICD-10-CM | POA: Diagnosis not present

## 2019-04-30 DIAGNOSIS — D61818 Other pancytopenia: Secondary | ICD-10-CM | POA: Diagnosis not present

## 2019-04-30 DIAGNOSIS — K5900 Constipation, unspecified: Secondary | ICD-10-CM | POA: Diagnosis not present

## 2019-04-30 DIAGNOSIS — I480 Paroxysmal atrial fibrillation: Secondary | ICD-10-CM | POA: Diagnosis not present

## 2019-04-30 DIAGNOSIS — Z5111 Encounter for antineoplastic chemotherapy: Secondary | ICD-10-CM | POA: Diagnosis not present

## 2019-04-30 DIAGNOSIS — R0602 Shortness of breath: Secondary | ICD-10-CM | POA: Diagnosis not present

## 2019-04-30 DIAGNOSIS — R64 Cachexia: Secondary | ICD-10-CM | POA: Diagnosis not present

## 2019-04-30 MED ORDER — SODIUM CHLORIDE 0.9 % IV SOLN
Freq: Once | INTRAVENOUS | Status: AC
  Filled 2019-04-30: qty 250

## 2019-04-30 NOTE — Assessment & Plan Note (Signed)
Clinically, she is very frail She have frequent loose stool and difficulties with oral intake and weak I recommend continue daily IV fluid support along with IV antiemetics as needed I encouraged her to continue to eat frequent small meals as tolerated I plan to reassess again in 3 days for further follow-up and supportive care

## 2019-04-30 NOTE — Assessment & Plan Note (Signed)
She is profoundly distressed because she is not getting better I told the patient it could be a slow process before we can get her situation improve as she only started on chemotherapy a week ago I do believe that she is slightly improved compared to last week I recommend daily IV fluid support along with IV antiemetics to treat her symptoms Her daughter brought up the possibility of consulting advanced home care service for her to get IV support at home We discussed the risk and benefit of this and the patient ultimately decided to return here on a daily basis instead of being treated at home

## 2019-04-30 NOTE — Assessment & Plan Note (Signed)
Her symptoms of altered bowel habits are likely due to carcinomatosis I do not expect the chemotherapy to work right away She has no signs to suggest imminent bowel obstruction I will continue aggressive supportive care and see her again in a few days for further follow-up The frequent loose stool that she is describing is not necessarily a form of diarrhea, rather, it could be a positive sign that her carcinomatosis is resolving I do not recommend her to take Imodium

## 2019-04-30 NOTE — Progress Notes (Signed)
[place] OFFICE PROGRESS NOTE  Patient Care Team: Ernestene Kiel, MD as PCP - General (Internal Medicine) Berniece Salines, DO as PCP - Cardiology (Cardiology) Awanda Mink Craige Cotta, RN as Oncology Nurse Navigator (Oncology)  ASSESSMENT & PLAN:  Ovarian cancer, left [facility]) Clinically, she is very frail She have frequent loose stool and difficulties with oral intake and weak I recommend continue daily IV fluid support along with IV antiemetics as needed I encouraged her to continue to eat frequent small meals as tolerated I plan to reassess again in 3 days for further follow-up and supportive care  Carcinomatosis [facility]) Her symptoms of altered bowel habits are likely due to carcinomatosis I do not expect the chemotherapy to work right away She has no signs to suggest imminent bowel obstruction I will continue aggressive supportive care and see her again in a few days for further follow-up The frequent loose stool that she is describing is not necessarily a form of diarrhea, rather, it could be a positive sign that her carcinomatosis is resolving I do not recommend her to take Imodium  Atrial fibrillation ([place]) She continues to have irregular heartbeat but the rate has improved since aggressive IV fluid support Clinically, she has no signs or symptoms of suggest congestive heart failure I recommend she continue to take her medication as prescribed and I will continue to watch carefully for worsening heart rhythm control  Abdominal pain This is related to partial constipation and abdominal carcinomatosis We discussed the importance of avoiding being constipated with aggressive laxative therapy I also recommend small doses of pain medicine to use as needed  Malignant cachexia [facility]) She is very distressed about her inability to eat adequate meals She did not find dexamethasone particularly helpful Spent a lot of time encouraging the patient to be creative with her oral intake  and to drink more nutritional supplemental drinks if she can tolerate that better I will reassess again in a few days I will get dietitian to see her  Generalized weakness She is profoundly distressed because she is not getting better I told the patient it could be a slow process before we can get her situation improve as she only started on chemotherapy a week ago I do believe that she is slightly improved compared to last week I recommend daily IV fluid support along with IV antiemetics to treat her symptoms Her daughter brought up the possibility of consulting advanced home care service for her to get IV support at home We discussed the risk and benefit of this and the patient ultimately decided to return here on a daily basis instead of being treated at home   No orders of the defined types were placed in this encounter.   All questions were answered. The patient knows to call the clinic with any problems, questions or concerns. The total time spent in the appointment was 40 minutes encounter with patients including review of chart and various tests results, discussions about plan of care and coordination of care plan   Heath Lark, MD 04/30/2019 8:33 AM  INTERVAL HISTORY: Please see below for problem oriented charting. She returns with her daughter for further follow-up She is quite distressed because she does not feel good Over the weekend, and she was receiving IV fluid support, she was doing well On Sunday, she did not receive IV fluid support Her intake was very poor Starting on Friday, she started to have diarrhea What she described was very frequent bowel movement, at least 5-6  times a day She continues to have mild tenderness at the prior biopsy site No recent fever or chills Denies chest pain, shortness of breath or dizziness Her family confirmed that her oral intake is not great and she does not have much appetite She has occasional nausea SUMMARY OF ONCOLOGIC  HISTORY: Oncology History  Carcinomatosis ([place])  04/03/2019 Initial Diagnosis   Carcinomatosis ([place])   Ovarian cancer, left ([place])  02/07/2019 Imaging   Outside CT chest showed no evidence of metastatic disease   03/20/2019 Imaging   Outside Ct abdomen and pelvis showed diffuse carcinomatosis, left adnexa mass and ascites   04/11/2019 Pathology Results   FINAL MICROSCOPIC DIAGNOSIS:   A. OMENTUM, LEFT ABDOMINAL, NEEDLE CORE BIOPSY:  - Metastatic carcinoma.  See comment    COMMENT:   Immunohistochemical stains show that the tumor cells are positive for PAX8, ER, WT1, CK7 (focal) and CK 5/6 (patchy); and negative for p63, calretinin, D2-40, CK20, GATA3 and CDX2.  This mmunohistochemical profile is consistent with a gynecologic primary and suggestive of a low-grade serous carcinoma.   04/11/2019 Procedure   Image guided core biopsy of the omental disease. In addition, small amount of ascites was collected for cytology   04/18/2019 Cancer Staging   Staging form: Ovary, Fallopian Tube, and Primary Peritoneal Carcinoma, AJCC 8th Edition - Clinical stage from 04/18/2019: FIGO Stage IIIC (cT3c, cN0, cM0) - Signed by Heath Lark, MD on 04/18/2019   04/18/2019 Tumor Marker   Patient's tumor was tested for the following markers: CA-125 Results of the tumor marker test revealed 402   04/22/2019 -  Chemotherapy   The patient had carboplatin and taxol for chemotherapy treatment.       REVIEW OF SYSTEMS:   Eyes: Denies blurriness of vision Ears, nose, mouth, throat, and face: Denies mucositis or sore throat Respiratory: Denies cough, dyspnea or wheezes Cardiovascular: Denies palpitation, chest discomfort or lower extremity swelling Skin: Denies abnormal skin rashes Lymphatics: Denies new lymphadenopathy or easy bruising Behavioral/Psych: Mood is stable, no new changes  All other systems were reviewed with the patient and are negative.  I have reviewed the past medical history, past surgical  history, social history and family history with the patient and they are unchanged from previous note.  ALLERGIES:  is allergic to codeine sulfate [codeine]; tetanus toxoids; horse-derived products; and sulfa antibiotics.  MEDICATIONS:  Current Outpatient Medications  Medication Sig Dispense Refill  . dexamethasone (DECADRON) 4 MG tablet Take 2 tabs at the night before and 2 tab the morning of chemotherapy, every 3 weeks, by mouth 24 tablet 6  . diltiazem (CARDIZEM) 120 MG tablet Take 120 mg by mouth daily.    Marland Kitchen ELIQUIS 5 MG TABS tablet Take 5 mg by mouth 2 (two) times daily.    . metoprolol succinate (TOPROL XL) 25 MG 24 hr tablet Take 1.5 tablets (37.5 mg total) by mouth daily. 135 tablet 1  . omeprazole (PRILOSEC) 20 MG capsule Take 20 mg by mouth 2 (two) times daily before a meal.     . ondansetron (ZOFRAN) 8 MG tablet Take 1 tablet (8 mg total) by mouth every 8 (eight) hours as needed for refractory nausea / vomiting. 30 tablet 1  . oxyCODONE (OXY IR/ROXICODONE) 5 MG immediate release tablet Take 1 tablet (5 mg total) by mouth every 4 (four) hours as needed for severe pain. 30 tablet 0  . prochlorperazine (COMPAZINE) 10 MG tablet Take 1 tablet (10 mg total) by mouth every 6 (six) hours as needed (  Nausea or vomiting). 30 tablet 1  . triamcinolone cream (KENALOG) 0.5 % Apply 1 application topically 3 (three) times daily. 90 g 1   No current facility-administered medications for this visit.    PHYSICAL EXAMINATION: ECOG PERFORMANCE STATUS: 2 - Symptomatic, <50% confined to bed  Vitals:   04/29/19 0858  BP: 111/74  Pulse: 69  Resp: 17  Temp: 98.1 F (36.7 C)  SpO2: 99%   Filed Weights   04/29/19 0858  Weight: 126 lb 1.6 oz (57.2 kg)    GENERAL:alert, no distress and comfortable.  She is somewhat frail SKIN: skin color, texture, turgor are normal, no rashes or significant lesions EYES: normal, Conjunctiva are pink and non-injected, sclera clear OROPHARYNX:no exudate, no  erythema and lips, buccal mucosa, and tongue normal  NECK: supple, thyroid normal size, non-tender, without nodularity LYMPH:  no palpable lymphadenopathy in the cervical, axillary or inguinal LUNGS: clear to auscultation and percussion with normal breathing effort HEART: irregular rate & rhythm and no murmurs and no lower extremity edema ABDOMEN:abdomen soft, mildly distended with active bowel sounds Musculoskeletal:no cyanosis of digits and no clubbing  NEURO: alert & oriented x 3 with fluent speech, no focal motor/sensory deficits  LABORATORY DATA:  I have reviewed the data as listed    Component Value Date/Time   NA 137 04/18/2019 1504   NA 137 03/04/2019 1107   K 3.9 04/18/2019 1504   CL 106 04/18/2019 1504   CO2 23 04/18/2019 1504   GLUCOSE 105 (H) 04/18/2019 1504   BUN 18 04/18/2019 1504   BUN 11 03/04/2019 1107   CREATININE 0.78 04/18/2019 1504   CALCIUM 8.7 (L) 04/18/2019 1504   PROT 7.8 04/18/2019 1504   PROT 7.4 03/04/2019 1107   ALBUMIN 2.8 (L) 04/18/2019 1504   ALBUMIN 3.6 03/04/2019 1107   AST 112 (H) 04/18/2019 1504   ALT 50 (H) 04/18/2019 1504   ALKPHOS 59 04/18/2019 1504   BILITOT 0.3 04/18/2019 1504   GFRNONAA >60 04/18/2019 1504   GFRAA >60 04/18/2019 1504    No results found for: SPEP, UPEP  Lab Results  Component Value Date   WBC 7.6 04/18/2019   NEUTROABS 5.6 04/18/2019   HGB 11.4 (L) 04/18/2019   HCT 35.3 (L) 04/18/2019   MCV 88.9 04/18/2019   PLT 335 04/18/2019      Chemistry      Component Value Date/Time   NA 137 04/18/2019 1504   NA 137 03/04/2019 1107   K 3.9 04/18/2019 1504   CL 106 04/18/2019 1504   CO2 23 04/18/2019 1504   BUN 18 04/18/2019 1504   BUN 11 03/04/2019 1107   CREATININE 0.78 04/18/2019 1504      Component Value Date/Time   CALCIUM 8.7 (L) 04/18/2019 1504   ALKPHOS 59 04/18/2019 1504   AST 112 (H) 04/18/2019 1504   ALT 50 (H) 04/18/2019 1504   BILITOT 0.3 04/18/2019 1504       RADIOGRAPHIC STUDIES: I have  personally reviewed the radiological images as listed and agreed with the findings in the report. CT BIOPSY  Result Date: 04/11/2019 INDICATION: 85 year old with evidence of diffuse omental and peritoneal disease. Tissue diagnosis is needed. EXAM: IMAGE GUIDED OMENTAL BIOPSY AND FLUID ASPIRATION MEDICATIONS: None. ANESTHESIA/SEDATION: Moderate (conscious) sedation was employed during this procedure. A total of Versed 2.0 mg and Fentanyl 100 mcg was administered intravenously. Moderate Sedation Time: 21 minutes. The patient's level of consciousness and vital signs were monitored continuously by radiology nursing throughout the procedure under  my direct supervision. FLUOROSCOPY TIME:  Fluoroscopy Time: None COMPLICATIONS: None immediate. PROCEDURE: Informed written consent was obtained from the patient after a thorough discussion of the procedural risks, benefits and alternatives. All questions were addressed. A timeout was performed prior to the initiation of the procedure. Patient was placed supine. CT images through the abdomen were obtained. Omental disease in the left anterior abdomen was identified and targeted. This area was also evaluated with ultrasound. Skin was prepped with chlorhexidine and sterile field was created. Skin was anesthetized with 1% lidocaine. Small incision was made. 17 gauge coaxial needle was directed into the omental thickening using ultrasound guidance. Needle position was confirmed with CT. Total of 4 core biopsies were obtained under ultrasound guidance. Specimens placed in saline. In addition, approximately 30 mL of red serous ascites was aspirated from the 17 gauge needle prior to removal. Bandage placed over the puncture site. FINDINGS: Extensive omental thickening in the left anterior abdomen. Needle position confirmed within the omental disease with CT and ultrasound. Four adequate core biopsies were obtained. Small amount of ascites was also collected for cytology. IMPRESSION:  Image guided core biopsy of the omental disease. In addition, small amount of ascites was collected for cytology. Electronically Signed   By: Markus Daft M.D.   On: 04/11/2019 10:30

## 2019-04-30 NOTE — Assessment & Plan Note (Signed)
This is related to partial constipation and abdominal carcinomatosis We discussed the importance of avoiding being constipated with aggressive laxative therapy I also recommend small doses of pain medicine to use as needed

## 2019-04-30 NOTE — Assessment & Plan Note (Signed)
She is very distressed about her inability to eat adequate meals She did not find dexamethasone particularly helpful Spent a lot of time encouraging the patient to be creative with her oral intake and to drink more nutritional supplemental drinks if she can tolerate that better I will reassess again in a few days I will get dietitian to see her

## 2019-04-30 NOTE — Progress Notes (Signed)
Pt up to BR after having some abdominal discomfort today. Stools were liquid. Bright red blood noted in toilet and also mixed w/ stool . Foul odor noted w/ BM. Shelia Media PA notified.   Report given to [place] RN

## 2019-04-30 NOTE — Assessment & Plan Note (Signed)
She continues to have irregular heartbeat but the rate has improved since aggressive IV fluid support Clinically, she has no signs or symptoms of suggest congestive heart failure I recommend she continue to take her medication as prescribed and I will continue to watch carefully for worsening heart rhythm control

## 2019-05-01 ENCOUNTER — Inpatient Hospital Stay: Payer: Medicare HMO

## 2019-05-01 ENCOUNTER — Inpatient Hospital Stay: Payer: Medicare HMO | Admitting: Nutrition

## 2019-05-01 ENCOUNTER — Other Ambulatory Visit: Payer: Self-pay

## 2019-05-01 ENCOUNTER — Telehealth: Payer: Self-pay

## 2019-05-01 ENCOUNTER — Telehealth: Payer: Self-pay | Admitting: Oncology

## 2019-05-01 VITALS — BP 113/55 | HR 90 | Temp 99.9°F | Resp 20

## 2019-05-01 DIAGNOSIS — C562 Malignant neoplasm of left ovary: Secondary | ICD-10-CM

## 2019-05-01 DIAGNOSIS — I480 Paroxysmal atrial fibrillation: Secondary | ICD-10-CM | POA: Diagnosis not present

## 2019-05-01 DIAGNOSIS — E860 Dehydration: Secondary | ICD-10-CM

## 2019-05-01 DIAGNOSIS — Z7189 Other specified counseling: Secondary | ICD-10-CM

## 2019-05-01 DIAGNOSIS — R64 Cachexia: Secondary | ICD-10-CM | POA: Diagnosis not present

## 2019-05-01 DIAGNOSIS — R0602 Shortness of breath: Secondary | ICD-10-CM | POA: Diagnosis not present

## 2019-05-01 DIAGNOSIS — G893 Neoplasm related pain (acute) (chronic): Secondary | ICD-10-CM | POA: Diagnosis not present

## 2019-05-01 DIAGNOSIS — Z5111 Encounter for antineoplastic chemotherapy: Secondary | ICD-10-CM | POA: Diagnosis not present

## 2019-05-01 DIAGNOSIS — C786 Secondary malignant neoplasm of retroperitoneum and peritoneum: Secondary | ICD-10-CM | POA: Diagnosis not present

## 2019-05-01 DIAGNOSIS — D61818 Other pancytopenia: Secondary | ICD-10-CM | POA: Diagnosis not present

## 2019-05-01 DIAGNOSIS — C800 Disseminated malignant neoplasm, unspecified: Secondary | ICD-10-CM

## 2019-05-01 DIAGNOSIS — K5900 Constipation, unspecified: Secondary | ICD-10-CM | POA: Diagnosis not present

## 2019-05-01 DIAGNOSIS — R971 Elevated cancer antigen 125 [CA 125]: Secondary | ICD-10-CM

## 2019-05-01 LAB — CMP (CANCER CENTER ONLY)
ALT: 72 U/L — ABNORMAL HIGH (ref 0–44)
AST: 127 U/L — ABNORMAL HIGH (ref 15–41)
Albumin: 2.4 g/dL — ABNORMAL LOW (ref 3.5–5.0)
Alkaline Phosphatase: 76 U/L (ref 38–126)
Anion gap: 8 (ref 5–15)
BUN: 14 mg/dL (ref 8–23)
CO2: 21 mmol/L — ABNORMAL LOW (ref 22–32)
Calcium: 7.7 mg/dL — ABNORMAL LOW (ref 8.9–10.3)
Chloride: 102 mmol/L (ref 98–111)
Creatinine: 0.77 mg/dL (ref 0.44–1.00)
GFR, Est AFR Am: >60 mL/min (ref >60)
GFR, Estimated: >60 mL/min (ref >60)
Glucose, Bld: 126 mg/dL — ABNORMAL HIGH (ref 70–99)
Potassium: 4.1 mmol/L (ref 3.5–5.1)
Sodium: 131 mmol/L — ABNORMAL LOW (ref 135–145)
Total Bilirubin: 0.3 mg/dL (ref 0.3–1.2)
Total Protein: 6.7 g/dL (ref 6.5–8.1)

## 2019-05-01 LAB — CBC WITH DIFFERENTIAL (CANCER CENTER ONLY)
Abs Immature Granulocytes: 0.07 10*3/uL (ref 0.00–0.07)
Basophils Absolute: 0 10*3/uL (ref 0.0–0.1)
Basophils Relative: 1 %
Eosinophils Absolute: 0 10*3/uL (ref 0.0–0.5)
Eosinophils Relative: 1 %
HCT: 30.5 % — ABNORMAL LOW (ref 36.0–46.0)
Hemoglobin: 10.1 g/dL — ABNORMAL LOW (ref 12.0–15.0)
Immature Granulocytes: 4 %
Lymphocytes Relative: 41 %
Lymphs Abs: 0.8 10*3/uL (ref 0.7–4.0)
MCH: 28.3 pg (ref 26.0–34.0)
MCHC: 33.1 g/dL (ref 30.0–36.0)
MCV: 85.4 fL (ref 80.0–100.0)
Monocytes Absolute: 0.7 10*3/uL (ref 0.1–1.0)
Monocytes Relative: 36 %
Neutro Abs: 0.4 10*3/uL — CL (ref 1.7–7.7)
Neutrophils Relative %: 17 %
Platelet Count: 292 10*3/uL (ref 150–400)
RBC: 3.57 MIL/uL — ABNORMAL LOW (ref 3.87–5.11)
RDW: 13.6 % (ref 11.5–15.5)
WBC Count: 2.1 10*3/uL — ABNORMAL LOW (ref 4.0–10.5)
nRBC: 0 % (ref 0.0–0.2)

## 2019-05-01 MED ORDER — SODIUM CHLORIDE 0.9 % IV SOLN
Freq: Once | INTRAVENOUS | Status: AC
  Filled 2019-05-01: qty 250

## 2019-05-01 NOTE — Progress Notes (Unsigned)
Provided verbal and printed education on neutropenic precautions to patient and son-in law Merry Proud. They verbalized understanding.

## 2019-05-01 NOTE — Patient Instructions (Signed)
Neutropenia Neutropenia is a condition that occurs when you have a lower-than-normal level of a type of white blood cell (neutrophil) in your body. Neutrophils are made in the spongy center of large bones (bone marrow), and they fight infections. Neutrophils are your body's main defense against bacterial and fungal infections. The fewer neutrophils you have and the longer your body remains without them, the greater your risk of getting a severe infection. What are the causes? This condition can occur if your body uses up or destroys neutrophils faster than your bone marrow can make them. Neutropenia may be caused by:  A bacterial or fungal infection.  Allergic disorders.  Reactions to some medicines.  An autoimmune disease.  An enlarged spleen. This condition can also occur if your bone marrow does not produce enough neutrophils. This problem may be caused by:  Cancer.  Cancer treatments, such as radiation or chemotherapy.  Viral infections.  Medicines, such as phenytoin.  Vitamin B12 deficiency.  Diseases of the bone marrow.  Environmental toxins, such as insecticides. What are the signs or symptoms? This condition does not usually cause symptoms. If symptoms are present, they are usually caused by an underlying infection. Symptoms of an infection may include:  Fever.  Chills.  Swollen glands.  Oral or anal ulcers.  Cough and shortness of breath.  Rash.  Skin infection.  Fatigue. How is this diagnosed? Your health care provider may suspect neutropenia if you have:  A condition that may cause neutropenia.  Symptoms during or after treatment for cancer.  Symptoms of infection, especially fever.  Frequent and unusual infections. This condition is diagnosed based on your medical history and a physical exam. Tests will also be done, such as:  A complete blood count (CBC).  A procedure to collect a sample of bone marrow for examination (bone marrow  biopsy).  A chest X-ray.  A urine culture.  A blood culture. How is this treated? Treatment depends on the underlying cause and severity of your condition. Mild neutropenia may not require treatment. Treatment may include medicines, such as:  Antibiotic medicine given through an IV.  Antiviral medicines.  Antifungal medicines.  A medicine to increase neutrophil production (colony-stimulating factor). You may get this drug through an IV or by injection.  Steroids given through an IV. If an underlying condition is causing neutropenia, you may need treatment for that condition. If medicines or cancer treatments are causing neutropenia, your health care provider may have you stop the medicines or treatment. Follow these instructions at home: Medicines   Take over-the-counter and prescription medicines only as told by your health care provider.  Get a seasonal flu shot (influenza vaccine).  Avoid people who received a vaccine in the past 30 days if that vaccine contained a live version of the germ (live vaccine). You should not get a live vaccine. Common live vaccines are polio, MMR, chicken pox, and shingles vaccines. Eating and drinking  Do not share food utensils.  Do not eat unpasteurized foods.  Do not eat raw or undercooked meat, eggs, or seafood.  Do not eat unwashed, raw fruits or vegetables. Lifestyle  Avoid exposure to groups of people or children.  Avoid being around people who are sick.  Avoid being around dirt or dust, such as in construction areas or gardens.  Do not provide direct care for pets. Avoid animal droppings. Do not clean litter boxes and bird cages.  Do not have sex unless your health care provider has approved. Hygiene  Bathe daily.  Clean the area between the genitals and the anus (perineal area) after you urinate or have a bowel movement. If you are female, wipe from front to back.  Brush your teeth with a soft toothbrush before and  after meals.  Do not use a regular razor. Use an electric razor to remove hair.  Wash your hands often. Make sure others who come in contact with you also wash their hands. If soap and water are not available, use hand sanitizer. General instructions  Follow any precautions as told by your health care provider to reduce your risk for injury or infection.  Take actions to avoid cuts and burns. For example: ? Be cautious when you use knives. Always cut away from yourself. ? Keep knives in protective sheaths or guards when not in use. ? Use oven mitts when you cook with a hot stove, oven, or grill. ? Stand a safe distance away from open fires.  Do not use tampons, enemas, or rectal suppositories unless your health care provider has approved.  Keep all follow-up visits as told by your health care provider. This is important. Contact a health care provider if:  You have: ? A sore throat. ? A warm, red, or tender area on your skin. ? A cough. ? Frequent or painful urination. ? Vaginal discharge or itching.  You develop: ? Sores in your mouth or anus. ? Swollen lymph nodes. ? Red streaks on the skin. ? A rash. Get help right away if:  You have: ? A fever. ? Chills, or you start to shake.  You feel: ? Nauseous, or you vomit. ? Very fatigued. ? Short of breath. Summary  Neutropenia is a condition that occurs when you have a lower-than-normal level of a type of white blood cell (neutrophil) in your body.  This condition can occur if your body uses up or destroys neutrophils faster than your bone marrow can make them.  Treatment depends on the underlying cause and severity of your condition. Mild neutropenia may not require treatment.  Follow any precautions as told by your health care provider to reduce your risk for injury or infection. This information is not intended to replace advice given to you by your health care provider. Make sure you discuss any questions you have  with your health care provider. Document Revised: 11/09/2017 Document Reviewed: 11/09/2017 Elsevier Patient Education  [place].  Rehydration, Adult Rehydration is the replacement of body fluids and salts and minerals (electrolytes) that are lost during dehydration. Dehydration is when there is not enough fluid or water in the body. This happens when you lose more fluids than you take in. Common causes of dehydration include:  Vomiting.  Diarrhea.  Excessive sweating, such as from heat exposure or exercise.  Taking medicines that cause the body to lose excess fluid (diuretics).  Impaired kidney function.  Not drinking enough fluid.  Certain illnesses or infections.  Certain poorly controlled long-term (chronic) illnesses, such as diabetes, heart disease, and kidney disease.  Symptoms of mild dehydration may include thirst, dry lips and mouth, dry skin, and dizziness. Symptoms of severe dehydration may include increased heart rate, confusion, fainting, and not urinating. You can rehydrate by drinking certain fluids or getting fluids through an IV tube, as told by your health care provider. What are the risks? Generally, rehydration is safe. However, one problem that can happen is taking in too much fluid (overhydration). This is rare. If overhydration happens, it can cause an electrolyte  imbalance, kidney failure, or a decrease in salt (sodium) levels in the body. How to rehydrate Follow instructions from your health care provider for rehydration. The kind of fluid you should drink and the amount you should drink depend on your condition.  If directed by your health care provider, drink an oral rehydration solution (ORS). This is a drink designed to treat dehydration that is found in pharmacies and retail stores. ? Make an ORS by following instructions on the package. ? Start by drinking small amounts, about  cup (120 mL) every 5-10 minutes. ? Slowly increase how much you  drink until you have taken the amount recommended by your health care provider.  Drink enough clear fluids to keep your urine clear or pale yellow. If you were instructed to drink an ORS, finish the ORS first, then start slowly drinking other clear fluids. Drink fluids such as: ? Water. Do not drink only water. Doing that can lead to having too little sodium in your body (hyponatremia). ? Ice chips. ? Fruit juice that you have added water to (diluted juice). ? Low-calorie sports drinks.  If you are severely dehydrated, your health care provider may recommend that you receive fluids through an IV tube in the hospital.  Do not take sodium tablets. Doing that can lead to the condition of having too much sodium in your body (hypernatremia). Eating while you rehydrate Follow instructions from your health care provider about what to eat while you rehydrate. Your health care provider may recommend that you slowly begin eating regular foods in small amounts.  Eat foods that contain a healthy balance of electrolytes, such as bananas, oranges, potatoes, tomatoes, and spinach.  Avoid foods that are greasy or contain a lot of fat or sugar.  In some cases, you may get nutrition through a feeding tube that is passed through your nose and into your stomach (nasogastric tube, or NG tube). This may be done if you have uncontrolled vomiting or diarrhea. Beverages to avoid Certain beverages may make dehydration worse. While you rehydrate, avoid:  Alcohol.  Caffeine.  Drinks that contain a lot of sugar. These include: ? High-calorie sports drinks. ? Fruit juice that is not diluted. ? Soda.  Check nutrition labels to see how much sugar or caffeine a beverage contains. Signs of dehydration recovery You may be recovering from dehydration if:  You are urinating more often than before you started rehydrating.  Your urine is clear or pale yellow.  Your energy level improves.  You vomit less  frequently.  You have diarrhea less frequently.  Your appetite improves or returns to normal.  You feel less dizzy or less light-headed.  Your skin tone and color start to look more normal. Contact a health care provider if:  You continue to have symptoms of mild dehydration, such as: ? Thirst. ? Dry lips. ? Slightly dry mouth. ? Dry, warm skin. ? Dizziness.  You continue to vomit or have diarrhea. Get help right away if:  You have symptoms of dehydration that get worse.  You feel: ? Confused. ? Weak. ? Like you are going to faint.  You have not urinated in 6-8 hours.  You have very dark urine.  You have trouble breathing.  Your heart rate while sitting still is over 100 beats a minute.  You cannot drink fluids without vomiting.  You have vomiting or diarrhea that: ? Gets worse. ? Does not go away.  You have a fever. This information is not intended to  replace advice given to you by your health care provider. Make sure you discuss any questions you have with your health care provider. Document Revised: 01/06/2017 Document Reviewed: 03/20/2015 Elsevier Patient Education  2020 [employer].

## 2019-05-01 NOTE — Telephone Encounter (Signed)
Whitney Floyd called back and confirmed the lab appointment for today.

## 2019-05-01 NOTE — Telephone Encounter (Signed)
Left a message regarding lab appointment today at 2 pm.  Requested a return call.

## 2019-05-01 NOTE — Telephone Encounter (Signed)
CRITICAL VALUE STICKER  CRITICAL VALUE: ANC 0.4  RECEIVER (on-site recipient of call):Whitney Floyd NOTIFIED: 05/01/19  1420  MESSENGER (representative from lab): Billee Cashing  MD NOTIFIED: Sandi Mealy PA/ Dr. Alvy Bimler  TIME OF NOTIFICATION: 1439  RESPONSE: Dr. Alvy Bimler notified expected [name] to be low, Pt just finished treatment. Neutropenic precautions discussed with Son Per RN in infusion. [name])

## 2019-05-01 NOTE — Progress Notes (Signed)
The patient was seen in the infusion room today.  I was contacted by the nurse who was caring for her.  It was reported that the patient was up to the bathroom when she reported having bright red blood per rectum.  The patient reports that she has been having bright red stools for 3 to 4 days but thought that that was normal.  She has taken Imodium on several days this week.  Her last labs from 04/18/2019 returned showing a hemoglobin of 11.4 and a hematocrit of 35.3.  She is scheduled to see Dr. Alvy Bimler on Thursday.  She was given stool cards to complete and return.  She expresses understanding and agreement with this plan.  It was reviewed with the patient as to how to collect a stool specimen.  Sandi Mealy, MHS, PA-C  Physician Assistant

## 2019-05-01 NOTE — Progress Notes (Signed)
85 year old female diagnosed with ovarian cancer.  She is a patient of Dr. Alvy Bimler.  She is receiving carboplatin and Taxol.  Past medical history includes PUD, atrial fibrillation, hyperlipidemia, GERD, diverticulosis, and anemia.  Medications include Decadron, Prilosec, Zofran, and Compazine.  Labs include glucose 105 and albumin 2.8.  Height: 5 feet 2 inches. Weight: 126.1 pounds on March 22. Usual body weight: 135 pounds. BMI: 23.06.  Patient reports that she has not been able to eat as much as she would like.  She is receiving additional fluids. She reports she is very fatigued and has been having frequent loose stools. She reports a dislike of overly sweet foods. Reports tolerating 2% milk, [place] bacon, cantaloupe, milkshakes, pizza, and eggs.  Nutrition diagnosis: Unintended weight loss related to ovarian cancer as evidenced by 7% weight loss from usual body weight.  Intervention: Educated patient on strategies for improving taste. Reviewed importance of small frequent meals and snacks with high-calorie, high-protein foods. Educated on how to modify foods to be less sweet.  Provided examples. Provided fact sheets.  Questions were answered.  Teach back method used.  Contact information given.  Monitoring, evaluation, goals: Patient will tolerate increased calories and protein to minimize weight loss.  Next visit: Monday, April 5 during infusion.  **Disclaimer: This note was dictated with voice recognition software. Similar sounding words can inadvertently be transcribed and this note may contain transcription errors which may not have been corrected upon publication of note.**

## 2019-05-02 ENCOUNTER — Inpatient Hospital Stay: Payer: Medicare HMO | Admitting: Hematology and Oncology

## 2019-05-02 ENCOUNTER — Other Ambulatory Visit: Payer: Self-pay

## 2019-05-02 ENCOUNTER — Inpatient Hospital Stay: Payer: Medicare HMO

## 2019-05-02 ENCOUNTER — Encounter: Payer: Self-pay | Admitting: Hematology and Oncology

## 2019-05-02 DIAGNOSIS — E860 Dehydration: Secondary | ICD-10-CM

## 2019-05-02 DIAGNOSIS — Z5111 Encounter for antineoplastic chemotherapy: Secondary | ICD-10-CM | POA: Diagnosis not present

## 2019-05-02 DIAGNOSIS — R0602 Shortness of breath: Secondary | ICD-10-CM | POA: Diagnosis not present

## 2019-05-02 DIAGNOSIS — C562 Malignant neoplasm of left ovary: Secondary | ICD-10-CM

## 2019-05-02 DIAGNOSIS — C800 Disseminated malignant neoplasm, unspecified: Secondary | ICD-10-CM

## 2019-05-02 DIAGNOSIS — R64 Cachexia: Secondary | ICD-10-CM | POA: Diagnosis not present

## 2019-05-02 DIAGNOSIS — K5900 Constipation, unspecified: Secondary | ICD-10-CM | POA: Diagnosis not present

## 2019-05-02 DIAGNOSIS — D61818 Other pancytopenia: Secondary | ICD-10-CM | POA: Diagnosis not present

## 2019-05-02 DIAGNOSIS — G893 Neoplasm related pain (acute) (chronic): Secondary | ICD-10-CM | POA: Diagnosis not present

## 2019-05-02 DIAGNOSIS — I480 Paroxysmal atrial fibrillation: Secondary | ICD-10-CM | POA: Diagnosis not present

## 2019-05-02 DIAGNOSIS — C786 Secondary malignant neoplasm of retroperitoneum and peritoneum: Secondary | ICD-10-CM | POA: Diagnosis not present

## 2019-05-02 MED ORDER — SODIUM CHLORIDE 0.9 % IV SOLN
Freq: Once | INTRAVENOUS | Status: AC
  Filled 2019-05-02: qty 250

## 2019-05-02 NOTE — Assessment & Plan Note (Signed)
She is eating better Dietitian visit was helpful I recommend she continue frequent small meals

## 2019-05-02 NOTE — Progress Notes (Signed)
[facility] OFFICE PROGRESS NOTE  Patient Care Team: Ernestene Kiel, MD as PCP - General (Internal Medicine) Berniece Salines, DO as PCP - Cardiology (Cardiology) Awanda Mink Craige Cotta, RN as Oncology Nurse Navigator (Oncology)  ASSESSMENT & PLAN:  Ovarian cancer, left [facility]) She is feeling so much better today We will proceed with IV fluids today but she would like to cancel her treatment tomorrow and Saturday I will call her next week on Monday to check on her I suspect she is turning the corner Her abdominal distention and pain has improved She is eating better We will resume chemotherapy as scheduled next month  Carcinomatosis ([place]) Her symptoms have improved She tolerated oral diet well without significant nausea Clinically, her dehydration has improved We will observe carefully and continue supportive care  Pancytopenia, acquired [facility]) This is not unexpected side effects of treatment We discussed neutropenic precaution  Malignant cachexia [facility]) She is eating better Dietitian visit was helpful I recommend she continue frequent small meals   No orders of the defined types were placed in this encounter.   All questions were answered. The patient knows to call the clinic with any problems, questions or concerns. The total time spent in the appointment was 20 minutes encounter with patients including review of chart and various tests results, discussions about plan of care and coordination of care plan   Heath Lark, MD 05/02/2019 1:52 PM  INTERVAL HISTORY: Please see below for problem oriented charting. She is feeling better today She had a good visit with the dietitian and was able to tolerate frequent small meals. Today, she ate butter toast, egg, protein drink, chicken leg, chopped fruit and chicken noodle soup She denies nausea She continues to have frequent small bowel movement but it does not bother her Her diarrhea fluid appears less red She sleeps  well Overall, she felt a bit better SUMMARY OF ONCOLOGIC HISTORY: Oncology History  Carcinomatosis ([place])  04/03/2019 Initial Diagnosis   Carcinomatosis ([place])   Ovarian cancer, left ([place])  02/07/2019 Imaging   Outside CT chest showed no evidence of metastatic disease   03/20/2019 Imaging   Outside Ct abdomen and pelvis showed diffuse carcinomatosis, left adnexa mass and ascites   04/11/2019 Pathology Results   FINAL MICROSCOPIC DIAGNOSIS:   A. OMENTUM, LEFT ABDOMINAL, NEEDLE CORE BIOPSY:  - Metastatic carcinoma.  See comment    COMMENT:   Immunohistochemical stains show that the tumor cells are positive for PAX8, ER, WT1, CK7 (focal) and CK 5/6 (patchy); and negative for p63, calretinin, D2-40, CK20, GATA3 and CDX2.  This mmunohistochemical profile is consistent with a gynecologic primary and suggestive of a low-grade serous carcinoma.   04/11/2019 Procedure   Image guided core biopsy of the omental disease. In addition, small amount of ascites was collected for cytology   04/18/2019 Cancer Staging   Staging form: Ovary, Fallopian Tube, and Primary Peritoneal Carcinoma, AJCC 8th Edition - Clinical stage from 04/18/2019: FIGO Stage IIIC (cT3c, cN0, cM0) - Signed by Heath Lark, MD on 04/18/2019   04/18/2019 Tumor Marker   Patient's tumor was tested for the following markers: CA-125 Results of the tumor marker test revealed 402   04/22/2019 -  Chemotherapy   The patient had carboplatin and taxol for chemotherapy treatment.       REVIEW OF SYSTEMS:   Constitutional: Denies fevers, chills or abnormal weight loss Eyes: Denies blurriness of vision Ears, nose, mouth, throat, and face: Denies mucositis or sore throat Respiratory: Denies cough, dyspnea or  wheezes Cardiovascular: Denies palpitation, chest discomfort or lower extremity swelling Skin: Denies abnormal skin rashes Lymphatics: Denies new lymphadenopathy or easy bruising Neurological:Denies numbness, tingling or new  weaknesses Behavioral/Psych: Mood is stable, no new changes  All other systems were reviewed with the patient and are negative.  I have reviewed the past medical history, past surgical history, social history and family history with the patient and they are unchanged from previous note.  ALLERGIES:  is allergic to codeine sulfate [codeine]; tetanus toxoids; horse-derived products; and sulfa antibiotics.  MEDICATIONS:  Current Outpatient Medications  Medication Sig Dispense Refill  . dexamethasone (DECADRON) 4 MG tablet Take 2 tabs at the night before and 2 tab the morning of chemotherapy, every 3 weeks, by mouth 24 tablet 6  . diltiazem (CARDIZEM) 120 MG tablet Take 120 mg by mouth daily.    Marland Kitchen ELIQUIS 5 MG TABS tablet Take 5 mg by mouth 2 (two) times daily.    . metoprolol succinate (TOPROL XL) 25 MG 24 hr tablet Take 1.5 tablets (37.5 mg total) by mouth daily. 135 tablet 1  . omeprazole (PRILOSEC) 20 MG capsule Take 20 mg by mouth 2 (two) times daily before a meal.     . ondansetron (ZOFRAN) 8 MG tablet Take 1 tablet (8 mg total) by mouth every 8 (eight) hours as needed for refractory nausea / vomiting. 30 tablet 1  . oxyCODONE (OXY IR/ROXICODONE) 5 MG immediate release tablet Take 1 tablet (5 mg total) by mouth every 4 (four) hours as needed for severe pain. 30 tablet 0  . prochlorperazine (COMPAZINE) 10 MG tablet Take 1 tablet (10 mg total) by mouth every 6 (six) hours as needed (Nausea or vomiting). 30 tablet 1  . triamcinolone cream (KENALOG) 0.5 % Apply 1 application topically 3 (three) times daily. 90 g 1   No current facility-administered medications for this visit.    PHYSICAL EXAMINATION: ECOG PERFORMANCE STATUS: 1 - Symptomatic but completely ambulatory  Vitals:   05/02/19 1343  BP: 115/60  Pulse: 93  Resp: 18  Temp: 98.7 F (37.1 C)  SpO2: 100%   Filed Weights   05/02/19 1343  Weight: 129 lb 9.6 oz (58.8 kg)    GENERAL:alert, no distress and comfortable SKIN:  skin color, texture, turgor are normal, no rashes or significant lesions EYES: normal, Conjunctiva are pink and non-injected, sclera clear OROPHARYNX:no exudate, no erythema and lips, buccal mucosa, and tongue normal  NECK: supple, thyroid normal size, non-tender, without nodularity LYMPH:  no palpable lymphadenopathy in the cervical, axillary or inguinal LUNGS: clear to auscultation and percussion with normal breathing effort HEART: regular rate & rhythm and no murmurs and no lower extremity edema ABDOMEN:abdomen soft, mildly distended Musculoskeletal:no cyanosis of digits and no clubbing  NEURO: alert & oriented x 3 with fluent speech, no focal motor/sensory deficits  LABORATORY DATA:  I have reviewed the data as listed    Component Value Date/Time   NA 131 (L) 05/01/2019 1405   NA 137 03/04/2019 1107   K 4.1 05/01/2019 1405   CL 102 05/01/2019 1405   CO2 21 (L) 05/01/2019 1405   GLUCOSE 126 (H) 05/01/2019 1405   BUN 14 05/01/2019 1405   BUN 11 03/04/2019 1107   CREATININE 0.77 05/01/2019 1405   CALCIUM 7.7 (L) 05/01/2019 1405   PROT 6.7 05/01/2019 1405   PROT 7.4 03/04/2019 1107   ALBUMIN 2.4 (L) 05/01/2019 1405   ALBUMIN 3.6 03/04/2019 1107   AST 127 (H) 05/01/2019 1405   ALT  72 (H) 05/01/2019 1405   ALKPHOS 76 05/01/2019 1405   BILITOT 0.3 05/01/2019 1405   GFRNONAA >60 05/01/2019 1405   GFRAA >60 05/01/2019 1405    No results found for: SPEP, UPEP  Lab Results  Component Value Date   WBC 2.1 (L) 05/01/2019   NEUTROABS 0.4 (LL) 05/01/2019   HGB 10.1 (L) 05/01/2019   HCT 30.5 (L) 05/01/2019   MCV 85.4 05/01/2019   PLT 292 05/01/2019      Chemistry      Component Value Date/Time   NA 131 (L) 05/01/2019 1405   NA 137 03/04/2019 1107   K 4.1 05/01/2019 1405   CL 102 05/01/2019 1405   CO2 21 (L) 05/01/2019 1405   BUN 14 05/01/2019 1405   BUN 11 03/04/2019 1107   CREATININE 0.77 05/01/2019 1405      Component Value Date/Time   CALCIUM 7.7 (L) 05/01/2019  1405   ALKPHOS 76 05/01/2019 1405   AST 127 (H) 05/01/2019 1405   ALT 72 (H) 05/01/2019 1405   BILITOT 0.3 05/01/2019 1405       RADIOGRAPHIC STUDIES: I have personally reviewed the radiological images as listed and agreed with the findings in the report. CT BIOPSY  Result Date: 04/11/2019 INDICATION: 85 year old with evidence of diffuse omental and peritoneal disease. Tissue diagnosis is needed. EXAM: IMAGE GUIDED OMENTAL BIOPSY AND FLUID ASPIRATION MEDICATIONS: None. ANESTHESIA/SEDATION: Moderate (conscious) sedation was employed during this procedure. A total of Versed 2.0 mg and Fentanyl 100 mcg was administered intravenously. Moderate Sedation Time: 21 minutes. The patient's level of consciousness and vital signs were monitored continuously by radiology nursing throughout the procedure under my direct supervision. FLUOROSCOPY TIME:  Fluoroscopy Time: None COMPLICATIONS: None immediate. PROCEDURE: Informed written consent was obtained from the patient after a thorough discussion of the procedural risks, benefits and alternatives. All questions were addressed. A timeout was performed prior to the initiation of the procedure. Patient was placed supine. CT images through the abdomen were obtained. Omental disease in the left anterior abdomen was identified and targeted. This area was also evaluated with ultrasound. Skin was prepped with chlorhexidine and sterile field was created. Skin was anesthetized with 1% lidocaine. Small incision was made. 17 gauge coaxial needle was directed into the omental thickening using ultrasound guidance. Needle position was confirmed with CT. Total of 4 core biopsies were obtained under ultrasound guidance. Specimens placed in saline. In addition, approximately 30 mL of red serous ascites was aspirated from the 17 gauge needle prior to removal. Bandage placed over the puncture site. FINDINGS: Extensive omental thickening in the left anterior abdomen. Needle position  confirmed within the omental disease with CT and ultrasound. Four adequate core biopsies were obtained. Small amount of ascites was also collected for cytology. IMPRESSION: Image guided core biopsy of the omental disease. In addition, small amount of ascites was collected for cytology. Electronically Signed   By: Markus Daft M.D.   On: 04/11/2019 10:30

## 2019-05-02 NOTE — Patient Instructions (Signed)
Dehydration, Adult Dehydration is condition in which there is not enough water or other fluids in the body. This happens when a person loses more fluids than he or she takes in. Important body parts cannot work right without the right amount of fluids. Any loss of fluids from the body can cause dehydration. Dehydration can be mild, worse, or very bad. It should be treated right away to keep it from getting very bad. What are the causes? This condition may be caused by:  Conditions that cause loss of water or other fluids, such as: ? Watery poop (diarrhea). ? Vomiting. ? Sweating a lot. ? Peeing (urinating) a lot.  Not drinking enough fluids, especially when you: ? Are ill. ? Are doing things that take a lot of energy to do.  Other illnesses and conditions, such as fever or infection.  Certain medicines, such as medicines that take extra fluid out of the body (diuretics).  Lack of safe drinking water.  Not being able to get enough water and food. What increases the risk? The following factors may make you more likely to develop this condition:  Having a long-term (chronic) illness that has not been treated the right way, such as: ? Diabetes. ? Heart disease. ? Kidney disease.  Being 65 years of age or older.  Having a disability.  Living in a place that is high above the ground or sea (high in altitude). The thinner, dried air causes more fluid loss.  Doing exercises that put stress on your body for a long time. What are the signs or symptoms? Symptoms of dehydration depend on how bad it is. Mild or worse dehydration  Thirst.  Dry lips or dry mouth.  Feeling dizzy or light-headed, especially when you stand up from sitting.  Muscle cramps.  Your body making: ? Dark pee (urine). Pee may be the color of tea. ? Less pee than normal. ? Less tears than normal.  Headache. Very bad dehydration  Changes in skin. Skin may: ? Be cold to the touch (clammy). ? Be blotchy  or pale. ? Not go back to normal right after you lightly pinch it and let it go.  Little or no tears, pee, or sweat.  Changes in vital signs, such as: ? Fast breathing. ? Low blood pressure. ? Weak pulse. ? Pulse that is more than 100 beats a minute when you are sitting still.  Other changes, such as: ? Feeling very thirsty. ? Eyes that look hollow (sunken). ? Cold hands and feet. ? Being mixed up (confused). ? Being very tired (lethargic) or having trouble waking from sleep. ? Short-term weight loss. ? Loss of consciousness. How is this treated? Treatment for this condition depends on how bad it is. Treatment should start right away. Do not wait until your condition gets very bad. Very bad dehydration is an emergency. You will need to go to a hospital.  Mild or worse dehydration can be treated at home. You may be asked to: ? Drink more fluids. ? Drink an oral rehydration solution (ORS). This drink helps get the right amounts of fluids and salts and minerals in the blood (electrolytes).  Very bad dehydration can be treated: ? With fluids through an IV tube. ? By getting normal levels of salts and minerals in your blood. This is often done by giving salts and minerals through a tube. The tube is passed through your nose and into your stomach. ? By treating the root cause. Follow these instructions at   home: Oral rehydration solution If told by your doctor, drink an ORS:  Make an ORS. Use instructions on the package.  Start by drinking small amounts, about  cup (120 mL) every 5-10 minutes.  Slowly drink more until you have had the amount that your doctor said to have. Eating and drinking         Drink enough clear fluid to keep your pee pale yellow. If you were told to drink an ORS, finish the ORS first. Then, start slowly drinking other clear fluids. Drink fluids such as: ? Water. Do not drink only water. Doing that can make the salt (sodium) level in your body get too  low. ? Water from ice chips you suck on. ? Fruit juice that you have added water to (diluted). ? Low-calorie sports drinks.  Eat foods that have the right amounts of salts and minerals, such as: ? Bananas. ? Oranges. ? Potatoes. ? Tomatoes. ? Spinach.  Do not drink alcohol.  Avoid: ? Drinks that have a lot of sugar. These include:  High-calorie sports drinks.  Fruit juice that you did not add water to.  Soda.  Caffeine. ? Foods that are greasy or have a lot of fat or sugar. General instructions  Take over-the-counter and prescription medicines only as told by your doctor.  Do not take salt tablets. Doing that can make the salt level in your body get too high.  Return to your normal activities as told by your doctor. Ask your doctor what activities are safe for you.  Keep all follow-up visits as told by your doctor. This is important. Contact a doctor if:  You have pain in your belly (abdomen) and the pain: ? Gets worse. ? Stays in one place.  You have a rash.  You have a stiff neck.  You get angry or annoyed (irritable) more easily than normal.  You are more tired or have a harder time waking than normal.  You feel: ? Weak or dizzy. ? Very thirsty. Get help right away if you have:  Any symptoms of very bad dehydration.  Symptoms of vomiting, such as: ? You cannot eat or drink without vomiting. ? Your vomiting gets worse or does not go away. ? Your vomit has blood or green stuff in it.  Symptoms that get worse with treatment.  A fever.  A very bad headache.  Problems with peeing or pooping (having a bowel movement), such as: ? Watery poop that gets worse or does not go away. ? Blood in your poop (stool). This may cause poop to look black and tarry. ? Not peeing in 6-8 hours. ? Peeing only a small amount of very dark pee in 6-8 hours.  Trouble breathing. These symptoms may be an emergency. Do not wait to see if the symptoms will go away. Get  medical help right away. Call your local emergency services (911 in the [place] Do not drive yourself to the hospital. Summary  Dehydration is a condition in which there is not enough water or other fluids in the body. This happens when a person loses more fluids than he or she takes in.  Treatment for this condition depends on how bad it is. Treatment should be started right away. Do not wait until your condition gets very bad.  Drink enough clear fluid to keep your pee pale yellow. If you were told to drink an oral rehydration solution (ORS), finish the ORS first. Then, start slowly drinking other clear fluids.  Take over-the-counter and prescription medicines only as told by your doctor.  Get help right away if you have any symptoms of very bad dehydration. This information is not intended to replace advice given to you by your health care provider. Make sure you discuss any questions you have with your health care provider. Document Revised: 09/06/2018 Document Reviewed: 09/06/2018 Elsevier Patient Education  2020 [place] (COVID-19) Are you at risk?  Are you at risk for the Coronavirus (COVID-19)?  To be considered HIGH RISK for Coronavirus (COVID-19), you have to meet the following criteria:  . Traveled to [place], [place], [place], [place] or [place]; or in the [place] to [employer], [place], [place], or [place]; and have fever, cough, and shortness of breath within the last 2 weeks of travel OR . Been in close contact with a person diagnosed with COVID-19 within the last 2 weeks and have fever, cough, and shortness of breath . IF YOU DO NOT MEET THESE CRITERIA, YOU ARE CONSIDERED LOW RISK FOR COVID-19.  What to do if you are HIGH RISK for COVID-19?  Marland Kitchen If you are having a medical emergency, call 911. . Seek medical care right away. Before you go to a doctor's office, urgent care or emergency department, call ahead and tell them about your recent travel,  contact with someone diagnosed with COVID-19, and your symptoms. You should receive instructions from your physician's office regarding next steps of care.  . When you arrive at healthcare provider, tell the healthcare staff immediately you have returned from visiting [place], [place], [place], [place] or [place]; or traveled in the [place] to [place], [place], [place], or [place]; in the last two weeks or you have been in close contact with a person diagnosed with COVID-19 in the last 2 weeks.   . Tell the health care staff about your symptoms: fever, cough and shortness of breath. . After you have been seen by a medical provider, you will be either: o Tested for (COVID-19) and discharged home on quarantine except to seek medical care if symptoms worsen, and asked to  - Stay home and avoid contact with others until you get your results (4-5 days)  - Avoid travel on public transportation if possible (such as bus, train, or airplane) or o Sent to the Emergency Department by EMS for evaluation, COVID-19 testing, and possible admission depending on your condition and test results.  What to do if you are LOW RISK for COVID-19?  Reduce your risk of any infection by using the same precautions used for avoiding the common cold or flu:  Marland Kitchen Wash your hands often with soap and warm water for at least 20 seconds.  If soap and water are not readily available, use an alcohol-based hand sanitizer with at least 60% alcohol.  . If coughing or sneezing, cover your mouth and nose by coughing or sneezing into the elbow areas of your shirt or coat, into a tissue or into your sleeve (not your hands). . Avoid shaking hands with others and consider head nods or verbal greetings only. . Avoid touching your eyes, nose, or mouth with unwashed hands.  . Avoid close contact with people who are sick. . Avoid places or events with large numbers of people in one location, like concerts or sporting events. . Carefully  consider travel plans you have or are making. . If you are planning any travel outside or inside the [place], visit the CDC's Travelers' Health  webpage for the latest health notices. . If you have some symptoms but not all symptoms, continue to monitor at home and seek medical attention if your symptoms worsen. . If you are having a medical emergency, call 911.   [place] / e-Visit: [email or web address]         [facility]: [facility]: [phone]                   [facility]: [phone]

## 2019-05-02 NOTE — Assessment & Plan Note (Signed)
Her symptoms have improved She tolerated oral diet well without significant nausea Clinically, her dehydration has improved We will observe carefully and continue supportive care

## 2019-05-02 NOTE — Assessment & Plan Note (Signed)
This is not unexpected side effects of treatment We discussed neutropenic precaution

## 2019-05-02 NOTE — Assessment & Plan Note (Signed)
She is feeling so much better today We will proceed with IV fluids today but she would like to cancel her treatment tomorrow and Saturday I will call her next week on Monday to check on her I suspect she is turning the corner Her abdominal distention and pain has improved She is eating better We will resume chemotherapy as scheduled next month

## 2019-05-03 ENCOUNTER — Ambulatory Visit: Payer: Medicare HMO | Admitting: Cardiology

## 2019-05-03 ENCOUNTER — Inpatient Hospital Stay: Payer: Medicare HMO

## 2019-05-03 MED ORDER — DILTIAZEM HCL 120 MG PO TABS: 120.0000 mg | ORAL_TABLET | Freq: Every day | ORAL | 3 refills | Status: DC

## 2019-05-04 ENCOUNTER — Ambulatory Visit: Payer: Medicare HMO

## 2019-05-06 ENCOUNTER — Telehealth: Payer: Self-pay

## 2019-05-06 NOTE — Telephone Encounter (Signed)
-----   Message from Heath Lark, MD sent at 05/06/2019  7:52 AM EDT ----- Regarding: can you call and ask how she did over the weekend?

## 2019-05-06 NOTE — Telephone Encounter (Signed)
Called and given below message. She did well over the weekend. Denies diarrhea and she had a normal bm today. Sleeping better. Eating and drinking with no problems. Instructed to call the office if needed. She verbalized understanding.

## 2019-05-07 NOTE — Progress Notes (Signed)
Pharmacist Chemotherapy Monitoring - Follow Up Assessment    I verify that I have reviewed each item in the below checklist:  . Regimen for the patient is scheduled for the appropriate day and plan matches scheduled date. Marland Kitchen Appropriate non-routine labs are ordered dependent on drug ordered. . If applicable, additional medications reviewed and ordered per protocol based on lifetime cumulative doses and/or treatment regimen.   Plan for follow-up and/or issues identified: No . I-vent associated with next due treatment: No . MD and/or nursing notified: No  Issabela Lesko D 05/07/2019 11:51 AM

## 2019-05-13 ENCOUNTER — Encounter: Payer: Self-pay | Admitting: Hematology and Oncology

## 2019-05-13 ENCOUNTER — Inpatient Hospital Stay: Payer: Medicare HMO | Admitting: Hematology and Oncology

## 2019-05-13 ENCOUNTER — Inpatient Hospital Stay: Payer: Medicare HMO | Admitting: Nutrition

## 2019-05-13 ENCOUNTER — Inpatient Hospital Stay: Payer: Medicare HMO

## 2019-05-13 ENCOUNTER — Inpatient Hospital Stay: Payer: Medicare HMO | Attending: Gynecologic Oncology

## 2019-05-13 ENCOUNTER — Other Ambulatory Visit: Payer: Self-pay

## 2019-05-13 DIAGNOSIS — R188 Other ascites: Secondary | ICD-10-CM | POA: Diagnosis not present

## 2019-05-13 DIAGNOSIS — R109 Unspecified abdominal pain: Secondary | ICD-10-CM

## 2019-05-13 DIAGNOSIS — R64 Cachexia: Secondary | ICD-10-CM | POA: Diagnosis not present

## 2019-05-13 DIAGNOSIS — Z7901 Long term (current) use of anticoagulants: Secondary | ICD-10-CM | POA: Insufficient documentation

## 2019-05-13 DIAGNOSIS — R531 Weakness: Secondary | ICD-10-CM | POA: Diagnosis not present

## 2019-05-13 DIAGNOSIS — C786 Secondary malignant neoplasm of retroperitoneum and peritoneum: Secondary | ICD-10-CM | POA: Insufficient documentation

## 2019-05-13 DIAGNOSIS — C562 Malignant neoplasm of left ovary: Secondary | ICD-10-CM | POA: Insufficient documentation

## 2019-05-13 DIAGNOSIS — D61818 Other pancytopenia: Secondary | ICD-10-CM | POA: Diagnosis not present

## 2019-05-13 DIAGNOSIS — Z7189 Other specified counseling: Secondary | ICD-10-CM

## 2019-05-13 DIAGNOSIS — C800 Disseminated malignant neoplasm, unspecified: Secondary | ICD-10-CM

## 2019-05-13 DIAGNOSIS — Z79899 Other long term (current) drug therapy: Secondary | ICD-10-CM | POA: Insufficient documentation

## 2019-05-13 DIAGNOSIS — Z5111 Encounter for antineoplastic chemotherapy: Secondary | ICD-10-CM | POA: Diagnosis not present

## 2019-05-13 DIAGNOSIS — R971 Elevated cancer antigen 125 [CA 125]: Secondary | ICD-10-CM

## 2019-05-13 DIAGNOSIS — Z7952 Long term (current) use of systemic steroids: Secondary | ICD-10-CM | POA: Insufficient documentation

## 2019-05-13 LAB — CMP (CANCER CENTER ONLY)
ALT: 40 U/L (ref 0–44)
AST: 45 U/L — ABNORMAL HIGH (ref 15–41)
Albumin: 3.1 g/dL — ABNORMAL LOW (ref 3.5–5.0)
Alkaline Phosphatase: 67 U/L (ref 38–126)
Anion gap: 15 (ref 5–15)
BUN: 22 mg/dL (ref 8–23)
CO2: 18 mmol/L — ABNORMAL LOW (ref 22–32)
Calcium: 9.4 mg/dL (ref 8.9–10.3)
Chloride: 106 mmol/L (ref 98–111)
Creatinine: 0.91 mg/dL (ref 0.44–1.00)
GFR, Est AFR Am: >60 mL/min (ref >60)
GFR, Estimated: 59 mL/min — ABNORMAL LOW (ref >60)
Glucose, Bld: 192 mg/dL — ABNORMAL HIGH (ref 70–99)
Potassium: 4.4 mmol/L (ref 3.5–5.1)
Sodium: 139 mmol/L (ref 135–145)
Total Bilirubin: <0.2 mg/dL — ABNORMAL LOW (ref 0.3–1.2)
Total Protein: 8.3 g/dL — ABNORMAL HIGH (ref 6.5–8.1)

## 2019-05-13 LAB — CBC WITH DIFFERENTIAL (CANCER CENTER ONLY)
Abs Immature Granulocytes: 0.09 10*3/uL — ABNORMAL HIGH (ref 0.00–0.07)
Basophils Absolute: 0 10*3/uL (ref 0.0–0.1)
Basophils Relative: 0 %
Eosinophils Absolute: 0 10*3/uL (ref 0.0–0.5)
Eosinophils Relative: 0 %
HCT: 35.2 % — ABNORMAL LOW (ref 36.0–46.0)
Hemoglobin: 11.5 g/dL — ABNORMAL LOW (ref 12.0–15.0)
Immature Granulocytes: 1 %
Lymphocytes Relative: 15 %
Lymphs Abs: 1.7 10*3/uL (ref 0.7–4.0)
MCH: 28.8 pg (ref 26.0–34.0)
MCHC: 32.7 g/dL (ref 30.0–36.0)
MCV: 88.2 fL (ref 80.0–100.0)
Monocytes Absolute: 0.3 10*3/uL (ref 0.1–1.0)
Monocytes Relative: 2 %
Neutro Abs: 9.6 10*3/uL — ABNORMAL HIGH (ref 1.7–7.7)
Neutrophils Relative %: 82 %
Platelet Count: 372 10*3/uL (ref 150–400)
RBC: 3.99 MIL/uL (ref 3.87–5.11)
RDW: 15.3 % (ref 11.5–15.5)
WBC Count: 11.7 10*3/uL — ABNORMAL HIGH (ref 4.0–10.5)
nRBC: 0 % (ref 0.0–0.2)

## 2019-05-13 MED ORDER — DIPHENHYDRAMINE HCL 50 MG/ML IJ SOLN
INTRAMUSCULAR | Status: AC
  Filled 2019-05-13: qty 1

## 2019-05-13 MED ORDER — SODIUM CHLORIDE 0.9 % IV SOLN
Freq: Once | INTRAVENOUS | Status: AC
  Filled 2019-05-13: qty 250

## 2019-05-13 MED ORDER — DIPHENHYDRAMINE HCL 50 MG/ML IJ SOLN
25.0000 mg | Freq: Once | INTRAMUSCULAR | Status: AC
  Administered 2019-05-13: 25 mg via INTRAVENOUS

## 2019-05-13 MED ORDER — PALONOSETRON HCL INJECTION 0.25 MG/5ML
0.2500 mg | Freq: Once | INTRAVENOUS | Status: AC
  Administered 2019-05-13: 0.25 mg via INTRAVENOUS

## 2019-05-13 MED ORDER — SODIUM CHLORIDE 0.9 % IV SOLN
10.0000 mg | Freq: Once | INTRAVENOUS | Status: AC
  Administered 2019-05-13: 10 mg via INTRAVENOUS
  Filled 2019-05-13: qty 1

## 2019-05-13 MED ORDER — PALONOSETRON HCL INJECTION 0.25 MG/5ML
INTRAVENOUS | Status: AC
  Filled 2019-05-13: qty 5

## 2019-05-13 MED ORDER — SODIUM CHLORIDE 0.9 % IV SOLN
175.0000 mg/m2 | Freq: Once | INTRAVENOUS | Status: AC
  Administered 2019-05-13: 276 mg via INTRAVENOUS
  Filled 2019-05-13: qty 46

## 2019-05-13 MED ORDER — SODIUM CHLORIDE 0.9 % IV SOLN
150.0000 mg | Freq: Once | INTRAVENOUS | Status: AC
  Administered 2019-05-13: 150 mg via INTRAVENOUS
  Filled 2019-05-13: qty 150

## 2019-05-13 MED ORDER — FAMOTIDINE IN NACL 20-0.9 MG/50ML-% IV SOLN
INTRAVENOUS | Status: AC
  Filled 2019-05-13: qty 50

## 2019-05-13 MED ORDER — SODIUM CHLORIDE 0.9 % IV SOLN
384.0000 mg | Freq: Once | INTRAVENOUS | Status: AC
  Administered 2019-05-13: 380 mg via INTRAVENOUS
  Filled 2019-05-13: qty 38

## 2019-05-13 MED ORDER — FAMOTIDINE IN NACL 20-0.9 MG/50ML-% IV SOLN
20.0000 mg | Freq: Once | INTRAVENOUS | Status: AC
  Administered 2019-05-13: 20 mg via INTRAVENOUS

## 2019-05-13 NOTE — Assessment & Plan Note (Signed)
Despite the weight loss, her appetite is improving I recommend she continues frequent small meals Albumin has improved She will see dietitian today

## 2019-05-13 NOTE — Progress Notes (Signed)
[facility] OFFICE PROGRESS NOTE  Patient Care Team: Ernestene Kiel, MD as PCP - General (Internal Medicine) Berniece Salines, DO as PCP - Cardiology (Cardiology) Awanda Mink Craige Cotta, RN as Oncology Nurse Navigator (Oncology)  ASSESSMENT & PLAN:  Ovarian cancer, left ([place]) Overall, she is doing well The recent weight loss is secondary to loss of fluid retention She have less abdominal distention Her nutrition has improved She is getting stronger I will adjust the dose of her chemotherapy accordingly The plan will be to proceed with minimum 3 cycles of treatment before repeat CT imaging  Generalized weakness Her weakness is improving I encouraged her to increase oral intake as tolerated and to exercise as tolerated  Abdominal pain This is improved She has positive response to therapy She will take pain medicine as needed  Malignant cachexia ([place]) Despite the weight loss, her appetite is improving I recommend she continues frequent small meals Albumin has improved She will see dietitian today  Pancytopenia, acquired ([place]) Her pancytopenia has improved We will observe closely I will adjust the dose of her chemotherapy according to her most current weight to avoid worsening pancytopenia   No orders of the defined types were placed in this encounter.   All questions were answered. The patient knows to call the clinic with any problems, questions or concerns. The total time spent in the appointment was 30 minutes encounter with patients including review of chart and various tests results, discussions about plan of care and coordination of care plan   Heath Lark, MD 05/13/2019 11:58 AM  INTERVAL HISTORY: Please see below for problem oriented charting. She returns with her daughter for further follow-up She is doing well Despite the weight loss, she has a ravenous appetite She denies nausea or changes in bowel habits She have regular bowel movement with  laxatives She has not needed to take much pain medicine recently Her energy level is fair No recent bleeding No peripheral neuropathy from recent treatment  SUMMARY OF ONCOLOGIC HISTORY: Oncology History  Carcinomatosis ([place])  04/03/2019 Initial Diagnosis   Carcinomatosis ([place])   Ovarian cancer, left ([place])  02/07/2019 Imaging   Outside CT chest showed no evidence of metastatic disease   03/20/2019 Imaging   Outside Ct abdomen and pelvis showed diffuse carcinomatosis, left adnexa mass and ascites   04/11/2019 Pathology Results   FINAL MICROSCOPIC DIAGNOSIS:   A. OMENTUM, LEFT ABDOMINAL, NEEDLE CORE BIOPSY:  - Metastatic carcinoma.  See comment    COMMENT:   Immunohistochemical stains show that the tumor cells are positive for PAX8, ER, WT1, CK7 (focal) and CK 5/6 (patchy); and negative for p63, calretinin, D2-40, CK20, GATA3 and CDX2.  This mmunohistochemical profile is consistent with a gynecologic primary and suggestive of a low-grade serous carcinoma.   04/11/2019 Procedure   Image guided core biopsy of the omental disease. In addition, small amount of ascites was collected for cytology   04/18/2019 Cancer Staging   Staging form: Ovary, Fallopian Tube, and Primary Peritoneal Carcinoma, AJCC 8th Edition - Clinical stage from 04/18/2019: FIGO Stage IIIC (cT3c, cN0, cM0) - Signed by Heath Lark, MD on 04/18/2019   04/18/2019 Tumor Marker   Patient's tumor was tested for the following markers: CA-125 Results of the tumor marker test revealed 402   04/22/2019 -  Chemotherapy   The patient had carboplatin and taxol for chemotherapy treatment.       REVIEW OF SYSTEMS:   Constitutional: Denies fevers, chills or abnormal weight loss Eyes: Denies blurriness of  vision Ears, nose, mouth, throat, and face: Denies mucositis or sore throat Respiratory: Denies cough, dyspnea or wheezes Cardiovascular: Denies palpitation, chest discomfort or lower extremity swelling Gastrointestinal:   Denies nausea, heartburn or change in bowel habits Skin: Denies abnormal skin rashes Lymphatics: Denies new lymphadenopathy or easy bruising Neurological:Denies numbness, tingling or new weaknesses Behavioral/Psych: Mood is stable, no new changes  All other systems were reviewed with the patient and are negative.  I have reviewed the past medical history, past surgical history, social history and family history with the patient and they are unchanged from previous note.  ALLERGIES:  is allergic to codeine sulfate [codeine]; tetanus toxoids; horse-derived products; and sulfa antibiotics.  MEDICATIONS:  Current Outpatient Medications  Medication Sig Dispense Refill  . dexamethasone (DECADRON) 4 MG tablet Take 2 tabs at the night before and 2 tab the morning of chemotherapy, every 3 weeks, by mouth 24 tablet 6  . diltiazem (CARDIZEM) 120 MG tablet Take 1 tablet (120 mg total) by mouth daily. 90 tablet 3  . ELIQUIS 5 MG TABS tablet Take 5 mg by mouth 2 (two) times daily.    . metoprolol succinate (TOPROL XL) 25 MG 24 hr tablet Take 1.5 tablets (37.5 mg total) by mouth daily. 135 tablet 1  . omeprazole (PRILOSEC) 20 MG capsule Take 20 mg by mouth 2 (two) times daily before a meal.     . ondansetron (ZOFRAN) 8 MG tablet Take 1 tablet (8 mg total) by mouth every 8 (eight) hours as needed for refractory nausea / vomiting. 30 tablet 1  . oxyCODONE (OXY IR/ROXICODONE) 5 MG immediate release tablet Take 1 tablet (5 mg total) by mouth every 4 (four) hours as needed for severe pain. 30 tablet 0  . prochlorperazine (COMPAZINE) 10 MG tablet Take 1 tablet (10 mg total) by mouth every 6 (six) hours as needed (Nausea or vomiting). 30 tablet 1  . triamcinolone cream (KENALOG) 0.5 % Apply 1 application topically 3 (three) times daily. 90 g 1   No current facility-administered medications for this visit.   Facility-Administered Medications Ordered in Other Visits  Medication Dose Route Frequency Provider Last  Rate Last Admin  . CARBOplatin (PARAPLATIN) 380 mg in sodium chloride 0.9 % 250 mL chemo infusion  380 mg Intravenous Once Alvy Bimler, Girtha Kilgore, MD      . dexamethasone (DECADRON) 10 mg in sodium chloride 0.9 % 50 mL IVPB  10 mg Intravenous Once Alvy Bimler, Davonne Baby, MD      . fosaprepitant (EMEND) 150 mg in sodium chloride 0.9 % 145 mL IVPB  150 mg Intravenous Once Alvy Bimler, Asani Mcburney, MD      . PACLitaxel (TAXOL) 276 mg in sodium chloride 0.9 % 250 mL chemo infusion (> 80mg /m2)  175 mg/m2 (Treatment Plan Recorded) Intravenous Once Alvy Bimler, Sona Nations, MD        PHYSICAL EXAMINATION: ECOG PERFORMANCE STATUS: 1 - Symptomatic but completely ambulatory  Vitals:   05/13/19 1028  BP: 128/67  Pulse: 94  Resp: 18  Temp: 98.7 F (37.1 C)  SpO2: 99%   Filed Weights   05/13/19 1028  Weight: 125 lb 6.4 oz (56.9 kg)    GENERAL:alert, no distress and comfortable SKIN: skin color, texture, turgor are normal, no rashes or significant lesions EYES: normal, Conjunctiva are pink and non-injected, sclera clear OROPHARYNX:no exudate, no erythema and lips, buccal mucosa, and tongue normal  NECK: supple, thyroid normal size, non-tender, without nodularity LYMPH:  no palpable lymphadenopathy in the cervical, axillary or inguinal LUNGS: clear to auscultation  and percussion with normal breathing effort HEART: regular rate & rhythm and no murmurs and no lower extremity edema ABDOMEN:abdomen soft, non-tender and normal bowel sounds.  She has less abdominal distention and no pain on exam Musculoskeletal:no cyanosis of digits and no clubbing  NEURO: alert & oriented x 3 with fluent speech, no focal motor/sensory deficits  LABORATORY DATA:  I have reviewed the data as listed    Component Value Date/Time   NA 139 05/13/2019 1005   NA 137 03/04/2019 1107   K 4.4 05/13/2019 1005   CL 106 05/13/2019 1005   CO2 18 (L) 05/13/2019 1005   GLUCOSE 192 (H) 05/13/2019 1005   BUN 22 05/13/2019 1005   BUN 11 03/04/2019 1107   CREATININE 0.91  05/13/2019 1005   CALCIUM 9.4 05/13/2019 1005   PROT 8.3 (H) 05/13/2019 1005   PROT 7.4 03/04/2019 1107   ALBUMIN 3.1 (L) 05/13/2019 1005   ALBUMIN 3.6 03/04/2019 1107   AST 45 (H) 05/13/2019 1005   ALT 40 05/13/2019 1005   ALKPHOS 67 05/13/2019 1005   BILITOT <0.2 (L) 05/13/2019 1005   GFRNONAA 59 (L) 05/13/2019 1005   GFRAA >60 05/13/2019 1005    No results found for: SPEP, UPEP  Lab Results  Component Value Date   WBC 11.7 (H) 05/13/2019   NEUTROABS 9.6 (H) 05/13/2019   HGB 11.5 (L) 05/13/2019   HCT 35.2 (L) 05/13/2019   MCV 88.2 05/13/2019   PLT 372 05/13/2019      Chemistry      Component Value Date/Time   NA 139 05/13/2019 1005   NA 137 03/04/2019 1107   K 4.4 05/13/2019 1005   CL 106 05/13/2019 1005   CO2 18 (L) 05/13/2019 1005   BUN 22 05/13/2019 1005   BUN 11 03/04/2019 1107   CREATININE 0.91 05/13/2019 1005      Component Value Date/Time   CALCIUM 9.4 05/13/2019 1005   ALKPHOS 67 05/13/2019 1005   AST 45 (H) 05/13/2019 1005   ALT 40 05/13/2019 1005   BILITOT <0.2 (L) 05/13/2019 1005

## 2019-05-13 NOTE — Assessment & Plan Note (Signed)
Overall, she is doing well The recent weight loss is secondary to loss of fluid retention She have less abdominal distention Her nutrition has improved She is getting stronger I will adjust the dose of her chemotherapy accordingly The plan will be to proceed with minimum 3 cycles of treatment before repeat CT imaging

## 2019-05-13 NOTE — Assessment & Plan Note (Signed)
Her weakness is improving I encouraged her to increase oral intake as tolerated and to exercise as tolerated

## 2019-05-13 NOTE — Assessment & Plan Note (Signed)
Her pancytopenia has improved We will observe closely I will adjust the dose of her chemotherapy according to her most current weight to avoid worsening pancytopenia

## 2019-05-13 NOTE — Progress Notes (Signed)
Nutrition follow-up completed with patient receiving treatment for ovarian cancer. Patient reports she feels like a new person. She has increased energy and no longer needs to be in a wheelchair. She has been eating better and is now walking twice a day. She is enjoying her food. Weight stable at 125.4 pounds on April 5. Patient is very grateful for nutrition information.  Nutrition diagnosis: Unintended weight loss has stabilized.  Intervention: Educated patient to continue strategies for increased calories and protein for weight maintenance.  Monitoring, evaluation, goals: Patient will continue adequate calories and protein for weight maintenance.  Next visit: Monday, April 26.  **Disclaimer: This note was dictated with voice recognition software. Similar sounding words can inadvertently be transcribed and this note may contain transcription errors which may not have been corrected upon publication of note.**

## 2019-05-13 NOTE — Patient Instructions (Signed)
[facility] Discharge Instructions for Patients Receiving Chemotherapy  Today you received the following chemotherapy agents: paclitaxel and carboplatin.  To help prevent nausea and vomiting after your treatment, we encourage you to take your nausea medication as directed.   If you develop nausea and vomiting that is not controlled by your nausea medication, call the clinic.   BELOW ARE SYMPTOMS THAT SHOULD BE REPORTED IMMEDIATELY:  *FEVER GREATER THAN 100.5 F  *CHILLS WITH OR WITHOUT FEVER  NAUSEA AND VOMITING THAT IS NOT CONTROLLED WITH YOUR NAUSEA MEDICATION  *UNUSUAL SHORTNESS OF BREATH  *UNUSUAL BRUISING OR BLEEDING  TENDERNESS IN MOUTH AND THROAT WITH OR WITHOUT PRESENCE OF ULCERS  *URINARY PROBLEMS  *BOWEL PROBLEMS  UNUSUAL RASH Items with * indicate a potential emergency and should be followed up as soon as possible.  Feel free to call the clinic should you have any questions or concerns. The clinic phone number is [phone].  Please show the CHEMO ALERT CARD at check-in to the Emergency Department and triage nurse.   

## 2019-05-13 NOTE — Progress Notes (Signed)
Dr. Alvy Bimler updated carboplatin dose to 384 mg today (AUC = 6) to reflect patient's current weight.   Demetrius Charity, PharmD, BCPS, [facility] Pharmacist Pharmacy Phone: [phone] 05/13/2019

## 2019-05-13 NOTE — Assessment & Plan Note (Signed)
This is improved She has positive response to therapy She will take pain medicine as needed

## 2019-05-14 LAB — CA 125: Cancer Antigen (CA) 125: 146 U/mL — ABNORMAL HIGH (ref 0.0–38.1)

## 2019-05-28 NOTE — Progress Notes (Signed)
Pharmacist Chemotherapy Monitoring - Follow Up Assessment    I verify that I have reviewed each item in the below checklist:  . Regimen for the patient is scheduled for the appropriate day and plan matches scheduled date. Marland Kitchen Appropriate non-routine labs are ordered dependent on drug ordered. . If applicable, additional medications reviewed and ordered per protocol based on lifetime cumulative doses and/or treatment regimen.   Plan for follow-up and/or issues identified: No . I-vent associated with next due treatment: No  Whitney Floyd 05/28/2019 4:12 PM

## 2019-05-31 ENCOUNTER — Encounter: Payer: Self-pay | Admitting: Hematology and Oncology

## 2019-05-31 NOTE — Progress Notes (Signed)
Called pt to introduce myself as her Arboriculturist and ended up talking w/ her daughter-in-law.  Unfortunately there aren't any foundations offering copay assistance for her Dx and the type of ins she has.  I offered the [employer], went over what it covers and gave her the income requirement.  She will discuss everything w/ the pt and if she would like to apply she will bring proof of income on 06/03/19.  If approved I will give her an expense sheet and my card for any questions or concerns she may have in the future.

## 2019-06-03 ENCOUNTER — Telehealth: Payer: Self-pay

## 2019-06-03 ENCOUNTER — Inpatient Hospital Stay: Payer: Medicare HMO

## 2019-06-03 ENCOUNTER — Inpatient Hospital Stay: Payer: Medicare HMO | Admitting: Nutrition

## 2019-06-03 ENCOUNTER — Encounter: Payer: Self-pay | Admitting: Hematology and Oncology

## 2019-06-03 ENCOUNTER — Other Ambulatory Visit: Payer: Self-pay

## 2019-06-03 ENCOUNTER — Inpatient Hospital Stay: Payer: Medicare HMO | Admitting: Hematology and Oncology

## 2019-06-03 ENCOUNTER — Telehealth: Payer: Self-pay | Admitting: Hematology and Oncology

## 2019-06-03 VITALS — BP 126/59 | HR 87 | Temp 98.2°F | Resp 18 | Ht 62.0 in | Wt 123.8 lb

## 2019-06-03 DIAGNOSIS — Z79899 Other long term (current) drug therapy: Secondary | ICD-10-CM | POA: Diagnosis not present

## 2019-06-03 DIAGNOSIS — R64 Cachexia: Secondary | ICD-10-CM

## 2019-06-03 DIAGNOSIS — R971 Elevated cancer antigen 125 [CA 125]: Secondary | ICD-10-CM

## 2019-06-03 DIAGNOSIS — Z7189 Other specified counseling: Secondary | ICD-10-CM

## 2019-06-03 DIAGNOSIS — C562 Malignant neoplasm of left ovary: Secondary | ICD-10-CM | POA: Diagnosis not present

## 2019-06-03 DIAGNOSIS — Z7901 Long term (current) use of anticoagulants: Secondary | ICD-10-CM | POA: Diagnosis not present

## 2019-06-03 DIAGNOSIS — R188 Other ascites: Secondary | ICD-10-CM | POA: Diagnosis not present

## 2019-06-03 DIAGNOSIS — Z7952 Long term (current) use of systemic steroids: Secondary | ICD-10-CM | POA: Diagnosis not present

## 2019-06-03 DIAGNOSIS — C786 Secondary malignant neoplasm of retroperitoneum and peritoneum: Secondary | ICD-10-CM | POA: Diagnosis not present

## 2019-06-03 DIAGNOSIS — R109 Unspecified abdominal pain: Secondary | ICD-10-CM | POA: Diagnosis not present

## 2019-06-03 DIAGNOSIS — D61818 Other pancytopenia: Secondary | ICD-10-CM | POA: Diagnosis not present

## 2019-06-03 DIAGNOSIS — Z5111 Encounter for antineoplastic chemotherapy: Secondary | ICD-10-CM | POA: Diagnosis not present

## 2019-06-03 DIAGNOSIS — C800 Disseminated malignant neoplasm, unspecified: Secondary | ICD-10-CM

## 2019-06-03 LAB — CBC WITH DIFFERENTIAL (CANCER CENTER ONLY)
Abs Immature Granulocytes: 0.06 10*3/uL (ref 0.00–0.07)
Basophils Absolute: 0 10*3/uL (ref 0.0–0.1)
Basophils Relative: 0 %
Eosinophils Absolute: 0 10*3/uL (ref 0.0–0.5)
Eosinophils Relative: 0 %
HCT: 33.3 % — ABNORMAL LOW (ref 36.0–46.0)
Hemoglobin: 10.9 g/dL — ABNORMAL LOW (ref 12.0–15.0)
Immature Granulocytes: 1 %
Lymphocytes Relative: 15 %
Lymphs Abs: 1.3 10*3/uL (ref 0.7–4.0)
MCH: 29 pg (ref 26.0–34.0)
MCHC: 32.7 g/dL (ref 30.0–36.0)
MCV: 88.6 fL (ref 80.0–100.0)
Monocytes Absolute: 0.3 10*3/uL (ref 0.1–1.0)
Monocytes Relative: 3 %
Neutro Abs: 6.8 10*3/uL (ref 1.7–7.7)
Neutrophils Relative %: 81 %
Platelet Count: 263 10*3/uL (ref 150–400)
RBC: 3.76 MIL/uL — ABNORMAL LOW (ref 3.87–5.11)
RDW: 17.1 % — ABNORMAL HIGH (ref 11.5–15.5)
WBC Count: 8.4 10*3/uL (ref 4.0–10.5)
nRBC: 0 % (ref 0.0–0.2)

## 2019-06-03 LAB — CMP (CANCER CENTER ONLY)
ALT: 23 U/L (ref 0–44)
AST: 29 U/L (ref 15–41)
Albumin: 3.3 g/dL — ABNORMAL LOW (ref 3.5–5.0)
Alkaline Phosphatase: 67 U/L (ref 38–126)
Anion gap: 16 — ABNORMAL HIGH (ref 5–15)
BUN: 19 mg/dL (ref 8–23)
CO2: 17 mmol/L — ABNORMAL LOW (ref 22–32)
Calcium: 9.3 mg/dL (ref 8.9–10.3)
Chloride: 107 mmol/L (ref 98–111)
Creatinine: 0.93 mg/dL (ref 0.44–1.00)
GFR, Est AFR Am: >60 mL/min (ref >60)
GFR, Estimated: 57 mL/min — ABNORMAL LOW (ref >60)
Glucose, Bld: 217 mg/dL — ABNORMAL HIGH (ref 70–99)
Potassium: 3.9 mmol/L (ref 3.5–5.1)
Sodium: 140 mmol/L (ref 135–145)
Total Bilirubin: 0.2 mg/dL — ABNORMAL LOW (ref 0.3–1.2)
Total Protein: 8.4 g/dL — ABNORMAL HIGH (ref 6.5–8.1)

## 2019-06-03 MED ORDER — SODIUM CHLORIDE 0.9 % IV SOLN
Freq: Once | INTRAVENOUS | Status: AC
  Filled 2019-06-03: qty 250

## 2019-06-03 MED ORDER — SODIUM CHLORIDE 0.9 % IV SOLN
379.8000 mg | Freq: Once | INTRAVENOUS | Status: AC
  Administered 2019-06-03: 379.8 mg via INTRAVENOUS
  Filled 2019-06-03: qty 37.98

## 2019-06-03 MED ORDER — DIPHENHYDRAMINE HCL 50 MG/ML IJ SOLN
25.0000 mg | Freq: Once | INTRAMUSCULAR | Status: AC
  Administered 2019-06-03: 25 mg via INTRAVENOUS

## 2019-06-03 MED ORDER — FAMOTIDINE IN NACL 20-0.9 MG/50ML-% IV SOLN
INTRAVENOUS | Status: AC
  Filled 2019-06-03: qty 50

## 2019-06-03 MED ORDER — PALONOSETRON HCL INJECTION 0.25 MG/5ML
INTRAVENOUS | Status: AC
  Filled 2019-06-03: qty 5

## 2019-06-03 MED ORDER — SODIUM CHLORIDE 0.9 % IV SOLN
175.0000 mg/m2 | Freq: Once | INTRAVENOUS | Status: AC
  Administered 2019-06-03: 13:00:00 276 mg via INTRAVENOUS
  Filled 2019-06-03: qty 46

## 2019-06-03 MED ORDER — SODIUM CHLORIDE 0.9 % IV SOLN
150.0000 mg | Freq: Once | INTRAVENOUS | Status: AC
  Administered 2019-06-03: 13:00:00 150 mg via INTRAVENOUS
  Filled 2019-06-03: qty 150

## 2019-06-03 MED ORDER — PALONOSETRON HCL INJECTION 0.25 MG/5ML
0.2500 mg | Freq: Once | INTRAVENOUS | Status: AC
  Administered 2019-06-03: 12:00:00 0.25 mg via INTRAVENOUS

## 2019-06-03 MED ORDER — DIPHENHYDRAMINE HCL 50 MG/ML IJ SOLN
INTRAMUSCULAR | Status: AC
  Filled 2019-06-03: qty 1

## 2019-06-03 MED ORDER — SODIUM CHLORIDE 0.9 % IV SOLN
10.0000 mg | Freq: Once | INTRAVENOUS | Status: AC
  Administered 2019-06-03: 10 mg via INTRAVENOUS
  Filled 2019-06-03: qty 10

## 2019-06-03 MED ORDER — FAMOTIDINE IN NACL 20-0.9 MG/50ML-% IV SOLN
20.0000 mg | Freq: Once | INTRAVENOUS | Status: AC
  Administered 2019-06-03: 12:00:00 20 mg via INTRAVENOUS

## 2019-06-03 NOTE — Patient Instructions (Signed)
[facility] Discharge Instructions for Patients Receiving Chemotherapy  Today you received the following chemotherapy agents: paclitaxel and carboplatin.  To help prevent nausea and vomiting after your treatment, we encourage you to take your nausea medication as directed.   If you develop nausea and vomiting that is not controlled by your nausea medication, call the clinic.   BELOW ARE SYMPTOMS THAT SHOULD BE REPORTED IMMEDIATELY:  *FEVER GREATER THAN 100.5 F  *CHILLS WITH OR WITHOUT FEVER  NAUSEA AND VOMITING THAT IS NOT CONTROLLED WITH YOUR NAUSEA MEDICATION  *UNUSUAL SHORTNESS OF BREATH  *UNUSUAL BRUISING OR BLEEDING  TENDERNESS IN MOUTH AND THROAT WITH OR WITHOUT PRESENCE OF ULCERS  *URINARY PROBLEMS  *BOWEL PROBLEMS  UNUSUAL RASH Items with * indicate a potential emergency and should be followed up as soon as possible.  Feel free to call the clinic should you have any questions or concerns. The clinic phone number is [phone].  Please show the CHEMO ALERT CARD at check-in to the Emergency Department and triage nurse.   

## 2019-06-03 NOTE — Progress Notes (Signed)
Nutrition follow-up completed with patient during infusion for ovarian cancer. Patient continues to feel better. Weight documented as 123.8 pounds April 26.   Patient reports her oral intake is improving. She is drinking Ensure max protein providing 150 cal and 30 g of protein.  Nutrition diagnosis: Unintended weight loss is stabilized.  Intervention: Patient educated to add 1 bottle Ensure Enlive to provide 350 cal and 20 g protein per bottle.  She will continue 1 bottle of Ensure max protein daily as tolerated. Reviewed other high-calorie high-protein foods. Questions answered.  Monitoring, evaluation, goals: Patient will tolerate adequate calories and protein to minimize weight loss.  Next visit: Patient will contact me by phone as needed.  **Disclaimer: This note was dictated with voice recognition software. Similar sounding words can inadvertently be transcribed and this note may contain transcription errors which may not have been corrected upon publication of note.**

## 2019-06-03 NOTE — Telephone Encounter (Signed)
Scheduled appts per 4/26 sch msg. Left voicemail with appt date and time.

## 2019-06-03 NOTE — Assessment & Plan Note (Signed)
She continues to improve clinically Her examination is improving and her tumor marker is also improving We will proceed with cycle 3 of chemotherapy as scheduled I plan to order CT imaging next week She will see her GYN surgeon next week for further assessment to see if she is a candidate for interval debulking surgery I will schedule tentative cycle 4 of therapy on May 17

## 2019-06-03 NOTE — Assessment & Plan Note (Signed)
Her oral intake is improving She is doing well Her serum albumin is also improving I continue to encourage her to increase oral intake as tolerated

## 2019-06-03 NOTE — Telephone Encounter (Signed)
Faxed request for surgical clearance alone with office note from Dr. Alvy Bimler from 06-03-19 and Dr. Charisse March note from 04-03-19.

## 2019-06-03 NOTE — Progress Notes (Signed)
[facility] OFFICE PROGRESS NOTE  Patient Care Team: Ernestene Kiel, MD as PCP - General (Internal Medicine) Berniece Salines, DO as PCP - Cardiology (Cardiology) Awanda Mink Craige Cotta, RN as Oncology Nurse Navigator (Oncology)  ASSESSMENT & PLAN:  Ovarian cancer, left [facility]) She continues to improve clinically Her examination is improving and her tumor marker is also improving We will proceed with cycle 3 of chemotherapy as scheduled I plan to order CT imaging next week She will see her GYN surgeon next week for further assessment to see if she is a candidate for interval debulking surgery I will schedule tentative cycle 4 of therapy on May 17  Abdominal pain She continues to have mild intermittent abdominal pain but she has not taken any oxycodone since last time I saw her Her examination is benign with soft abdomen We will repeat CT imaging next week as scheduled  Malignant cachexia ([place]) Her oral intake is improving She is doing well Her serum albumin is also improving I continue to encourage her to increase oral intake as tolerated   Orders Placed This Encounter  Procedures  . CT ABDOMEN PELVIS W CONTRAST    Standing Status:   Future    Standing Expiration Date:   06/02/2020    Order Specific Question:   If indicated for the ordered procedure, I authorize the administration of contrast media per Radiology protocol    Answer:   Yes    Order Specific Question:   Preferred imaging location?    Answer:   [facility]    Order Specific Question:   Radiology Contrast Protocol - do NOT remove file path    Answer:   [email or web address]    All questions were answered. The patient knows to call the clinic with any problems, questions or concerns. The total time spent in the appointment was 20 minutes encounter with patients including review of chart and various tests results, discussions about plan of care and coordination of care plan   Heath Lark, MD 06/03/2019 10:36 AM  INTERVAL HISTORY: Please see below for problem oriented charting. She returns with her daughter-in-law for further follow-up She is doing well She tolerated chemotherapy well She had one episode of abdominal pain on the right upper quadrant last Friday but that resolved spontaneously without taking any over-the-counter analgesics or oxycodone She denies peripheral neuropathy No nausea She had regular multiple bowel movement Her energy level is good No recent infection, fever or chills  SUMMARY OF ONCOLOGIC HISTORY: Oncology History  Carcinomatosis ([place])  04/03/2019 Initial Diagnosis   Carcinomatosis ([place])   Ovarian cancer, left ([place])  02/07/2019 Imaging   Outside CT chest showed no evidence of metastatic disease   03/20/2019 Imaging   Outside Ct abdomen and pelvis showed diffuse carcinomatosis, left adnexa mass and ascites   04/11/2019 Pathology Results   FINAL MICROSCOPIC DIAGNOSIS:   A. OMENTUM, LEFT ABDOMINAL, NEEDLE CORE BIOPSY:  - Metastatic carcinoma.  See comment    COMMENT:   Immunohistochemical stains show that the tumor cells are positive for PAX8, ER, WT1, CK7 (focal) and CK 5/6 (patchy); and negative for p63, calretinin, D2-40, CK20, GATA3 and CDX2.  This mmunohistochemical profile is consistent with a gynecologic primary and suggestive of a low-grade serous carcinoma.   04/11/2019 Procedure   Image guided core biopsy of the omental disease. In addition, small amount of ascites was collected for cytology   04/18/2019 Cancer Staging   Staging form: Ovary, Fallopian Tube, and Primary Peritoneal Carcinoma, AJCC  8th Edition - Clinical stage from 04/18/2019: FIGO Stage IIIC (cT3c, cN0, cM0) - Signed by Heath Lark, MD on 04/18/2019   04/18/2019 Tumor Marker   Patient's tumor was tested for the following markers: CA-125 Results of the tumor marker test revealed 402   04/22/2019 -  Chemotherapy   The patient had carboplatin and taxol for  chemotherapy treatment.     05/13/2019 Tumor Marker   Patient's tumor was tested for the following markers: CA-125 Results of the tumor marker test revealed 146     REVIEW OF SYSTEMS:   Constitutional: Denies fevers, chills or abnormal weight loss Eyes: Denies blurriness of vision Ears, nose, mouth, throat, and face: Denies mucositis or sore throat Respiratory: Denies cough, dyspnea or wheezes Cardiovascular: Denies palpitation, chest discomfort or lower extremity swelling Gastrointestinal:  Denies nausea, heartburn or change in bowel habits Skin: Denies abnormal skin rashes Lymphatics: Denies new lymphadenopathy or easy bruising Neurological:Denies numbness, tingling or new weaknesses Behavioral/Psych: Mood is stable, no new changes  All other systems were reviewed with the patient and are negative.  I have reviewed the past medical history, past surgical history, social history and family history with the patient and they are unchanged from previous note.  ALLERGIES:  is allergic to codeine sulfate [codeine]; tetanus toxoids; horse-derived products; and sulfa antibiotics.  MEDICATIONS:  Current Outpatient Medications  Medication Sig Dispense Refill  . dexamethasone (DECADRON) 4 MG tablet Take 2 tabs at the night before and 2 tab the morning of chemotherapy, every 3 weeks, by mouth 24 tablet 6  . diltiazem (CARDIZEM) 120 MG tablet Take 1 tablet (120 mg total) by mouth daily. 90 tablet 3  . ELIQUIS 5 MG TABS tablet Take 5 mg by mouth 2 (two) times daily.    . metoprolol succinate (TOPROL XL) 25 MG 24 hr tablet Take 1.5 tablets (37.5 mg total) by mouth daily. 135 tablet 1  . omeprazole (PRILOSEC) 20 MG capsule Take 20 mg by mouth 2 (two) times daily before a meal.     . ondansetron (ZOFRAN) 8 MG tablet Take 1 tablet (8 mg total) by mouth every 8 (eight) hours as needed for refractory nausea / vomiting. 30 tablet 1  . oxyCODONE (OXY IR/ROXICODONE) 5 MG immediate release tablet Take 1  tablet (5 mg total) by mouth every 4 (four) hours as needed for severe pain. 30 tablet 0  . prochlorperazine (COMPAZINE) 10 MG tablet Take 1 tablet (10 mg total) by mouth every 6 (six) hours as needed (Nausea or vomiting). 30 tablet 1  . triamcinolone cream (KENALOG) 0.5 % Apply 1 application topically 3 (three) times daily. 90 g 1   No current facility-administered medications for this visit.    PHYSICAL EXAMINATION: ECOG PERFORMANCE STATUS: 1 - Symptomatic but completely ambulatory  Vitals:   06/03/19 1013  BP: (!) 126/59  Pulse: 87  Resp: 18  Temp: 98.2 F (36.8 C)  SpO2: 97%   Filed Weights   06/03/19 1013  Weight: 123 lb 12.8 oz (56.2 kg)    GENERAL:alert, no distress and comfortable SKIN: skin color, texture, turgor are normal, no rashes or significant lesions EYES: normal, Conjunctiva are pink and non-injected, sclera clear OROPHARYNX:no exudate, no erythema and lips, buccal mucosa, and tongue normal  NECK: supple, thyroid normal size, non-tender, without nodularity LYMPH:  no palpable lymphadenopathy in the cervical, axillary or inguinal LUNGS: clear to auscultation and percussion with normal breathing effort HEART: regular rate & rhythm and no murmurs and no lower extremity edema  ABDOMEN:abdomen soft, non-tender and normal bowel sounds Musculoskeletal:no cyanosis of digits and no clubbing  NEURO: alert & oriented x 3 with fluent speech, no focal motor/sensory deficits  LABORATORY DATA:  I have reviewed the data as listed    Component Value Date/Time   NA 140 06/03/2019 0953   NA 137 03/04/2019 1107   K 3.9 06/03/2019 0953   CL 107 06/03/2019 0953   CO2 17 (L) 06/03/2019 0953   GLUCOSE 217 (H) 06/03/2019 0953   BUN 19 06/03/2019 0953   BUN 11 03/04/2019 1107   CREATININE 0.93 06/03/2019 0953   CALCIUM 9.3 06/03/2019 0953   PROT 8.4 (H) 06/03/2019 0953   PROT 7.4 03/04/2019 1107   ALBUMIN 3.3 (L) 06/03/2019 0953   ALBUMIN 3.6 03/04/2019 1107   AST 29  06/03/2019 0953   ALT 23 06/03/2019 0953   ALKPHOS 67 06/03/2019 0953   BILITOT 0.2 (L) 06/03/2019 0953   GFRNONAA 57 (L) 06/03/2019 0953   GFRAA >60 06/03/2019 0953    No results found for: SPEP, UPEP  Lab Results  Component Value Date   WBC 8.4 06/03/2019   NEUTROABS 6.8 06/03/2019   HGB 10.9 (L) 06/03/2019   HCT 33.3 (L) 06/03/2019   MCV 88.6 06/03/2019   PLT 263 06/03/2019      Chemistry      Component Value Date/Time   NA 140 06/03/2019 0953   NA 137 03/04/2019 1107   K 3.9 06/03/2019 0953   CL 107 06/03/2019 0953   CO2 17 (L) 06/03/2019 0953   BUN 19 06/03/2019 0953   BUN 11 03/04/2019 1107   CREATININE 0.93 06/03/2019 0953      Component Value Date/Time   CALCIUM 9.3 06/03/2019 0953   ALKPHOS 67 06/03/2019 0953   AST 29 06/03/2019 0953   ALT 23 06/03/2019 0953   BILITOT 0.2 (L) 06/03/2019 [id number]

## 2019-06-03 NOTE — Assessment & Plan Note (Signed)
She continues to have mild intermittent abdominal pain but she has not taken any oxycodone since last time I saw her Her examination is benign with soft abdomen We will repeat CT imaging next week as scheduled

## 2019-06-06 NOTE — Telephone Encounter (Signed)
Received surgical Clearance and  eliquis recommendations for perioperative time from Dr. Harriet Masson. Melissa Cross,NP notified and Clearance form placed in patient's chart.

## 2019-06-08 NOTE — Progress Notes (Signed)
Gynecologic Oncology Return Clinic Visit  06/13/19  Reason for Visit: Consideration of interval debulking surgery in the setting of metastatic gynecologic cancer  Treatment History: Oncology History  Carcinomatosis ([place])  04/03/2019 Initial Diagnosis   Carcinomatosis ([place])   Ovarian cancer, left ([place])  02/07/2019 Imaging   Outside CT chest showed no evidence of metastatic disease   03/20/2019 Imaging   Outside Ct abdomen and pelvis showed diffuse carcinomatosis, left adnexa mass and ascites   04/11/2019 Pathology Results   FINAL MICROSCOPIC DIAGNOSIS:   A. OMENTUM, LEFT ABDOMINAL, NEEDLE CORE BIOPSY:  - Metastatic carcinoma.  See comment    COMMENT:   Immunohistochemical stains show that the tumor cells are positive for PAX8, ER, WT1, CK7 (focal) and CK 5/6 (patchy); and negative for p63, calretinin, D2-40, CK20, GATA3 and CDX2.  This mmunohistochemical profile is consistent with a gynecologic primary and suggestive of a low-grade serous carcinoma.   04/11/2019 Procedure   Image guided core biopsy of the omental disease. In addition, small amount of ascites was collected for cytology   04/18/2019 Cancer Staging   Staging form: Ovary, Fallopian Tube, and Primary Peritoneal Carcinoma, AJCC 8th Edition - Clinical stage from 04/18/2019: FIGO Stage IIIC (cT3c, cN0, cM0) - Signed by Heath Lark, MD on 04/18/2019   04/18/2019 Tumor Marker   Patient's tumor was tested for the following markers: CA-125 Results of the tumor marker test revealed 402   04/22/2019 -  Chemotherapy   The patient had carboplatin and taxol for chemotherapy treatment.     05/13/2019 Tumor Marker   Patient's tumor was tested for the following markers: CA-125 Results of the tumor marker test revealed 146   06/12/2019 Imaging   2.6 cm left adnexal mass in this patient with known ovarian cancer, previously 3.3 cm.   Improving peritoneal disease/omental caking, as above.   No abdominopelvic ascites.     Interval  History: The patient reports overall doing well.  She tolerated cycle 2 of chemotherapy exceptionally well but has struggled a little bit with cycle 3.  Her appetite has been mostly down that she has lost the taste for most foods and feels that she is forcing herself to eat.  Her bowel function is improving and she uses Senokot and/or MiraLAX as needed.  She denies any nausea or emesis but reports some indigestion and cannot eat much especially at night as she still has early satiety and bloating.  She has had several episodes of atrial fibrillation since I saw her last.  She has occasional shortness of breath with her A. fib episodes but otherwise denies shortness of breath or chest pain.  Past Medical/Surgical History: Past Medical History:  Diagnosis Date  . Anemia   . Aortic atherosclerosis ([place])   . Arthritis   . Atrial fibrillation ([place])   . 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 Carcinomatosis ([place])   . Cataract   . Diverticulosis   . GERD (gastroesophageal reflux disease)   . Hyperlipidemia   . Osteopenia   . Paroxysmal A-fib ([place]) 02/08/2019  . Peptic ulcer disease     Past Surgical History:  Procedure Laterality Date  . benign bone tumor removal    . CATARACT EXTRACTION, BILATERAL    . COLONOSCOPY    . TUBAL LIGATION      Family History  Problem Relation Age of Onset  . Diabetes Mother   . Dementia Mother   . Other Mother        Had  large benign ovarian mass removed surgically  . Arrhythmia Father   . Stroke Father   . Ovarian cancer Paternal Grandmother        Thinks that she died relatively young  . Breast cancer Paternal Aunt        Died in her 75s or 49s  . Colon cancer Neg Hx   . Uterine cancer Neg Hx   . Prostate cancer Neg Hx     Social History   Socioeconomic History  . Marital status: Divorced    Spouse name: Not on file  . Number of children: 6  . Years of education: Not on file  . Highest education level: Not on file  Occupational  History  . Occupation: retired Sales executive  Tobacco Use  . Smoking status: Never Smoker  . Smokeless tobacco: Never Used  Substance and Sexual Activity  . Alcohol use: Never  . Drug use: Never  . Sexual activity: Not Currently  Other Topics Concern  . Not on file  Social History Narrative   Lived with son, Quita Skye   Social Determinants of Health   Financial Resource Strain:   . Difficulty of Paying Living Expenses:   Food Insecurity:   . Worried About Charity fundraiser in the Last Year:   . Arboriculturist in the Last Year:   Transportation Needs:   . Film/video editor (Medical):   Marland Kitchen Lack of Transportation (Non-Medical):   Physical Activity:   . Days of Exercise per Week:   . Minutes of Exercise per Session:   Stress:   . Feeling of Stress :   Social Connections:   . Frequency of Communication with Friends and Family:   . Frequency of Social Gatherings with Friends and Family:   . Attends Religious Services:   . Active Member of Clubs or Organizations:   . Attends Archivist Meetings:   Marland Kitchen Marital Status:     Current Medications:  Current Outpatient Medications:  .  dexamethasone (DECADRON) 4 MG tablet, Take 2 tabs at the night before and 2 tab the morning of chemotherapy, every 3 weeks, by mouth, Disp: 24 tablet, Rfl: 6 .  diltiazem (CARDIZEM) 120 MG tablet, Take 1 tablet (120 mg total) by mouth daily., Disp: 90 tablet, Rfl: 3 .  ELIQUIS 5 MG TABS tablet, Take 5 mg by mouth 2 (two) times daily., Disp: , Rfl:  .  metoprolol succinate (TOPROL XL) 25 MG 24 hr tablet, Take 1.5 tablets (37.5 mg total) by mouth daily., Disp: 135 tablet, Rfl: 1 .  omeprazole (PRILOSEC) 20 MG capsule, Take 20 mg by mouth 2 (two) times daily before a meal. , Disp: , Rfl:  .  ondansetron (ZOFRAN) 8 MG tablet, Take 1 tablet (8 mg total) by mouth every 8 (eight) hours as needed for refractory nausea / vomiting., Disp: 30 tablet, Rfl: 1 .  oxyCODONE (OXY IR/ROXICODONE) 5 MG  immediate release tablet, Take 1 tablet (5 mg total) by mouth every 4 (four) hours as needed for severe pain., Disp: 30 tablet, Rfl: 0 .  prochlorperazine (COMPAZINE) 10 MG tablet, Take 1 tablet (10 mg total) by mouth every 6 (six) hours as needed (Nausea or vomiting)., Disp: 30 tablet, Rfl: 1 .  triamcinolone cream (KENALOG) 0.5 %, Apply 1 application topically 3 (three) times daily., Disp: 90 g, Rfl: 1  Review of Systems: Reports minor neuropathy, pain at night in her toes/ankles, fatigue, joint pain, abdominal pain Denies appetite changes, fevers, chills,  unexplained weight changes. Denies hearing loss, neck lumps or masses, mouth sores, ringing in ears or voice changes. Denies cough or wheezing.  Denies shortness of breath. Denies chest pain or palpitations. Denies leg swelling. Denies abdominal distention, blood in stools, constipation, diarrhea, nausea, vomiting, or early satiety. Denies pain with intercourse, dysuria, frequency, hematuria or incontinence. Denies hot flashes, pelvic pain, vaginal bleeding or vaginal discharge.   Denies back pain or muscle pain/cramps. Denies itching, rash, or wounds. Denies dizziness, headaches, numbness or seizures. Denies swollen lymph nodes or glands, denies easy bruising or bleeding. Denies anxiety, depression, confusion, or decreased concentration.  Physical Exam: BP (!) 113/55 (BP Location: Left Arm, Patient Position: Sitting)   Pulse 73   Temp 98.2 F (36.8 C) (Temporal)   Ht 5\' 2"  (1.575 m)   Wt 121 lb 2 oz (54.9 kg)   SpO2 100%   BMI 22.15 kg/m  General: Alert, oriented, no acute distress. HEENT: Normocephalic, atraumatic, sclera anicteric. Chest: Clear to auscultation bilaterally.  No wheezes or rhonchi. Cardiovascular: Regular rate and rhythm, no murmurs. Abdomen: soft, nontender.  Normoactive bowel sounds.  Overall decreased size and firmness of the omental cake in the left upper abdomen and left abdomen, less tension.  Laboratory  & Radiologic Studies: CT A/P on 5/5: 2.6 cm left adnexal mass in this patient with known ovarian cancer, previously 3.3 cm.  Improving peritoneal disease/omental caking, as above.  No abdominopelvic ascites.  Assessment & Plan: Whitney Floyd is a 85 y.o. woman with metastatic carcinoma of GYN origin now status post 3 cycles of neoadjuvant chemotherapy with radiologic and tumor marker improvement although with significant omental disease still who presents for discussion about interval debulking surgery.  Overall, the patient has done very well with chemotherapy.  She has some fatigue and minimal neuropathy, but is generally feeling much better from a cancer symptom standpoint.  We discussed in detail the findings from her CT scan yesterday with improvement in reduction in disease burden.  She still has a good sized omental cake and has had decreased although not complete normalization of her CA-125.  Given her imaging, I favor a fourth cycle of neoadjuvant chemotherapy prior to interval debulking surgery.  I think she is a good candidate for surgery, and I will attempt to do this in a minimally invasive fashion although a many laparotomy may be required for abdominal exploration and/or omental excision.  The patient is amenable with this and is already tentatively scheduled for cycle 4 of chemotherapy with Dr. Alvy Bimler on 5/17.  We have scheduled her for debulking surgery on June 15.  The plan will be for a robotic assisted hysterectomy, bilateral salpingo-oophorectomy, omentectomy, possible tumor debulking, possible laparotomy. The risks of surgery were discussed in detail and she understands these to include infection; wound separation; hernia; vaginal cuff separation, injury to adjacent organs such as bowel, bladder, blood vessels, ureters and nerves; bleeding which may require blood transfusion; anesthesia risk; thromboembolic events; possible death; unforeseen complications; possible need  for re-exploration; medical complications such as heart attack, stroke, pleural effusion and pneumonia; and, if full lymphadenectomy is performed the risk of lymphedema and lymphocyst. The patient will receive DVT and antibiotic prophylaxis as indicated. She voiced a clear understanding. She had the opportunity to ask questions. Perioperative instructions were reviewed with her.   35 minutes of total time was spent for this patient encounter, including preparation, face-to-face counseling with the patient and coordination of care, and documentation of the encounter.  Jeral Pinch, MD  Division of Gynecologic Oncology  Department of Obstetrics and Gynecology  [facility]

## 2019-06-12 ENCOUNTER — Other Ambulatory Visit: Payer: Self-pay

## 2019-06-12 ENCOUNTER — Ambulatory Visit (HOSPITAL_COMMUNITY)
Admission: RE | Admit: 2019-06-12 | Discharge: 2019-06-12 | Disposition: A | Discharge status: Home / Self Care | Payer: Medicare HMO | Source: Ambulatory Visit | Attending: Hematology and Oncology | Admitting: Hematology and Oncology

## 2019-06-12 DIAGNOSIS — C569 Malignant neoplasm of unspecified ovary: Secondary | ICD-10-CM | POA: Diagnosis not present

## 2019-06-12 DIAGNOSIS — C562 Malignant neoplasm of left ovary: Secondary | ICD-10-CM | POA: Diagnosis not present

## 2019-06-12 MED ORDER — IOHEXOL 300 MG/ML  SOLN
100.0000 mL | Freq: Once | INTRAMUSCULAR | Status: AC | PRN
  Administered 2019-06-12: 100 mL via INTRAVENOUS

## 2019-06-12 MED ORDER — SODIUM CHLORIDE (PF) 0.9 % IJ SOLN
INTRAMUSCULAR | Status: AC
  Filled 2019-06-12: qty 50

## 2019-06-13 ENCOUNTER — Other Ambulatory Visit: Payer: Self-pay

## 2019-06-13 ENCOUNTER — Inpatient Hospital Stay: Payer: Medicare HMO | Attending: Gynecologic Oncology | Admitting: Gynecologic Oncology

## 2019-06-13 ENCOUNTER — Encounter: Payer: Self-pay | Admitting: Gynecologic Oncology

## 2019-06-13 VITALS — BP 113/55 | HR 73 | Temp 98.2°F | Ht 62.0 in | Wt 121.1 lb

## 2019-06-13 DIAGNOSIS — D61818 Other pancytopenia: Secondary | ICD-10-CM | POA: Diagnosis not present

## 2019-06-13 DIAGNOSIS — I480 Paroxysmal atrial fibrillation: Secondary | ICD-10-CM | POA: Insufficient documentation

## 2019-06-13 DIAGNOSIS — K1231 Oral mucositis (ulcerative) due to antineoplastic therapy: Secondary | ICD-10-CM | POA: Diagnosis not present

## 2019-06-13 DIAGNOSIS — K219 Gastro-esophageal reflux disease without esophagitis: Secondary | ICD-10-CM | POA: Diagnosis not present

## 2019-06-13 DIAGNOSIS — R739 Hyperglycemia, unspecified: Secondary | ICD-10-CM | POA: Insufficient documentation

## 2019-06-13 DIAGNOSIS — E785 Hyperlipidemia, unspecified: Secondary | ICD-10-CM | POA: Insufficient documentation

## 2019-06-13 DIAGNOSIS — C786 Secondary malignant neoplasm of retroperitoneum and peritoneum: Secondary | ICD-10-CM | POA: Insufficient documentation

## 2019-06-13 DIAGNOSIS — M8580 Other specified disorders of bone density and structure, unspecified site: Secondary | ICD-10-CM | POA: Insufficient documentation

## 2019-06-13 DIAGNOSIS — Z7901 Long term (current) use of anticoagulants: Secondary | ICD-10-CM | POA: Insufficient documentation

## 2019-06-13 DIAGNOSIS — Z7952 Long term (current) use of systemic steroids: Secondary | ICD-10-CM | POA: Insufficient documentation

## 2019-06-13 DIAGNOSIS — Z5111 Encounter for antineoplastic chemotherapy: Secondary | ICD-10-CM | POA: Insufficient documentation

## 2019-06-13 DIAGNOSIS — C562 Malignant neoplasm of left ovary: Secondary | ICD-10-CM | POA: Diagnosis not present

## 2019-06-13 DIAGNOSIS — G620 Drug-induced polyneuropathy: Secondary | ICD-10-CM | POA: Insufficient documentation

## 2019-06-13 DIAGNOSIS — T380X5A Adverse effect of glucocorticoids and synthetic analogues, initial encounter: Secondary | ICD-10-CM | POA: Diagnosis not present

## 2019-06-13 DIAGNOSIS — Z79899 Other long term (current) drug therapy: Secondary | ICD-10-CM | POA: Insufficient documentation

## 2019-06-13 NOTE — Patient Instructions (Addendum)
Preparing for your Surgery  Plan for surgery on July 23, 2019 with Dr. Jeral Pinch at [place] will be scheduled for a robotic assisted total laparoscopic hysterectomy (removal of uterus and cervix), bilateral salpingo-oophorectomy (removal of fallopian tubes and ovaries), omentectomy, tumor debulking, possible laparotomy (larger incision).   Pre-operative Testing -You will receive a phone call from presurgical testing at [facility] to arrange for a pre-operative appointment over the phone, lab appointment, and COVID test. The COVID test normally happens 3 days prior to the surgery and they ask that you self quarantine after the test up until surgery to decrease chance of exposure.  -Bring your insurance card, copy of an advanced directive if applicable, medication list  -At that visit, you will be asked to sign a consent for a possible blood transfusion in case a transfusion becomes necessary during surgery.  The need for a blood transfusion is rare but having consent is a necessary part of your care.    -YOU WILL NEED TO HOLD YOUR ELIQUIS FOR 4 DOSES PRIOR TO SURGERY SO YOUR LAST DOSE WOULD BE LAST DOSE Saturday, July 20, 2019.  -Do not take supplements such as fish oil (omega 3), red yeast rice, turmeric before your surgery.   Day Before Surgery at [place] will be asked to take in a light diet the day before surgery.  Avoid carbonated beverages.  You will be advised you can have clear liquids after midnight and up until 3 hours before your surgery.    Eat a light diet the day before surgery.  Examples including soups, broths, toast, yogurt, mashed potatoes.  Things to avoid include carbonated beverages (fizzy beverages), raw fruits and raw vegetables, or beans.   If your bowels are filled with gas, your surgeon will have difficulty visualizing your pelvic organs which increases your surgical risks.  Your role in recovery Your role is to become active as soon  as directed by your doctor, while still giving yourself time to heal.  Rest when you feel tired. You will be asked to do the following in order to speed your recovery:  - Cough and breathe deeply. This helps to clear and expand your lungs and can prevent pneumonia after surgery.  - [place]. Do mild physical activity. Walking or moving your legs help your circulation and body functions return to normal. Do not try to get up or walk alone the first time after surgery.   -If you develop swelling on one leg or the other, pain in the back of your leg, redness/warmth in one of your legs, please call the office or go to the Emergency Room to have a doppler to rule out a blood clot. For shortness of breath, chest pain-seek care in the Emergency Room as soon as possible. - Actively manage your pain. Managing your pain lets you move in comfort. We will ask you to rate your pain on a scale of zero to 10. It is your responsibility to tell your doctor or nurse where and how much you hurt so your pain can be treated.  Special Considerations -If you are diabetic, you may be placed on insulin after surgery to have closer control over your blood sugars to promote healing and recovery.  This does not mean that you will be discharged on insulin.  If applicable, your oral antidiabetics will be resumed when you are tolerating a solid diet.  -Your final pathology results from surgery should be  available around one week after surgery and the results will be relayed to you when available.  -Dr. Lahoma Crocker is the surgeon that assists your GYN Oncologist with surgery.  If you end up staying the night, the next day after your surgery you will either see Dr. Denman George, Dr. Berline Lopes, or Dr. Lahoma Crocker.  -FMLA forms can be faxed to [phone] and please allow 5-7 business days for completion.  -Make sure that you have Tylenol at home to use on a regular basis after surgery for pain control.    Risks of Surgery Risks of surgery are low but include bleeding, infection, damage to surrounding structures, re-operation, blood clots, and very rarely death.   Blood Transfusion Information (For the consent to be signed before surgery)  We will be checking your blood type before surgery so in case of emergencies, we will know what type of blood you would need.                                            WHAT IS A BLOOD TRANSFUSION?  A transfusion is the replacement of blood or some of its parts. Blood is made up of multiple cells which provide different functions.  Red blood cells carry oxygen and are used for blood loss replacement.  White blood cells fight against infection.  Platelets control bleeding.  Plasma helps clot blood.  Other blood products are available for specialized needs, such as hemophilia or other clotting disorders. BEFORE THE TRANSFUSION  Who gives blood for transfusions?   You may be able to donate blood to be used at a later date on yourself (autologous donation).  Relatives can be asked to donate blood. This is generally not any safer than if you have received blood from a stranger. The same precautions are taken to ensure safety when a relative's blood is donated.  Healthy volunteers who are fully evaluated to make sure their blood is safe. This is blood bank blood. Transfusion therapy is the safest it has ever been in the practice of medicine. Before blood is taken from a donor, a complete history is taken to make sure that person has no history of diseases nor engages in risky social behavior (examples are intravenous drug use or sexual activity with multiple partners). The donor's travel history is screened to minimize risk of transmitting infections, such as malaria. The donated blood is tested for signs of infectious diseases, such as HIV and hepatitis. The blood is then tested to be sure it is compatible with you in order to minimize the chance of a  transfusion reaction. If you or a relative donates blood, this is often done in anticipation of surgery and is not appropriate for emergency situations. It takes many days to process the donated blood. RISKS AND COMPLICATIONS Although transfusion therapy is very safe and saves many lives, the main dangers of transfusion include:   Getting an infectious disease.  Developing a transfusion reaction. This is an allergic reaction to something in the blood you were given. Every precaution is taken to prevent this. The decision to have a blood transfusion has been considered carefully by your caregiver before blood is given. Blood is not given unless the benefits outweigh the risks.  AFTER SURGERY INSTRUCTIONS  Return to work: 4-6 weeks if applicable  Activity: 1. Be up and out of the bed during the day.  Take a nap if needed.  You may walk up steps but be careful and use the hand rail.  Stair climbing will tire you more than you think, you may need to stop part way and rest.   2. No lifting or straining for 6 weeks over 10 pounds. No pushing, pulling, straining for 6 weeks.  3. No driving for around 1 week(s).  Do not drive if you are taking narcotic pain medicine and make sure that your reaction time has returned.   4. You can shower as soon as the next day after surgery. Shower daily.  Use soap and water on your incision and pat dry; don't rub.  No tub baths or submerging your body in water until cleared by your surgeon. If you have the soap that was given to you by pre-surgical testing that was used before surgery, you do not need to use it afterwards because this can irritate your incisions.   5. No sexual activity and nothing in the vagina for 8 weeks.  6. You may experience a small amount of clear drainage from your incisions, which is normal.  If the drainage persists, increases, or changes color please call the office.  7. Do not use creams, lotions, or ointments such as neosporin on your  incisions after surgery until advised by your surgeon because they can cause removal of the dermabond glue on your incisions.    8. You may experience vaginal spotting after surgery or around the 6-8 week mark from surgery when the stitches at the top of the vagina begin to dissolve.  The spotting is normal but if you experience heavy bleeding, call our office.  9. Take Tylenol or ibuprofen first for pain and only use narcotic pain medication for severe pain not relieved by the Tylenol or Ibuprofen.  Monitor your Tylenol intake to a max of 4,000 mg.  Diet: 1. Low sodium Heart Healthy Diet is recommended.  2. It is safe to use a laxative, such as Miralax or Colace, if you have difficulty moving your bowels. You can take Sennakot at bedtime every evening to keep bowel movements regular and to prevent constipation.    Wound Care: 1. Keep clean and dry.  Shower daily.  Reasons to call the Doctor:  Fever - Oral temperature greater than 100.4 degrees Fahrenheit  Foul-smelling vaginal discharge  Difficulty urinating  Nausea and vomiting  Increased pain at the site of the incision that is unrelieved with pain medicine.  Difficulty breathing with or without chest pain  New calf pain especially if only on one side  Sudden, continuing increased vaginal bleeding with or without clots.   Contacts: For questions or concerns you should contact:  Dr. Jeral Pinch at [phone]  Joylene John, NP at [phone]  After Hours: call [phone] and have the GYN Oncologist paged/contacted

## 2019-06-18 ENCOUNTER — Other Ambulatory Visit: Payer: Self-pay | Admitting: Gynecologic Oncology

## 2019-06-18 DIAGNOSIS — C562 Malignant neoplasm of left ovary: Secondary | ICD-10-CM

## 2019-06-18 NOTE — Progress Notes (Signed)
Pharmacist Chemotherapy Monitoring - Follow Up Assessment    I verify that I have reviewed each item in the below checklist:  . Regimen for the patient is scheduled for the appropriate day and plan matches scheduled date. Marland Kitchen Appropriate non-routine labs are ordered dependent on drug ordered. . If applicable, additional medications reviewed and ordered per protocol based on lifetime cumulative doses and/or treatment regimen.   Plan for follow-up and/or issues identified: No . I-vent associated with next due treatment: No . MD and/or nursing notified: No  Ebb Carelock D 06/18/2019 11:37 AM

## 2019-06-24 ENCOUNTER — Inpatient Hospital Stay: Payer: Medicare HMO

## 2019-06-24 ENCOUNTER — Other Ambulatory Visit: Payer: Self-pay | Admitting: Hematology and Oncology

## 2019-06-24 ENCOUNTER — Other Ambulatory Visit: Payer: Self-pay

## 2019-06-24 ENCOUNTER — Encounter: Payer: Self-pay | Admitting: Hematology and Oncology

## 2019-06-24 ENCOUNTER — Inpatient Hospital Stay: Payer: Medicare HMO | Admitting: Hematology and Oncology

## 2019-06-24 DIAGNOSIS — R971 Elevated cancer antigen 125 [CA 125]: Secondary | ICD-10-CM

## 2019-06-24 DIAGNOSIS — R739 Hyperglycemia, unspecified: Secondary | ICD-10-CM | POA: Diagnosis not present

## 2019-06-24 DIAGNOSIS — T451X5A Adverse effect of antineoplastic and immunosuppressive drugs, initial encounter: Secondary | ICD-10-CM | POA: Diagnosis not present

## 2019-06-24 DIAGNOSIS — G620 Drug-induced polyneuropathy: Secondary | ICD-10-CM

## 2019-06-24 DIAGNOSIS — C800 Disseminated malignant neoplasm, unspecified: Secondary | ICD-10-CM

## 2019-06-24 DIAGNOSIS — Z7189 Other specified counseling: Secondary | ICD-10-CM

## 2019-06-24 DIAGNOSIS — D61818 Other pancytopenia: Secondary | ICD-10-CM | POA: Diagnosis not present

## 2019-06-24 DIAGNOSIS — E785 Hyperlipidemia, unspecified: Secondary | ICD-10-CM | POA: Diagnosis not present

## 2019-06-24 DIAGNOSIS — C562 Malignant neoplasm of left ovary: Secondary | ICD-10-CM

## 2019-06-24 DIAGNOSIS — I480 Paroxysmal atrial fibrillation: Secondary | ICD-10-CM | POA: Diagnosis not present

## 2019-06-24 DIAGNOSIS — T380X5A Adverse effect of glucocorticoids and synthetic analogues, initial encounter: Secondary | ICD-10-CM | POA: Diagnosis not present

## 2019-06-24 DIAGNOSIS — K1231 Oral mucositis (ulcerative) due to antineoplastic therapy: Secondary | ICD-10-CM | POA: Diagnosis not present

## 2019-06-24 DIAGNOSIS — Z5111 Encounter for antineoplastic chemotherapy: Secondary | ICD-10-CM | POA: Diagnosis not present

## 2019-06-24 DIAGNOSIS — C786 Secondary malignant neoplasm of retroperitoneum and peritoneum: Secondary | ICD-10-CM | POA: Diagnosis not present

## 2019-06-24 LAB — CBC WITH DIFFERENTIAL (CANCER CENTER ONLY)
Abs Immature Granulocytes: 0.07 10*3/uL (ref 0.00–0.07)
Basophils Absolute: 0 10*3/uL (ref 0.0–0.1)
Basophils Relative: 0 %
Eosinophils Absolute: 0 10*3/uL (ref 0.0–0.5)
Eosinophils Relative: 0 %
HCT: 33.4 % — ABNORMAL LOW (ref 36.0–46.0)
Hemoglobin: 10.8 g/dL — ABNORMAL LOW (ref 12.0–15.0)
Immature Granulocytes: 1 %
Lymphocytes Relative: 14 %
Lymphs Abs: 1 10*3/uL (ref 0.7–4.0)
MCH: 28.7 pg (ref 26.0–34.0)
MCHC: 32.3 g/dL (ref 30.0–36.0)
MCV: 88.8 fL (ref 80.0–100.0)
Monocytes Absolute: 0.1 10*3/uL (ref 0.1–1.0)
Monocytes Relative: 1 %
Neutro Abs: 5.9 10*3/uL (ref 1.7–7.7)
Neutrophils Relative %: 84 %
Platelet Count: 244 10*3/uL (ref 150–400)
RBC: 3.76 MIL/uL — ABNORMAL LOW (ref 3.87–5.11)
RDW: 18 % — ABNORMAL HIGH (ref 11.5–15.5)
WBC Count: 7.1 10*3/uL (ref 4.0–10.5)
nRBC: 0 % (ref 0.0–0.2)

## 2019-06-24 LAB — CMP (CANCER CENTER ONLY)
ALT: 17 U/L (ref 0–44)
AST: 21 U/L (ref 15–41)
Albumin: 3.1 g/dL — ABNORMAL LOW (ref 3.5–5.0)
Alkaline Phosphatase: 65 U/L (ref 38–126)
Anion gap: 16 — ABNORMAL HIGH (ref 5–15)
BUN: 14 mg/dL (ref 8–23)
CO2: 18 mmol/L — ABNORMAL LOW (ref 22–32)
Calcium: 9.2 mg/dL (ref 8.9–10.3)
Chloride: 105 mmol/L (ref 98–111)
Creatinine: 0.93 mg/dL (ref 0.44–1.00)
GFR, Est AFR Am: >60 mL/min (ref >60)
GFR, Estimated: 57 mL/min — ABNORMAL LOW (ref >60)
Glucose, Bld: 266 mg/dL — ABNORMAL HIGH (ref 70–99)
Potassium: 4.2 mmol/L (ref 3.5–5.1)
Sodium: 139 mmol/L (ref 135–145)
Total Bilirubin: <0.2 mg/dL — ABNORMAL LOW (ref 0.3–1.2)
Total Protein: 7.7 g/dL (ref 6.5–8.1)

## 2019-06-24 MED ORDER — DEXAMETHASONE 4 MG PO TABS: ORAL_TABLET | ORAL | 6 refills | Status: DC

## 2019-06-24 MED ORDER — SODIUM CHLORIDE 0.9 % IV SOLN
131.2500 mg/m2 | Freq: Once | INTRAVENOUS | Status: AC
  Administered 2019-06-24: 210 mg via INTRAVENOUS
  Filled 2019-06-24: qty 35

## 2019-06-24 MED ORDER — DIPHENHYDRAMINE HCL 50 MG/ML IJ SOLN
25.0000 mg | Freq: Once | INTRAMUSCULAR | Status: AC
  Administered 2019-06-24: 25 mg via INTRAVENOUS

## 2019-06-24 MED ORDER — DIPHENHYDRAMINE HCL 50 MG/ML IJ SOLN
INTRAMUSCULAR | Status: AC
  Filled 2019-06-24: qty 1

## 2019-06-24 MED ORDER — SODIUM CHLORIDE 0.9 % IV SOLN
10.0000 mg | Freq: Once | INTRAVENOUS | Status: AC
  Administered 2019-06-24: 10 mg via INTRAVENOUS
  Filled 2019-06-24: qty 10

## 2019-06-24 MED ORDER — SODIUM CHLORIDE 0.9 % IV SOLN
150.0000 mg | Freq: Once | INTRAVENOUS | Status: AC
  Administered 2019-06-24: 150 mg via INTRAVENOUS
  Filled 2019-06-24: qty 150

## 2019-06-24 MED ORDER — FAMOTIDINE IN NACL 20-0.9 MG/50ML-% IV SOLN
INTRAVENOUS | Status: AC
  Filled 2019-06-24: qty 50

## 2019-06-24 MED ORDER — SODIUM CHLORIDE 0.9 % IV SOLN
Freq: Once | INTRAVENOUS | Status: AC
  Filled 2019-06-24: qty 250

## 2019-06-24 MED ORDER — PALONOSETRON HCL INJECTION 0.25 MG/5ML
INTRAVENOUS | Status: AC
  Filled 2019-06-24: qty 5

## 2019-06-24 MED ORDER — SODIUM CHLORIDE 0.9 % IV SOLN
380.0000 mg | Freq: Once | INTRAVENOUS | Status: AC
  Administered 2019-06-24: 380 mg via INTRAVENOUS
  Filled 2019-06-24: qty 38

## 2019-06-24 MED ORDER — FAMOTIDINE IN NACL 20-0.9 MG/50ML-% IV SOLN
20.0000 mg | Freq: Once | INTRAVENOUS | Status: AC
  Administered 2019-06-24: 20 mg via INTRAVENOUS

## 2019-06-24 MED ORDER — PALONOSETRON HCL INJECTION 0.25 MG/5ML
0.2500 mg | Freq: Once | INTRAVENOUS | Status: AC
  Administered 2019-06-24: 0.25 mg via INTRAVENOUS

## 2019-06-24 NOTE — Progress Notes (Signed)
[facility] OFFICE PROGRESS NOTE  Patient Care Team: Ernestene Kiel, MD as PCP - General (Internal Medicine) Berniece Salines, DO as PCP - Cardiology (Cardiology) Awanda Mink Craige Cotta, RN as Oncology Nurse Navigator (Oncology) Lafonda Mosses, MD as Consulting Physician (Gynecologic Oncology)  ASSESSMENT & PLAN:  Ovarian cancer, left [facility]) I reviewed CT imaging with the patient and her daughter She is pleasantly surprised to see excellent response to therapy We will proceed with cycle 4 of chemotherapy as discussed with her GYN surgeon, with plan for her to proceed with interval debulking surgery next month I will see her within a few weeks after her surgery to resume chemotherapy  Steroid-induced hyperglycemia Her blood sugar is elevated secondary to steroid treatment She will receive some insulin today  Peripheral neuropathy due to chemotherapy [facility]) she has mild peripheral neuropathy, likely related to side effects of treatment. I plan to reduce the dose of taxol a bit  Mucositis due to antineoplastic therapy She had a small blister in her mouth consistent with mucositis but no major ulceration I recommend baking soda mixed with salt water gargle  Pancytopenia, acquired ([place]) This is likely due to recent treatment. The patient denies recent history of bleeding such as epistaxis, hematuria or hematochezia. She is asymptomatic from the anemia. I will observe for now.   No orders of the defined types were placed in this encounter.   All questions were answered. The patient knows to call the clinic with any problems, questions or concerns. The total time spent in the appointment was 30 minutes encounter with patients including review of chart and various tests results, discussions about plan of care and coordination of care plan   Heath Lark, MD 06/24/2019 11:05 AM  INTERVAL HISTORY: Please see below for problem oriented charting. She returns with her daughter for  further follow-up She is doing well overall from chemotherapy side effects She have not lost any weight She has minimum abdominal pain No recent nausea or constipation She is developing persistent grade 2 neuropathy in her hands and feet She also developed some mucositis with the blister in the buccal mucosa No fever or chills The patient denies any recent signs or symptoms of bleeding such as spontaneous epistaxis, hematuria or hematochezia.  SUMMARY OF ONCOLOGIC HISTORY: Oncology History  Carcinomatosis ([place])  04/03/2019 Initial Diagnosis   Carcinomatosis ([place])   Ovarian cancer, left ([place])  02/07/2019 Imaging   Outside CT chest showed no evidence of metastatic disease   03/20/2019 Imaging   Outside Ct abdomen and pelvis showed diffuse carcinomatosis, left adnexa mass and ascites   04/11/2019 Pathology Results   FINAL MICROSCOPIC DIAGNOSIS:   A. OMENTUM, LEFT ABDOMINAL, NEEDLE CORE BIOPSY:  - Metastatic carcinoma.  See comment    COMMENT:   Immunohistochemical stains show that the tumor cells are positive for PAX8, ER, WT1, CK7 (focal) and CK 5/6 (patchy); and negative for p63, calretinin, D2-40, CK20, GATA3 and CDX2.  This mmunohistochemical profile is consistent with a gynecologic primary and suggestive of a low-grade serous carcinoma.   04/11/2019 Procedure   Image guided core biopsy of the omental disease. In addition, small amount of ascites was collected for cytology   04/18/2019 Cancer Staging   Staging form: Ovary, Fallopian Tube, and Primary Peritoneal Carcinoma, AJCC 8th Edition - Clinical stage from 04/18/2019: FIGO Stage IIIC (cT3c, cN0, cM0) - Signed by Heath Lark, MD on 04/18/2019   04/18/2019 Tumor Marker   Patient's tumor was tested for the following markers: CA-125  Results of the tumor marker test revealed 402   04/22/2019 -  Chemotherapy   The patient had carboplatin and taxol for chemotherapy treatment.     05/13/2019 Tumor Marker   Patient's tumor was tested  for the following markers: CA-125 Results of the tumor marker test revealed 146   06/12/2019 Imaging   2.6 cm left adnexal mass in this patient with known ovarian cancer, previously 3.3 cm.   Improving peritoneal disease/omental caking, as above.   No abdominopelvic ascites.     REVIEW OF SYSTEMS:   Constitutional: Denies fevers, chills or abnormal weight loss Eyes: Denies blurriness of vision Ears, nRespiratory: Denies cough, dyspnea or wheezes Cardiovascular: Denies palpitation, chest discomfort or lower extremity swelling Gastrointestinal:  Denies nausea, heartburn or change in bowel habits Skin: Denies abnormal skin rashes Lymphatics: Denies new lymphadenopathy or easy bruising Behavioral/Psych: Mood is stable, no new changes  All other systems were reviewed with the patient and are negative.  I have reviewed the past medical history, past surgical history, social history and family history with the patient and they are unchanged from previous note.  ALLERGIES:  is allergic to codeine sulfate [codeine]; tetanus toxoids; horse-derived products; and sulfa antibiotics.  MEDICATIONS:  Current Outpatient Medications  Medication Sig Dispense Refill  . dexamethasone (DECADRON) 4 MG tablet Take 2 tabs at the night before and 2 tab the morning of chemotherapy, every 3 weeks, by mouth 24 tablet 6  . diltiazem (CARDIZEM) 120 MG tablet Take 1 tablet (120 mg total) by mouth daily. 90 tablet 3  . ELIQUIS 5 MG TABS tablet Take 5 mg by mouth 2 (two) times daily.    . metoprolol succinate (TOPROL XL) 25 MG 24 hr tablet Take 1.5 tablets (37.5 mg total) by mouth daily. 135 tablet 1  . omeprazole (PRILOSEC) 20 MG capsule Take 20 mg by mouth 2 (two) times daily before a meal.     . ondansetron (ZOFRAN) 8 MG tablet Take 1 tablet (8 mg total) by mouth every 8 (eight) hours as needed for refractory nausea / vomiting. 30 tablet 1  . oxyCODONE (OXY IR/ROXICODONE) 5 MG immediate release tablet Take 1  tablet (5 mg total) by mouth every 4 (four) hours as needed for severe pain. 30 tablet 0  . prochlorperazine (COMPAZINE) 10 MG tablet Take 1 tablet (10 mg total) by mouth every 6 (six) hours as needed (Nausea or vomiting). 30 tablet 1  . triamcinolone cream (KENALOG) 0.5 % Apply 1 application topically 3 (three) times daily. 90 g 1   No current facility-administered medications for this visit.   Facility-Administered Medications Ordered in Other Visits  Medication Dose Route Frequency Provider Last Rate Last Admin  . CARBOplatin (PARAPLATIN) 380 mg in sodium chloride 0.9 % 250 mL chemo infusion  380 mg Intravenous Once Alvy Bimler, Rayen Dafoe, MD      . fosaprepitant (EMEND) 150 mg in sodium chloride 0.9 % 145 mL IVPB  150 mg Intravenous Once Alvy Bimler, Keland Peyton, MD      . PACLitaxel (TAXOL) 210 mg in sodium chloride 0.9 % 250 mL chemo infusion (> 80mg /m2)  131.25 mg/m2 (Treatment Plan Recorded) Intravenous Once Alvy Bimler, Mallissa Lorenzen, MD        PHYSICAL EXAMINATION: ECOG PERFORMANCE STATUS: 2 - Symptomatic, <50% confined to bed  Vitals:   06/24/19 0929  BP: 133/70  Pulse: 95  Resp: 18  Temp: 98.2 F (36.8 C)  SpO2: 98%   Filed Weights   06/24/19 0929  Weight: 123 lb (55.8 kg)  GENERAL:alert, no distress and comfortable SKIN: skin color, texture, turgor are normal, no rashes or significant lesions EYES: normal, Conjunctiva are pink and non-injected, sclera clear OROPHARYNX: Noted a small blister in the buccal mucosa on the left NECK: supple, thyroid normal size, non-tender, without nodularity LYMPH:  no palpable lymphadenopathy in the cervical, axillary or inguinal LUNGS: clear to auscultation and percussion with normal breathing effort HEART: regular rate & rhythm and no murmurs and no lower extremity edema ABDOMEN:abdomen soft, non-tender and normal bowel sounds Musculoskeletal:no cyanosis of digits and no clubbing  NEURO: alert & oriented x 3 with fluent speech, no focal motor/sensory  deficits  LABORATORY DATA:  I have reviewed the data as listed    Component Value Date/Time   NA 139 06/24/2019 0859   NA 137 03/04/2019 1107   K 4.2 06/24/2019 0859   CL 105 06/24/2019 0859   CO2 18 (L) 06/24/2019 0859   GLUCOSE 266 (H) 06/24/2019 0859   BUN 14 06/24/2019 0859   BUN 11 03/04/2019 1107   CREATININE 0.93 06/24/2019 0859   CALCIUM 9.2 06/24/2019 0859   PROT 7.7 06/24/2019 0859   PROT 7.4 03/04/2019 1107   ALBUMIN 3.1 (L) 06/24/2019 0859   ALBUMIN 3.6 03/04/2019 1107   AST 21 06/24/2019 0859   ALT 17 06/24/2019 0859   ALKPHOS 65 06/24/2019 0859   BILITOT <0.2 (L) 06/24/2019 0859   GFRNONAA 57 (L) 06/24/2019 0859   GFRAA >60 06/24/2019 0859    No results found for: SPEP, UPEP  Lab Results  Component Value Date   WBC 7.1 06/24/2019   NEUTROABS 5.9 06/24/2019   HGB 10.8 (L) 06/24/2019   HCT 33.4 (L) 06/24/2019   MCV 88.8 06/24/2019   PLT 244 06/24/2019      Chemistry      Component Value Date/Time   NA 139 06/24/2019 0859   NA 137 03/04/2019 1107   K 4.2 06/24/2019 0859   CL 105 06/24/2019 0859   CO2 18 (L) 06/24/2019 0859   BUN 14 06/24/2019 0859   BUN 11 03/04/2019 1107   CREATININE 0.93 06/24/2019 0859      Component Value Date/Time   CALCIUM 9.2 06/24/2019 0859   ALKPHOS 65 06/24/2019 0859   AST 21 06/24/2019 0859   ALT 17 06/24/2019 0859   BILITOT <0.2 (L) 06/24/2019 0859       RADIOGRAPHIC STUDIES: I have reviewed multiple imaging studies with the patient and her daughter I have personally reviewed the radiological images as listed and agreed with the findings in the report. CT ABDOMEN PELVIS W CONTRAST  Result Date: 06/12/2019 CLINICAL DATA:  Ovarian cancer, on chemotherapy and considering XRT, assess treatment response EXAM: CT ABDOMEN AND PELVIS WITH CONTRAST TECHNIQUE: Multidetector CT imaging of the abdomen and pelvis was performed using the standard protocol following bolus administration of intravenous contrast. CONTRAST:  12mL  OMNIPAQUE IOHEXOL 300 MG/ML  SOLN COMPARISON:  03/20/2019 FINDINGS: Lower chest: Lung bases are clear. Hepatobiliary: Liver is within normal limits. Layering small gallstone (series 2/image 27), without associated inflammatory changes. No intrahepatic or extrahepatic ductal dilatation. Pancreas: Within normal limits. Spleen: Within normal limits. Adrenals/Urinary Tract: Adrenal glands are within normal limits. Kidneys are within normal limits.  No hydronephrosis. Bladder is within normal limits. Stomach/Bowel: Stomach is within normal limits. No evidence of bowel obstruction. Normal appendix (series 2/image 55). Mild sigmoid diverticulosis, without evidence of diverticulitis. Vascular/Lymphatic: No evidence of abdominal aortic aneurysm. Atherosclerotic calcifications of the abdominal aorta and branch vessels. No suspicious  abdominopelvic lymphadenopathy. Reproductive: Uterus and right ovary are within normal limits. Mildly complex left adnexal mass measures 2.1 x 2.6 cm, previously 3.1 x 3.3 cm, likely corresponding to the patient's reported ovarian cancer. Other: No abdominopelvic ascites. Peritoneal nodularity/omental caking, including a dominant 2.7 x 8.7 cm implant beneath the left mid anterior abdominal wall (series 2/image 47), previously 4.8 x 11.4 cm when measured in a similar fashion. Musculoskeletal: Mild degenerative changes of the visualized thoracolumbar spine. IMPRESSION: 2.6 cm left adnexal mass in this patient with known ovarian cancer, previously 3.3 cm. Improving peritoneal disease/omental caking, as above. No abdominopelvic ascites. Electronically Signed   By: Julian Hy M.D.   On: 06/12/2019 13:53

## 2019-06-24 NOTE — Assessment & Plan Note (Signed)
I reviewed CT imaging with the patient and her daughter She is pleasantly surprised to see excellent response to therapy We will proceed with cycle 4 of chemotherapy as discussed with her GYN surgeon, with plan for her to proceed with interval debulking surgery next month I will see her within a few weeks after her surgery to resume chemotherapy

## 2019-06-24 NOTE — Assessment & Plan Note (Signed)
She had a small blister in her mouth consistent with mucositis but no major ulceration I recommend baking soda mixed with salt water gargle

## 2019-06-24 NOTE — Assessment & Plan Note (Signed)
This is likely due to recent treatment. The patient denies recent history of bleeding such as epistaxis, hematuria or hematochezia. She is asymptomatic from the anemia. I will observe for now.   

## 2019-06-24 NOTE — Patient Instructions (Signed)
[facility] Discharge Instructions for Patients Receiving Chemotherapy  Today you received the following chemotherapy agents Taxol, Carboplatin  To help prevent nausea and vomiting after your treatment, we encourage you to take your nausea medication as directed  If you develop nausea and vomiting that is not controlled by your nausea medication, call the clinic.   BELOW ARE SYMPTOMS THAT SHOULD BE REPORTED IMMEDIATELY:  *FEVER GREATER THAN 100.5 F  *CHILLS WITH OR WITHOUT FEVER  NAUSEA AND VOMITING THAT IS NOT CONTROLLED WITH YOUR NAUSEA MEDICATION  *UNUSUAL SHORTNESS OF BREATH  *UNUSUAL BRUISING OR BLEEDING  TENDERNESS IN MOUTH AND THROAT WITH OR WITHOUT PRESENCE OF ULCERS  *URINARY PROBLEMS  *BOWEL PROBLEMS  UNUSUAL RASH Items with * indicate a potential emergency and should be followed up as soon as possible.  Feel free to call the clinic should you have any questions or concerns. The clinic phone number is [phone].  Please show the CHEMO ALERT CARD at check-in to the Emergency Department and triage nurse.   

## 2019-06-24 NOTE — Progress Notes (Signed)
Patient to receive insulin 10 units for elevated blood glucose today per Dr. Alvy Bimler.  Demetrius Charity, PharmD, BCPS, [place] [facility] Pharmacist Pharmacy Phone: [phone] 06/24/2019

## 2019-06-24 NOTE — Assessment & Plan Note (Signed)
Her blood sugar is elevated secondary to steroid treatment She will receive some insulin today

## 2019-06-24 NOTE — Assessment & Plan Note (Signed)
she has mild peripheral neuropathy, likely related to side effects of treatment. I plan to reduce the dose of taxol a bit

## 2019-06-25 ENCOUNTER — Telehealth: Payer: Self-pay

## 2019-06-25 LAB — CA 125: Cancer Antigen (CA) 125: 16.9 U/mL (ref 0.0–38.1)

## 2019-06-25 NOTE — Telephone Encounter (Signed)
-----   Message from Heath Lark, MD sent at 06/25/2019  9:02 AM EDT ----- Regarding: tell her CA-125 is 16.8, normal

## 2019-06-25 NOTE — Telephone Encounter (Signed)
Called and left below message. Ask her to call back for questions. 

## 2019-07-15 NOTE — Patient Instructions (Addendum)
DUE TO COVID-19 ONLY ONE VISITOR IS ALLOWED TO COME WITH YOU AND STAY IN THE WAITING ROOM ONLY DURING PRE OP AND PROCEDURE DAY OF SURGERY. TWO VISITOR MAY VISIT WITH YOU AFTER SURGERY IN YOUR PRIVATE ROOM DURING VISITING HOURS ONLY! 10a-8p  YOU NEED TO HAVE A COVID 19 TEST ON__6-11-21_____ @_10 :00______, THIS TEST MUST BE DONE BEFORE SURGERY, COME  [address], [place]  , [id number].  ([place]) ONCE YOUR COVID TEST IS COMPLETED, PLEASE BEGIN THE QUARANTINE INSTRUCTIONS AS OUTLINED IN YOUR HANDOUT.                [facility]  07/15/2019   Your procedure is scheduled on: 07-23-19   Report to [facility] Main  Entrance   Report to admitting at      1135 AM     Call this number if you have problems the morning of surgery [phone]    Remember: Eat a light diet the day before surgery.  Examples including soups, broths, toast, yogurt, mashed potatoes.  Things to avoid include carbonated beverages (fizzy beverages), raw fruits and raw vegetables, or beans. At midnight switch to clear liquids until 1030 am then nothing by mouth  If your bowels are filled with gas, your surgeon will have difficulty visualizing your pelvic organs which increases your surgical risks.    CLEAR LIQUID DIET   Foods Allowed                                                                                         Foods Excluded  Coffee and tea, regular and decaf NO CREAMER                             liquids that you cannot  Plain Jell-O any favor except red or purple                                           see through such as: Fruit ices (not with fruit pulp)                                                               milk, soups, orange juice  Iced Popsicles                                                               All solid food                                 Cranberry,  grape and apple juices Sports drinks like Gatorade Lightly seasoned clear broth or consume(fat  free) Sugar, honey syrup  _____________________________________________________________________    BRUSH YOUR TEETH MORNING OF SURGERY AND RINSE YOUR MOUTH OUT, NO CHEWING GUM CANDY OR MINTS.     Take these medicines the morning of surgery with A SIP OF WATER: omeprazole, dilitiazem                               You may not have any metal on your body including hair pins and              piercings  Do not wear jewelry, make-up, lotions, powders or perfumes, deodorant             Do not wear nail polish on your fingernails.  Do not shave  48 hours prior to surgery.           Do not bring valuables to the hospital. [place].  Contacts, dentures or bridgework may not be worn into surgery.       Patients discharged the day of surgery will not be allowed to drive home. IF YOU ARE HAVING SURGERY AND GOING HOME THE SAME DAY, YOU MUST HAVE AN ADULT TO DRIVE YOU HOME AND BE WITH YOU FOR 24 HOURS. YOU MAY GO HOME BY TAXI OR UBER OR ORTHERWISE, BUT AN ADULT MUST ACCOMPANY YOU HOME AND STAY WITH YOU FOR 24 HOURS.  Name and phone number of your driver:  Special Instructions: N/A              Please read over the following fact sheets you were given: _____________________________________________________________________             [facility] - Preparing for Surgery Before surgery, you can play an important role.  Because skin is not sterile, your skin needs to be as free of germs as possible.  You can reduce the number of germs on your skin by washing with CHG (chlorahexidine gluconate) soap before surgery.  CHG is an antiseptic cleaner which kills germs and bonds with the skin to continue killing germs even after washing. Please DO NOT use if you have an allergy to CHG or antibacterial soaps.  If your skin becomes reddened/irritated stop using the CHG and inform your nurse when you arrive at Short Stay. Do not shave (including legs and  underarms) for at least 48 hours prior to the first CHG shower.  You may shave your face/neck. Please follow these instructions carefully:  1.  Shower with CHG Soap the night before surgery and the  morning of Surgery.  2.  If you choose to wash your hair, wash your hair first as usual with your  normal  shampoo.  3.  After you shampoo, rinse your hair and body thoroughly to remove the  shampoo.                           4.  Use CHG as you would any other liquid soap.  You can apply chg directly  to the skin and wash                       Gently with a scrungie or clean washcloth.  5.  Apply the CHG  Soap to your body ONLY FROM THE NECK DOWN.   Do not use on face/ open                           Wound or open sores. Avoid contact with eyes, ears mouth and genitals (private parts).                       Wash face,  Genitals (private parts) with your normal soap.             6.  Wash thoroughly, paying special attention to the area where your surgery  will be performed.  7.  Thoroughly rinse your body with warm water from the neck down.  8.  DO NOT shower/wash with your normal soap after using and rinsing off  the CHG Soap.                9.  Pat yourself dry with a clean towel.            10.  Wear clean pajamas.            11.  Place clean sheets on your bed the night of your first shower and do not  sleep with pets. Day of Surgery : Do not apply any lotions/deodorants the morning of surgery.  Please wear clean clothes to the hospital/surgery center.  FAILURE TO FOLLOW THESE INSTRUCTIONS MAY RESULT IN THE CANCELLATION OF YOUR SURGERY PATIENT SIGNATURE_________________________________  NURSE SIGNATURE__________________________________  ________________________________________________________________________   Adam Phenix  An incentive spirometer is a tool that can help keep your lungs clear and active. This tool measures how well you are filling your lungs with each breath. Taking  long deep breaths may help reverse or decrease the chance of developing breathing (pulmonary) problems (especially infection) following:  A long period of time when you are unable to move or be active. BEFORE THE PROCEDURE   If the spirometer includes an indicator to show your best effort, your nurse or respiratory therapist will set it to a desired goal.  If possible, sit up straight or lean slightly forward. Try not to slouch.  Hold the incentive spirometer in an upright position. INSTRUCTIONS FOR USE  1. Sit on the edge of your bed if possible, or sit up as far as you can in bed or on a chair. 2. Hold the incentive spirometer in an upright position. 3. Breathe out normally. 4. Place the mouthpiece in your mouth and seal your lips tightly around it. 5. Breathe in slowly and as deeply as possible, raising the piston or the ball toward the top of the column. 6. Hold your breath for 3-5 seconds or for as long as possible. Allow the piston or ball to fall to the bottom of the column. 7. Remove the mouthpiece from your mouth and breathe out normally. 8. Rest for a few seconds and repeat Steps 1 through 7 at least 10 times every 1-2 hours when you are awake. Take your time and take a few normal breaths between deep breaths. 9. The spirometer may include an indicator to show your best effort. Use the indicator as a goal to work toward during each repetition. 10. After each set of 10 deep breaths, practice coughing to be sure your lungs are clear. If you have an incision (the cut made at the time of surgery), support your incision when coughing by placing a pillow or rolled up towels  firmly against it. Once you are able to get out of bed, walk around indoors and cough well. You may stop using the incentive spirometer when instructed by your caregiver.  RISKS AND COMPLICATIONS  Take your time so you do not get dizzy or light-headed.  If you are in pain, you may need to take or ask for pain  medication before doing incentive spirometry. It is harder to take a deep breath if you are having pain. AFTER USE  Rest and breathe slowly and easily.  It can be helpful to keep track of a log of your progress. Your caregiver can provide you with a simple table to help with this. If you are using the spirometer at home, follow these instructions: [place] IF:   You are having difficultly using the spirometer.  You have trouble using the spirometer as often as instructed.  Your pain medication is not giving enough relief while using the spirometer.  You develop fever of 100.5 F (38.1 C) or higher. SEEK IMMEDIATE MEDICAL CARE IF:   You cough up bloody sputum that had not been present before.  You develop fever of 102 F (38.9 C) or greater.  You develop worsening pain at or near the incision site. MAKE SURE YOU:   Understand these instructions.  Will watch your condition.  Will get help right away if you are not doing well or get worse. Document Released: 06/06/2006 Document Revised: 04/18/2011 Document Reviewed: 08/07/2006 [facility] Patient Information 2014 [place], [place].   ________________________________________________________________________

## 2019-07-15 NOTE — Progress Notes (Signed)
PCP - Ernestene Kiel Cardiologist - Berniece Salines  Clearance on chart 06-05-19   PPM/ICD -  Device Orders -  Rep Notified -   Chest x-ray -  EKG - 03-04-19 epic Stress Test - 03-06-19 epic ECHO - 02-08-19 epic Cardiac Cath -   Sleep Study -  CPAP -   Fasting Blood Sugar -  Checks Blood Sugar _____ times a day  Blood Thinner Instructions:  elequis  Hold 4 doses prior to procedure Aspirin Instructions:  ERAS Protcol - PRE-SURGERY Ensure or G2-   COVID TEST-    Anesthesia review: a fib,   Patient denies shortness of breath, fever, cough and chest pain at PAT appointment   All instructions explained to the patient, with a verbal understanding of the material. Patient agrees to go over the instructions while at home for a better understanding. Patient also instructed to self quarantine after being tested for COVID-19. The opportunity to ask questions was provided.

## 2019-07-16 ENCOUNTER — Other Ambulatory Visit: Payer: Self-pay

## 2019-07-16 ENCOUNTER — Encounter (HOSPITAL_COMMUNITY)
Admission: RE | Admit: 2019-07-16 | Discharge: 2019-07-16 | Disposition: A | Discharge status: Home / Self Care | Payer: Medicare HMO | Source: Ambulatory Visit | Attending: Gynecologic Oncology | Admitting: Gynecologic Oncology

## 2019-07-16 ENCOUNTER — Encounter (HOSPITAL_COMMUNITY): Payer: Self-pay

## 2019-07-16 DIAGNOSIS — Z01812 Encounter for preprocedural laboratory examination: Secondary | ICD-10-CM | POA: Insufficient documentation

## 2019-07-16 HISTORY — DX: Cardiac arrhythmia, unspecified: I49.9

## 2019-07-16 HISTORY — DX: Headache, unspecified: R51.9

## 2019-07-16 LAB — CBC WITH DIFFERENTIAL/PLATELET
Abs Immature Granulocytes: 0.04 10*3/uL (ref 0.00–0.07)
Basophils Absolute: 0 10*3/uL (ref 0.0–0.1)
Basophils Relative: 1 %
Eosinophils Absolute: 0.1 10*3/uL (ref 0.0–0.5)
Eosinophils Relative: 1 %
HCT: 32.9 % — ABNORMAL LOW (ref 36.0–46.0)
Hemoglobin: 10.3 g/dL — ABNORMAL LOW (ref 12.0–15.0)
Immature Granulocytes: 1 %
Lymphocytes Relative: 23 %
Lymphs Abs: 1.7 10*3/uL (ref 0.7–4.0)
MCH: 29.8 pg (ref 26.0–34.0)
MCHC: 31.3 g/dL (ref 30.0–36.0)
MCV: 95.1 fL (ref 80.0–100.0)
Monocytes Absolute: 0.9 10*3/uL (ref 0.1–1.0)
Monocytes Relative: 12 %
Neutro Abs: 4.5 10*3/uL (ref 1.7–7.7)
Neutrophils Relative %: 62 %
Platelets: 190 10*3/uL (ref 150–400)
RBC: 3.46 MIL/uL — ABNORMAL LOW (ref 3.87–5.11)
RDW: 19.1 % — ABNORMAL HIGH (ref 11.5–15.5)
WBC: 7.3 10*3/uL (ref 4.0–10.5)
nRBC: 0 % (ref 0.0–0.2)

## 2019-07-16 LAB — COMPREHENSIVE METABOLIC PANEL
ALT: 16 U/L (ref 0–44)
AST: 26 U/L (ref 15–41)
Albumin: 3.7 g/dL (ref 3.5–5.0)
Alkaline Phosphatase: 61 U/L (ref 38–126)
Anion gap: 10 (ref 5–15)
BUN: 17 mg/dL (ref 8–23)
CO2: 24 mmol/L (ref 22–32)
Calcium: 8.9 mg/dL (ref 8.9–10.3)
Chloride: 104 mmol/L (ref 98–111)
Creatinine, Ser: 0.73 mg/dL (ref 0.44–1.00)
GFR calc Af Amer: >60 mL/min (ref >60)
GFR calc non Af Amer: >60 mL/min (ref >60)
Glucose, Bld: 102 mg/dL — ABNORMAL HIGH (ref 70–99)
Potassium: 4 mmol/L (ref 3.5–5.1)
Sodium: 138 mmol/L (ref 135–145)
Total Bilirubin: 0.3 mg/dL (ref 0.3–1.2)
Total Protein: 7.8 g/dL (ref 6.5–8.1)

## 2019-07-16 LAB — URINALYSIS, ROUTINE W REFLEX MICROSCOPIC
Bilirubin Urine: NEGATIVE
Glucose, UA: NEGATIVE mg/dL
Hgb urine dipstick: NEGATIVE
Ketones, ur: NEGATIVE mg/dL
Leukocytes,Ua: NEGATIVE
Nitrite: NEGATIVE
Protein, ur: NEGATIVE mg/dL
Specific Gravity, Urine: 1.014 (ref 1.005–1.030)
pH: 5 (ref 5.0–8.0)

## 2019-07-17 LAB — ABO/RH: ABO/RH(D): O POS

## 2019-07-18 ENCOUNTER — Other Ambulatory Visit: Payer: Self-pay

## 2019-07-18 ENCOUNTER — Ambulatory Visit: Payer: Medicare HMO | Admitting: Cardiology

## 2019-07-18 ENCOUNTER — Encounter: Payer: Self-pay | Admitting: Cardiology

## 2019-07-18 VITALS — BP 104/60 | HR 101 | Ht 60.0 in | Wt 121.0 lb

## 2019-07-18 DIAGNOSIS — I10 Essential (primary) hypertension: Secondary | ICD-10-CM | POA: Diagnosis not present

## 2019-07-18 DIAGNOSIS — I480 Paroxysmal atrial fibrillation: Secondary | ICD-10-CM | POA: Diagnosis not present

## 2019-07-18 NOTE — Progress Notes (Signed)
Cardiology Office Note:    Date:  07/18/2019   ID:  Whitney Floyd, DOB 1935/01/14, MRN [id number]  PCP:  Ernestene Kiel, MD  Cardiologist:  Berniece Salines, DO  Electrophysiologist:  None   Referring MD: Ernestene Kiel, MD   " I am doing well but have my surgery coming up"  History of Present Illness:    Whitney Floyd is a 85 y.o. female with a hx of paroxysmal atrial fibrillation (on Eliquis-5 twice a day, metoprolol succinate 25 mg daily and Cardizem 20 mg daily, steroid-induced hyperglycemia, peripheral neuropathy due to chemotherapy, ovarian cancer, status post chemotherapy pending debulking surgery on July 23, 2019.  Patient is here today she tells me that she has been doing well from a cardiovascular standpoint.  She is tired after chemo but she has been gaining her strength back.  Past Medical History:  Diagnosis Date  . Anemia   . Aortic atherosclerosis ([place])   . Arthritis   . Atrial fibrillation ([place])   . 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 Cataract   . Diverticulosis   . Dysrhythmia    a fib  . GERD (gastroesophageal reflux disease)   . Headache    hx of migraines  . Hyperlipidemia   . Osteopenia   . Paroxysmal A-fib ([place]) 02/08/2019  . Peptic ulcer disease     Past Surgical History:  Procedure Laterality Date  . benign bone tumor removal    . CATARACT EXTRACTION, BILATERAL    . COLONOSCOPY    . TUBAL LIGATION      Current Medications: Current Meds  Medication Sig  . acetaminophen (TYLENOL) 500 MG tablet Take 500-1,000 mg by mouth every 6 (six) hours as needed (for pain.).  Whitney Kitchen dexamethasone (DECADRON) 4 MG tablet Take 2 tabs at the night before and 2 tab the morning of chemotherapy, every 3 weeks, by mouth  . diltiazem (CARDIZEM) 120 MG tablet Take 1 tablet (120 mg total) by mouth daily.  Whitney Kitchen ELIQUIS 5 MG TABS tablet Take 5 mg by mouth 2 (two) times daily.  . metoprolol succinate (TOPROL XL) 25 MG  24 hr tablet Take 1.5 tablets (37.5 mg total) by mouth daily.  Whitney Kitchen omeprazole (PRILOSEC) 20 MG capsule Take 20 mg by mouth 2 (two) times daily before a meal.      Allergies:   Codeine sulfate [codeine], Tetanus toxoids, Horse-derived products, and Sulfa antibiotics   Social History   Socioeconomic History  . Marital status: Divorced    Spouse name: Not on file  . Number of children: 6  . Years of education: Not on file  . Highest education level: Not on file  Occupational History  . Occupation: retired Sales executive  Tobacco Use  . Smoking status: Never Smoker  . Smokeless tobacco: Never Used  Vaping Use  . Vaping Use: Never used  Substance and Sexual Activity  . Alcohol use: Never  . Drug use: Never  . Sexual activity: Not Currently  Other Topics Concern  . Not on file  Social History Narrative   Lived with son, Quita Skye   Social Determinants of Health   Financial Resource Strain:   . Difficulty of Paying Living Expenses:   Food Insecurity:   . Worried About Charity fundraiser in the Last Year:   . Arboriculturist in the Last Year:   Transportation Needs:   . Film/video editor (Medical):   Whitney Kitchen  Lack of Transportation (Non-Medical):   Physical Activity:   . Days of Exercise per Week:   . Minutes of Exercise per Session:   Stress:   . Feeling of Stress :   Social Connections:   . Frequency of Communication with Friends and Family:   . Frequency of Social Gatherings with Friends and Family:   . Attends Religious Services:   . Active Member of Clubs or Organizations:   . Attends Archivist Meetings:   Whitney Kitchen Marital Status:      Family History: The patient's family history includes Arrhythmia in her father; Breast cancer in her paternal aunt; Dementia in her mother; Diabetes in her mother; Other in her mother; Ovarian cancer in her paternal grandmother; Stroke in her father. There is no history of Colon cancer, Uterine cancer, or Prostate cancer.  ROS:     Review of Systems  Constitution: Negative for decreased appetite, fever and weight gain.  HENT: Negative for congestion, ear discharge, hoarse voice and sore throat.   Eyes: Negative for discharge, redness, vision loss in right eye and visual halos.  Cardiovascular: Negative for chest pain, dyspnea on exertion, leg swelling, orthopnea and palpitations.  Respiratory: Negative for cough, hemoptysis, shortness of breath and snoring.   Endocrine: Negative for heat intolerance and polyphagia.  Hematologic/Lymphatic: Negative for bleeding problem. Does not bruise/bleed easily.  Skin: Negative for flushing, nail changes, rash and suspicious lesions.  Musculoskeletal: Negative for arthritis, joint pain, muscle cramps, myalgias, neck pain and stiffness.  Gastrointestinal: Negative for abdominal pain, bowel incontinence, diarrhea and excessive appetite.  Genitourinary: Negative for decreased libido, genital sores and incomplete emptying.  Neurological: Negative for brief paralysis, focal weakness, headaches and loss of balance.  Psychiatric/Behavioral: Negative for altered mental status, depression and suicidal ideas.  Allergic/Immunologic: Negative for HIV exposure and persistent infections.    EKGs/Labs/Other Studies Reviewed:    The following studies were reviewed today:   EKG: None today  Echocardiogram on February 08, 2019 patient normal EF 55 to 60%.  Trace mitral regurgitation.  Otherwise overall normal study.  Recent Labs: 03/04/2019: Magnesium 1.8; TSH 2.580 07/16/2019: ALT 16; BUN 17; Creatinine, Ser 0.73; Hemoglobin 10.3; Platelets 190; Potassium 4.0; Sodium 138  Recent Lipid Panel No results found for: CHOL, TRIG, HDL, CHOLHDL, VLDL, LDLCALC, LDLDIRECT  Physical Exam:    VS:  BP 104/60   Pulse (!) 101   Ht 5' (1.524 m)   Wt 121 lb (54.9 kg)   SpO2 99%   BMI 23.63 kg/m     Wt Readings from Last 3 Encounters:  07/18/19 121 lb (54.9 kg)  07/16/19 123 lb 6 oz (56 kg)  07/16/19  120 lb 12.8 oz (54.8 kg)     GEN: Well nourished, well developed in no acute distress HEENT: Normal NECK: No JVD; No carotid bruits LYMPHATICS: No lymphadenopathy CARDIAC: S1S2 noted,RRR, no murmurs, rubs, gallops RESPIRATORY:  Clear to auscultation without rales, wheezing or rhonchi  ABDOMEN: Soft, non-tender, non-distended, +bowel sounds, no guarding. EXTREMITIES: No edema, No cyanosis, no clubbing MUSCULOSKELETAL:  No deformity  SKIN: Warm and dry NEUROLOGIC:  Alert and oriented x 3, non-focal PSYCHIATRIC:  Normal affect, good insight  ASSESSMENT:    1. Paroxysmal atrial fibrillation (HCC)   2. Essential hypertension    PLAN:     Atrial fibrillation RVR rate is controlled we will continue her rate control agents.  She does take Eliquis for stroke prevention.  The patient does not have any unstable cardiac conditions.  Upon  evaluation today, she can achieve 4 METs or greater without anginal symptoms.  According to [facility] and AHA guidelines, she requires no further cardiac workup prior to her noncardiac surgery and should be at acceptable risk.   Please hold her Eliquis for 2 days (4 doses) prior to her surgery, stop her Eliquis as soon as possible at the discretion of the surgeon performing the procedure.  The patient is in agreement with the above plan. The patient left the office in stable condition.  The patient will follow up in 3 months for surgery.   Medication Adjustments/Labs and Tests Ordered: Current medicines are reviewed at length with the patient today.  Concerns regarding medicines are outlined above.  No orders of the defined types were placed in this encounter.  No orders of the defined types were placed in this encounter.   Patient Instructions  Medication Instructions:  Your physician recommends that you continue on your current medications as directed. Please refer to the Current Medication list given to you today.  *If you need a refill on your cardiac  medications before your next appointment, please call your pharmacy*   Lab Work: None.  If you have labs (blood work) drawn today and your tests are completely normal, you will receive your results only by: Whitney Kitchen MyChart Message (if you have MyChart) OR . A paper copy in the mail If you have any lab test that is abnormal or we need to change your treatment, we will call you to review the results.   Testing/Procedures: None.    Follow-Up: At [facility], you and your health needs are our priority.  As part of our continuing mission to provide you with exceptional heart care, we have created designated Provider Care Teams.  These Care Teams include your primary Cardiologist (physician) and Advanced Practice Providers (APPs -  Physician Assistants and Nurse Practitioners) who all work together to provide you with the care you need, when you need it.  We recommend signing up for the patient portal called "MyChart".  Sign up information is provided on this After Visit Summary.  MyChart is used to connect with patients for Virtual Visits (Telemedicine).  Patients are able to view lab/test results, encounter notes, upcoming appointments, etc.  Non-urgent messages can be sent to your provider as well.   To learn more about what you can do with MyChart, go to [email or web address].    Your next appointment:   3 month(s)  The format for your next appointment:   In Person  Provider:   Berniece Salines, DO   Other Instructions      Adopting a Healthy Lifestyle.  Know what a healthy weight is for you (roughly BMI <25) and aim to maintain this   Aim for 7+ servings of fruits and vegetables daily   65-80+ fluid ounces of water or unsweet tea for healthy kidneys   Limit to max 1 drink of alcohol per day; avoid smoking/tobacco   Limit animal fats in diet for cholesterol and heart health - choose grass fed whenever available   Avoid highly processed foods, and foods high in  saturated/trans fats   Aim for low stress - take time to unwind and care for your mental health   Aim for 150 min of moderate intensity exercise weekly for heart health, and weights twice weekly for bone health   Aim for 7-9 hours of sleep daily   When it comes to diets, agreement about the perfect plan isnt easy to find, even  among the experts. Experts at the [employer] developed an idea known as the Healthy Eating Plate. Just imagine a plate divided into logical, healthy portions.   The emphasis is on diet quality:   Load up on vegetables and fruits - one-half of your plate: Aim for color and variety, and remember that potatoes dont count.   Go for whole grains - one-quarter of your plate: Whole wheat, barley, wheat berries, quinoa, oats, brown rice, and foods made with them. If you want pasta, go with whole wheat pasta.   Protein power - one-quarter of your plate: Fish, chicken, beans, and nuts are all healthy, versatile protein sources. Limit red meat.   The diet, however, does go beyond the plate, offering a few other suggestions.   Use healthy plant oils, such as olive, canola, soy, corn, sunflower and peanut. Check the labels, and avoid partially hydrogenated oil, which have unhealthy trans fats.   If youre thirsty, drink water. Coffee and tea are good in moderation, but skip sugary drinks and limit milk and dairy products to one or two daily servings.   The type of carbohydrate in the diet is more important than the amount. Some sources of carbohydrates, such as vegetables, fruits, whole grains, and beans-are healthier than others.   Finally, stay active  Signed, Berniece Salines, DO  07/18/2019 11:34 AM    [place]

## 2019-07-18 NOTE — Progress Notes (Signed)
Anesthesia Chart Review   Case: [id number] Date/Time: 07/23/19 1320   Procedures:      XI ROBOTIC ASSISTED TOTAL HYSTERECTOMY BILATERAL SALPINGO OOPHORECTOMY WITH OMENTECTOMY AND DEBULKING (N/A )     POSSIBLE LAPAROTOMY (N/A )   Anesthesia type: General   Pre-op diagnosis: OVARIAN CANCER   Location: [facility] ROOM 03 / [facility]   Surgeons: Lafonda Mosses, MD      DISCUSSION:85 y.o. never smoker with h/o GERD, PAF (on Eliquis), ovarian cancer scheduled for above procedure 07/23/2019 with Dr. Jeral Pinch.   Pt seen by cardiologist, Dr. Berniece Salines, 07/18/2019.  Per OV note, "Atrial fibrillation RVR rate is controlled we will continue her rate control agents.  She does take Eliquis for stroke prevention. The patient does not have any unstable cardiac conditions.  Upon evaluation today, she can achieve 4 METs or greater without anginal symptoms.  According to [facility] and AHA guidelines, she requires no further cardiac workup prior to her noncardiac surgery and should be at acceptable risk.  Please hold her Eliquis for 2 days (4 doses) prior to her surgery, stop her Eliquis as soon as possible at the discretion of the surgeon performing the procedure."  Anticipate pt can proceed with planned procedure barring acute status change.   VS: BP 131/72   Pulse 81   Temp 36.5 C (Oral)   Resp 18   Ht 5\' 2"  (1.575 m)   Wt 56 kg   SpO2 100%   BMI 22.57 kg/m   PROVIDERS: Ernestene Kiel, MD is PCP    Berniece Salines, MD is Cardiologist  LABS: Labs reviewed: Acceptable for surgery. (all labs ordered are listed, but only abnormal results are displayed)  Labs Reviewed  CBC WITH DIFFERENTIAL/PLATELET - Abnormal; Notable for the following components:      Result Value   RBC 3.46 (*)    Hemoglobin 10.3 (*)    HCT 32.9 (*)    RDW 19.1 (*)    All other components within normal limits  COMPREHENSIVE METABOLIC PANEL - Abnormal; Notable for the following components:   Glucose, Bld 102 (*)    All other  components within normal limits  URINALYSIS, ROUTINE W REFLEX MICROSCOPIC  TYPE AND SCREEN  ABO/RH     IMAGES:   EKG: 03/04/2019 Rate 91 bpm  Normal sinus rhythm with arrhythmia  CV: Myocardial Perfusion 03/06/2019  The left ventricular ejection fraction is hyperdynamic (>65%).  Nuclear stress EF: 83%.  There was no ST segment deviation noted during stress.  No T wave inversion was noted during stress.  The study is normal.  This is a low risk study.  Echo 02/08/2019 Conclusions 1. Overall left ventricular systolic function is normal with an EF 55-60%.  2. The left atrium is normal in size.  3. There is trace mitral regurgitation Past Medical History:  Diagnosis Date  . Anemia   . Aortic atherosclerosis ([place])   . Arthritis   . Atrial fibrillation ([place])   . 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 Carcinomatosis ([place])    ovarian  . Cataract   . Diverticulosis   . Dysrhythmia    a fib  . GERD (gastroesophageal reflux disease)   . Headache    hx of migraines  . Hyperlipidemia   . Osteopenia   . Paroxysmal A-fib ([place]) 02/08/2019  . Peptic ulcer disease     Past Surgical History:  Procedure Laterality Date  . benign bone tumor removal    .  CATARACT EXTRACTION, BILATERAL    . COLONOSCOPY    . TUBAL LIGATION      MEDICATIONS: . acetaminophen (TYLENOL) 500 MG tablet  . dexamethasone (DECADRON) 4 MG tablet  . diltiazem (CARDIZEM) 120 MG tablet  . ELIQUIS 5 MG TABS tablet  . metoprolol succinate (TOPROL XL) 25 MG 24 hr tablet  . omeprazole (PRILOSEC) 20 MG capsule   No current facility-administered medications for this encounter.    Maia Plan [facility] Pre-Surgical Testing [phone] 07/18/19 @ 1:22 PM

## 2019-07-18 NOTE — Patient Instructions (Signed)

## 2019-07-18 NOTE — Anesthesia Preprocedure Evaluation (Addendum)
Anesthesia Evaluation  Patient identified by MRN, date of birth, ID band Patient awake    Reviewed: Allergy & Precautions, NPO status , Patient's Chart, lab work & pertinent test results  Airway Mallampati: II  TM Distance: >3 FB     Dental   Pulmonary shortness of breath,    breath sounds clear to auscultation       Cardiovascular + dysrhythmias  Rhythm:Regular Rate:Normal     Neuro/Psych  Headaches,    GI/Hepatic Neg liver ROS, PUD, GERD  ,  Endo/Other  negative endocrine ROS  Renal/GU negative Renal ROS     Musculoskeletal  (+) Arthritis ,   Abdominal   Peds  Hematology  (+) anemia ,   Anesthesia Other Findings   Reproductive/Obstetrics                             Anesthesia Physical Anesthesia Plan  ASA: III  Anesthesia Plan: General   Post-op Pain Management:    Induction: Intravenous  PONV Risk Score and Plan: 3 and Ondansetron, Dexamethasone and Midazolam  Airway Management Planned: Oral ETT  Additional Equipment:   Intra-op Plan:   Post-operative Plan: Extubation in OR and Possible Post-op intubation/ventilation  Informed Consent: I have reviewed the patients History and Physical, chart, labs and discussed the procedure including the risks, benefits and alternatives for the proposed anesthesia with the patient or authorized representative who has indicated his/her understanding and acceptance.     Dental advisory given  Plan Discussed with: CRNA and Anesthesiologist  Anesthesia Plan Comments: (Myocardial Perfusion 03/06/2019 The left ventricular ejection fraction is hyperdynamic (>65%). Nuclear stress EF: 83%. There was no ST segment deviation noted during stress. No T wave inversion was noted during stress. The study is normal. This is a low risk study.  Echo 02/08/2019 Conclusions 1. Overall left ventricular systolic function is normal with an EF 55-60%.  2.  The left atrium is normal in size.  3. There is trace mitral regurgitation  Pt seen by cardiologist, Dr. Berniece Salines, 07/18/2019.  Per OV note, "Atrial fibrillation RVR rate is controlled we will continue her rate control agents.  She does take Eliquis for stroke prevention. The patient does not have any unstable cardiac conditions.  Upon evaluation today, she can achieve 4 METs or greater without anginal symptoms.  According to [facility] and AHA guidelines, she requires no further cardiac workup prior to her noncardiac surgery and should be at acceptable risk.  Please hold her Eliquis for 2 days (4 doses) prior to her surgery, stop her Eliquis as soon as possible at the discretion of the surgeon performing the procedure." )       Anesthesia Quick Evaluation

## 2019-07-19 ENCOUNTER — Other Ambulatory Visit (HOSPITAL_COMMUNITY)
Admission: RE | Admit: 2019-07-19 | Discharge: 2019-07-19 | Disposition: A | Discharge status: Home / Self Care | Payer: Medicare HMO | Source: Ambulatory Visit | Attending: Gynecologic Oncology | Admitting: Gynecologic Oncology

## 2019-07-19 ENCOUNTER — Telehealth: Payer: Self-pay

## 2019-07-19 DIAGNOSIS — Z01812 Encounter for preprocedural laboratory examination: Secondary | ICD-10-CM | POA: Diagnosis not present

## 2019-07-19 DIAGNOSIS — Z20822 Contact with and (suspected) exposure to covid-19: Secondary | ICD-10-CM | POA: Diagnosis not present

## 2019-07-19 LAB — SARS CORONAVIRUS 2 (TAT 6-24 HRS): SARS Coronavirus 2: NEGATIVE

## 2019-07-19 NOTE — Telephone Encounter (Signed)
Spoke with daughter Whitney Floyd. Told her that her mother needs to hold the Eliquis after evening dose on Saturday 07-20-19 until directed when to resume after surgery. Reviewed the Pre op instructions with daughter. Pt to arrive at admitting at 130 am and have clear liquids until 0930.  The prior cas could potentially finish earlier and then her mother's surgery could begin earlier. Whitney Floyd verbalized understanding. No questions or concerns noted at this time.

## 2019-07-23 ENCOUNTER — Other Ambulatory Visit: Payer: Self-pay

## 2019-07-23 ENCOUNTER — Inpatient Hospital Stay (HOSPITAL_COMMUNITY): Payer: Medicare HMO | Admitting: Physician Assistant

## 2019-07-23 ENCOUNTER — Encounter (HOSPITAL_COMMUNITY): Payer: Self-pay | Admitting: Gynecologic Oncology

## 2019-07-23 ENCOUNTER — Inpatient Hospital Stay (HOSPITAL_COMMUNITY): Payer: Medicare HMO | Admitting: Certified Registered Nurse Anesthetist

## 2019-07-23 ENCOUNTER — Encounter (HOSPITAL_COMMUNITY)
Admission: RE | Disposition: A | Discharge status: Home / Self Care | Payer: Self-pay | Source: Home / Self Care | Attending: Gynecologic Oncology

## 2019-07-23 ENCOUNTER — Inpatient Hospital Stay (HOSPITAL_COMMUNITY)
Admission: RE | Admit: 2019-07-23 | Discharge: 2019-07-28 | DRG: 749 | Disposition: A | Discharge status: Home / Self Care | Payer: Medicare HMO | Attending: Gynecologic Oncology | Admitting: Gynecologic Oncology

## 2019-07-23 DIAGNOSIS — Z803 Family history of malignant neoplasm of breast: Secondary | ICD-10-CM | POA: Diagnosis not present

## 2019-07-23 DIAGNOSIS — Z5331 Laparoscopic surgical procedure converted to open procedure: Secondary | ICD-10-CM

## 2019-07-23 DIAGNOSIS — K567 Ileus, unspecified: Secondary | ICD-10-CM | POA: Diagnosis not present

## 2019-07-23 DIAGNOSIS — I700 Atherosclerosis of aorta: Secondary | ICD-10-CM | POA: Diagnosis present

## 2019-07-23 DIAGNOSIS — Z882 Allergy status to sulfonamides status: Secondary | ICD-10-CM | POA: Diagnosis not present

## 2019-07-23 DIAGNOSIS — C786 Secondary malignant neoplasm of retroperitoneum and peritoneum: Secondary | ICD-10-CM

## 2019-07-23 DIAGNOSIS — Z8711 Personal history of peptic ulcer disease: Secondary | ICD-10-CM | POA: Diagnosis not present

## 2019-07-23 DIAGNOSIS — M1990 Unspecified osteoarthritis, unspecified site: Secondary | ICD-10-CM | POA: Diagnosis present

## 2019-07-23 DIAGNOSIS — Z7901 Long term (current) use of anticoagulants: Secondary | ICD-10-CM | POA: Diagnosis not present

## 2019-07-23 DIAGNOSIS — D62 Acute posthemorrhagic anemia: Secondary | ICD-10-CM | POA: Diagnosis not present

## 2019-07-23 DIAGNOSIS — Z888 Allergy status to other drugs, medicaments and biological substances status: Secondary | ICD-10-CM

## 2019-07-23 DIAGNOSIS — M8580 Other specified disorders of bone density and structure, unspecified site: Secondary | ICD-10-CM | POA: Diagnosis present

## 2019-07-23 DIAGNOSIS — C562 Malignant neoplasm of left ovary: Secondary | ICD-10-CM

## 2019-07-23 DIAGNOSIS — Z8041 Family history of malignant neoplasm of ovary: Secondary | ICD-10-CM

## 2019-07-23 DIAGNOSIS — Z833 Family history of diabetes mellitus: Secondary | ICD-10-CM | POA: Diagnosis not present

## 2019-07-23 DIAGNOSIS — K5790 Diverticulosis of intestine, part unspecified, without perforation or abscess without bleeding: Secondary | ICD-10-CM | POA: Diagnosis present

## 2019-07-23 DIAGNOSIS — K9189 Other postprocedural complications and disorders of digestive system: Secondary | ICD-10-CM | POA: Diagnosis not present

## 2019-07-23 DIAGNOSIS — I480 Paroxysmal atrial fibrillation: Secondary | ICD-10-CM | POA: Diagnosis present

## 2019-07-23 DIAGNOSIS — Z79899 Other long term (current) drug therapy: Secondary | ICD-10-CM

## 2019-07-23 DIAGNOSIS — C569 Malignant neoplasm of unspecified ovary: Secondary | ICD-10-CM | POA: Diagnosis not present

## 2019-07-23 DIAGNOSIS — D490 Neoplasm of unspecified behavior of digestive system: Secondary | ICD-10-CM | POA: Diagnosis not present

## 2019-07-23 DIAGNOSIS — D6481 Anemia due to antineoplastic chemotherapy: Secondary | ICD-10-CM | POA: Diagnosis present

## 2019-07-23 DIAGNOSIS — K219 Gastro-esophageal reflux disease without esophagitis: Secondary | ICD-10-CM | POA: Diagnosis present

## 2019-07-23 DIAGNOSIS — D631 Anemia in chronic kidney disease: Secondary | ICD-10-CM | POA: Diagnosis not present

## 2019-07-23 DIAGNOSIS — C784 Secondary malignant neoplasm of small intestine: Secondary | ICD-10-CM | POA: Diagnosis not present

## 2019-07-23 DIAGNOSIS — K660 Peritoneal adhesions (postprocedural) (postinfection): Secondary | ICD-10-CM | POA: Diagnosis present

## 2019-07-23 DIAGNOSIS — D649 Anemia, unspecified: Secondary | ICD-10-CM

## 2019-07-23 DIAGNOSIS — E440 Moderate protein-calorie malnutrition: Secondary | ICD-10-CM | POA: Diagnosis present

## 2019-07-23 DIAGNOSIS — C799 Secondary malignant neoplasm of unspecified site: Secondary | ICD-10-CM | POA: Diagnosis present

## 2019-07-23 DIAGNOSIS — Z887 Allergy status to serum and vaccine status: Secondary | ICD-10-CM

## 2019-07-23 DIAGNOSIS — Z823 Family history of stroke: Secondary | ICD-10-CM

## 2019-07-23 DIAGNOSIS — J986 Disorders of diaphragm: Secondary | ICD-10-CM | POA: Diagnosis present

## 2019-07-23 DIAGNOSIS — Z885 Allergy status to narcotic agent status: Secondary | ICD-10-CM | POA: Diagnosis not present

## 2019-07-23 DIAGNOSIS — E785 Hyperlipidemia, unspecified: Secondary | ICD-10-CM | POA: Diagnosis present

## 2019-07-23 DIAGNOSIS — I4891 Unspecified atrial fibrillation: Secondary | ICD-10-CM | POA: Diagnosis not present

## 2019-07-23 DIAGNOSIS — R32 Unspecified urinary incontinence: Secondary | ICD-10-CM | POA: Diagnosis present

## 2019-07-23 LAB — PREPARE RBC (CROSSMATCH)

## 2019-07-23 SURGERY — XI ROBOTIC ASSISTED TOTAL HYSTERECTOMY BILATERAL SALPINGO OOPHORECTOMY WITH OMENTECTOMY AND DEBULKING
Anesthesia: General

## 2019-07-23 MED ORDER — ACETAMINOPHEN 10 MG/ML IV SOLN
1000.0000 mg | Freq: Two times a day (BID) | INTRAVENOUS | Status: AC
  Administered 2019-07-23 – 2019-07-24 (×2): 1000 mg via INTRAVENOUS
  Filled 2019-07-23 (×3): qty 100

## 2019-07-23 MED ORDER — SODIUM CHLORIDE 0.9 % IV SOLN: 500.0000 mg | Freq: Once | INTRAVENOUS | Status: DC

## 2019-07-23 MED ORDER — 0.9 % SODIUM CHLORIDE (POUR BTL) OPTIME
TOPICAL | Status: DC | PRN
  Administered 2019-07-23: 3000 mL

## 2019-07-23 MED ORDER — LACTATED RINGERS IV SOLN: INTRAVENOUS | Status: DC

## 2019-07-23 MED ORDER — ONDANSETRON HCL 4 MG/2ML IJ SOLN
4.0000 mg | Freq: Four times a day (QID) | INTRAMUSCULAR | Status: DC | PRN
  Administered 2019-07-26: 4 mg via INTRAVENOUS
  Filled 2019-07-23: qty 2

## 2019-07-23 MED ORDER — SODIUM CHLORIDE (PF) 0.9 % IJ SOLN
INTRAMUSCULAR | Status: AC
  Filled 2019-07-23: qty 10

## 2019-07-23 MED ORDER — ALBUMIN HUMAN 5 % IV SOLN: INTRAVENOUS | Status: DC | PRN

## 2019-07-23 MED ORDER — LACTATED RINGERS IV SOLN
INTRAVENOUS | Status: DC | PRN
Start: 2019-07-23 — End: 2019-07-23

## 2019-07-23 MED ORDER — DEXAMETHASONE SODIUM PHOSPHATE 4 MG/ML IJ SOLN: 4.0000 mg | INTRAMUSCULAR | Status: DC

## 2019-07-23 MED ORDER — PHENYLEPHRINE HCL-NACL 10-0.9 MG/250ML-% IV SOLN
INTRAVENOUS | Status: DC | PRN
Start: 2019-07-23 — End: 2019-07-23
  Administered 2019-07-23: 50 ug/min via INTRAVENOUS

## 2019-07-23 MED ORDER — LIDOCAINE 2% (20 MG/ML) 5 ML SYRINGE
INTRAMUSCULAR | Status: DC | PRN
Start: 2019-07-23 — End: 2019-07-23
  Administered 2019-07-23: 1.5 mg/kg/h via INTRAVENOUS

## 2019-07-23 MED ORDER — PHENYLEPHRINE HCL (PRESSORS) 10 MG/ML IV SOLN
INTRAVENOUS | Status: AC
  Filled 2019-07-23: qty 1

## 2019-07-23 MED ORDER — SODIUM CHLORIDE 0.9 % IV SOLN
1.0000 g | INTRAVENOUS | Status: DC
  Filled 2019-07-23: qty 1

## 2019-07-23 MED ORDER — SUGAMMADEX SODIUM 200 MG/2ML IV SOLN
INTRAVENOUS | Status: DC | PRN
  Administered 2019-07-23: 120 mg via INTRAVENOUS

## 2019-07-23 MED ORDER — ALBUMIN HUMAN 5 % IV SOLN
INTRAVENOUS | Status: AC
  Filled 2019-07-23: qty 500

## 2019-07-23 MED ORDER — ROCURONIUM BROMIDE 10 MG/ML (PF) SYRINGE
PREFILLED_SYRINGE | INTRAVENOUS | Status: AC
  Filled 2019-07-23: qty 10

## 2019-07-23 MED ORDER — FENTANYL CITRATE (PF) 100 MCG/2ML IJ SOLN
INTRAMUSCULAR | Status: DC | PRN
  Administered 2019-07-23 (×4): 50 ug via INTRAVENOUS

## 2019-07-23 MED ORDER — ACETAMINOPHEN 500 MG PO TABS
1000.0000 mg | ORAL_TABLET | ORAL | Status: AC
  Administered 2019-07-23: 1000 mg via ORAL
  Filled 2019-07-23: qty 2

## 2019-07-23 MED ORDER — ONDANSETRON HCL 4 MG PO TABS: 4.0000 mg | ORAL_TABLET | Freq: Four times a day (QID) | ORAL | Status: DC | PRN

## 2019-07-23 MED ORDER — ENOXAPARIN SODIUM 40 MG/0.4ML ~~LOC~~ SOLN: 40.0000 mg | SUBCUTANEOUS | Status: DC

## 2019-07-23 MED ORDER — HYDROMORPHONE HCL 1 MG/ML IJ SOLN
INTRAMUSCULAR | Status: DC | PRN
  Administered 2019-07-23 (×2): .4 mg via INTRAVENOUS
  Administered 2019-07-23: .2 mg via INTRAVENOUS

## 2019-07-23 MED ORDER — BUPIVACAINE LIPOSOME 1.3 % IJ SUSP
20.0000 mL | Freq: Once | INTRAMUSCULAR | Status: AC
  Administered 2019-07-23: 20 mL
  Filled 2019-07-23: qty 20

## 2019-07-23 MED ORDER — SODIUM CHLORIDE 0.9 % IV SOLN
1.0000 g | Freq: Once | INTRAVENOUS | Status: AC
  Administered 2019-07-23: 1 g via INTRAVENOUS
  Filled 2019-07-23: qty 1

## 2019-07-23 MED ORDER — STERILE WATER FOR IRRIGATION IR SOLN
Status: DC | PRN
  Administered 2019-07-23: 1000 mL

## 2019-07-23 MED ORDER — HYDROMORPHONE HCL 2 MG/ML IJ SOLN
INTRAMUSCULAR | Status: AC
  Filled 2019-07-23: qty 1

## 2019-07-23 MED ORDER — METOPROLOL SUCCINATE ER 25 MG PO TB24
37.5000 mg | ORAL_TABLET | Freq: Every day | ORAL | Status: DC
  Administered 2019-07-25 – 2019-07-27 (×3): 37.5 mg via ORAL
  Filled 2019-07-23 (×3): qty 2

## 2019-07-23 MED ORDER — ONDANSETRON HCL 4 MG/2ML IJ SOLN
INTRAMUSCULAR | Status: AC
  Filled 2019-07-23: qty 2

## 2019-07-23 MED ORDER — SUCCINYLCHOLINE CHLORIDE 200 MG/10ML IV SOSY
PREFILLED_SYRINGE | INTRAVENOUS | Status: DC | PRN
  Administered 2019-07-23: 120 mg via INTRAVENOUS

## 2019-07-23 MED ORDER — SODIUM CHLORIDE 0.9% IV SOLUTION: Freq: Once | INTRAVENOUS | Status: DC

## 2019-07-23 MED ORDER — LIDOCAINE 2% (20 MG/ML) 5 ML SYRINGE
INTRAMUSCULAR | Status: AC
  Filled 2019-07-23: qty 5

## 2019-07-23 MED ORDER — DEXAMETHASONE SODIUM PHOSPHATE 10 MG/ML IJ SOLN
INTRAMUSCULAR | Status: DC | PRN
  Administered 2019-07-23: 5 mg via INTRAVENOUS

## 2019-07-23 MED ORDER — SUCCINYLCHOLINE CHLORIDE 200 MG/10ML IV SOSY
PREFILLED_SYRINGE | INTRAVENOUS | Status: AC
  Filled 2019-07-23: qty 10

## 2019-07-23 MED ORDER — LIDOCAINE HCL URETHRAL/MUCOSAL 2 % EX GEL
CUTANEOUS | Status: AC
  Filled 2019-07-23: qty 30

## 2019-07-23 MED ORDER — GABAPENTIN 100 MG PO CAPS
100.0000 mg | ORAL_CAPSULE | ORAL | Status: AC
  Administered 2019-07-23: 100 mg via ORAL
  Filled 2019-07-23: qty 1

## 2019-07-23 MED ORDER — DEXAMETHASONE SODIUM PHOSPHATE 10 MG/ML IJ SOLN
INTRAMUSCULAR | Status: AC
  Filled 2019-07-23: qty 1

## 2019-07-23 MED ORDER — KCL IN DEXTROSE-NACL 20-5-0.45 MEQ/L-%-% IV SOLN
INTRAVENOUS | Status: DC
  Filled 2019-07-23 (×5): qty 1000

## 2019-07-23 MED ORDER — DILTIAZEM HCL 60 MG PO TABS
120.0000 mg | ORAL_TABLET | Freq: Every day | ORAL | Status: DC
  Administered 2019-07-25 – 2019-07-27 (×3): 120 mg via ORAL
  Filled 2019-07-23 (×5): qty 2

## 2019-07-23 MED ORDER — LACTATED RINGERS IR SOLN
Status: DC | PRN
  Administered 2019-07-23: 1

## 2019-07-23 MED ORDER — BUPIVACAINE HCL 0.25 % IJ SOLN
INTRAMUSCULAR | Status: AC
  Filled 2019-07-23: qty 1

## 2019-07-23 MED ORDER — LIDOCAINE 2% (20 MG/ML) 5 ML SYRINGE
INTRAMUSCULAR | Status: DC | PRN
  Administered 2019-07-23: 50 mg via INTRAVENOUS

## 2019-07-23 MED ORDER — HEPARIN SODIUM (PORCINE) 5000 UNIT/ML IJ SOLN
5000.0000 [IU] | INTRAMUSCULAR | Status: AC
  Administered 2019-07-23: 5000 [IU] via SUBCUTANEOUS
  Filled 2019-07-23: qty 1

## 2019-07-23 MED ORDER — ORAL CARE MOUTH RINSE: 15.0000 mL | Freq: Once | OROMUCOSAL | Status: AC

## 2019-07-23 MED ORDER — ONDANSETRON HCL 4 MG/2ML IJ SOLN
INTRAMUSCULAR | Status: DC | PRN
  Administered 2019-07-23: 4 mg via INTRAVENOUS

## 2019-07-23 MED ORDER — CEFAZOLIN SODIUM-DEXTROSE 2-4 GM/100ML-% IV SOLN
2.0000 g | INTRAVENOUS | Status: AC
  Administered 2019-07-23: 2 g via INTRAVENOUS
  Filled 2019-07-23: qty 100

## 2019-07-23 MED ORDER — PHENYLEPHRINE 40 MCG/ML (10ML) SYRINGE FOR IV PUSH (FOR BLOOD PRESSURE SUPPORT)
PREFILLED_SYRINGE | INTRAVENOUS | Status: DC | PRN
  Administered 2019-07-23 (×4): 80 ug via INTRAVENOUS

## 2019-07-23 MED ORDER — PROPOFOL 10 MG/ML IV BOLUS
INTRAVENOUS | Status: AC
  Filled 2019-07-23: qty 20

## 2019-07-23 MED ORDER — CHLORHEXIDINE GLUCONATE 0.12 % MT SOLN
15.0000 mL | Freq: Once | OROMUCOSAL | Status: AC
  Administered 2019-07-23: 15 mL via OROMUCOSAL

## 2019-07-23 MED ORDER — HYDROMORPHONE HCL 1 MG/ML IJ SOLN
0.5000 mg | INTRAMUSCULAR | Status: DC | PRN
  Administered 2019-07-23 – 2019-07-25 (×7): 0.5 mg via INTRAVENOUS
  Filled 2019-07-23 (×7): qty 0.5

## 2019-07-23 MED ORDER — ROCURONIUM BROMIDE 10 MG/ML (PF) SYRINGE
PREFILLED_SYRINGE | INTRAVENOUS | Status: DC | PRN
  Administered 2019-07-23 (×2): 20 mg via INTRAVENOUS
  Administered 2019-07-23: 30 mg via INTRAVENOUS
  Administered 2019-07-23: 20 mg via INTRAVENOUS

## 2019-07-23 MED ORDER — PHENYLEPHRINE 40 MCG/ML (10ML) SYRINGE FOR IV PUSH (FOR BLOOD PRESSURE SUPPORT)
PREFILLED_SYRINGE | INTRAVENOUS | Status: AC
  Filled 2019-07-23: qty 10

## 2019-07-23 MED ORDER — FENTANYL CITRATE (PF) 100 MCG/2ML IJ SOLN
INTRAMUSCULAR | Status: AC
  Filled 2019-07-23: qty 2

## 2019-07-23 MED ORDER — PANTOPRAZOLE SODIUM 40 MG IV SOLR
40.0000 mg | INTRAVENOUS | Status: DC
  Administered 2019-07-23 – 2019-07-25 (×3): 40 mg via INTRAVENOUS
  Filled 2019-07-23 (×3): qty 40

## 2019-07-23 MED ORDER — PROPOFOL 10 MG/ML IV BOLUS
INTRAVENOUS | Status: DC | PRN
  Administered 2019-07-23: 120 mg via INTRAVENOUS

## 2019-07-23 MED ORDER — FENTANYL CITRATE (PF) 100 MCG/2ML IJ SOLN: 25.0000 ug | INTRAMUSCULAR | Status: DC | PRN

## 2019-07-23 MED ORDER — LIDOCAINE 2% (20 MG/ML) 5 ML SYRINGE
INTRAMUSCULAR | Status: DC | PRN
Start: 2019-07-23 — End: 2019-07-23

## 2019-07-23 MED ORDER — BUPIVACAINE HCL 0.25 % IJ SOLN
INTRAMUSCULAR | Status: DC | PRN
  Administered 2019-07-23: 50 mL

## 2019-07-23 SURGICAL SUPPLY — 80 items
ADH SKN CLS APL DERMABOND .7 (GAUZE/BANDAGES/DRESSINGS) ×1
AGENT HMST KT MTR STRL THRMB (HEMOSTASIS)
APL ESCP 34 STRL LF DISP (HEMOSTASIS)
APPLICATOR SURGIFLO ENDO (HEMOSTASIS) IMPLANT
BACTOSHIELD CHG 4% 4OZ (MISCELLANEOUS) ×1
BAG LAPAROSCOPIC 12 15 PORT 16 (BASKET) IMPLANT
BAG RETRIEVAL 12/15 (BASKET)
BAG SPEC RTRVL LRG 6X4 10 (ENDOMECHANICALS)
BLADE SURG SZ10 CARB STEEL (BLADE) IMPLANT
COVER BACK TABLE 60X90IN (DRAPES) ×2 IMPLANT
COVER TIP SHEARS 8 DVNC (MISCELLANEOUS) ×1 IMPLANT
COVER TIP SHEARS 8MM DA VINCI (MISCELLANEOUS) ×2
COVER WAND RF STERILE (DRAPES) IMPLANT
DECANTER SPIKE VIAL GLASS SM (MISCELLANEOUS) IMPLANT
DERMABOND ADVANCED (GAUZE/BANDAGES/DRESSINGS) ×1
DERMABOND ADVANCED .7 DNX12 (GAUZE/BANDAGES/DRESSINGS) ×1 IMPLANT
DRAPE ARM DVNC X/XI (DISPOSABLE) ×4 IMPLANT
DRAPE COLUMN DVNC XI (DISPOSABLE) ×1 IMPLANT
DRAPE DA VINCI XI ARM (DISPOSABLE) ×8
DRAPE DA VINCI XI COLUMN (DISPOSABLE) ×2
DRAPE SHEET LG 3/4 BI-LAMINATE (DRAPES) ×2 IMPLANT
DRAPE SURG IRRIG POUCH 19X23 (DRAPES) ×2 IMPLANT
DRSG OPSITE POSTOP 4X10 (GAUZE/BANDAGES/DRESSINGS) ×1 IMPLANT
DRSG OPSITE POSTOP 4X6 (GAUZE/BANDAGES/DRESSINGS) IMPLANT
DRSG OPSITE POSTOP 4X8 (GAUZE/BANDAGES/DRESSINGS) IMPLANT
ELECT REM PT RETURN 15FT ADLT (MISCELLANEOUS) ×2 IMPLANT
GLOVE BIO SURGEON STRL SZ 6 (GLOVE) ×8 IMPLANT
GLOVE BIO SURGEON STRL SZ 6.5 (GLOVE) ×4 IMPLANT
GOWN STRL REUS W/ TWL LRG LVL3 (GOWN DISPOSABLE) ×4 IMPLANT
GOWN STRL REUS W/TWL LRG LVL3 (GOWN DISPOSABLE) ×8
HOLDER FOLEY CATH W/STRAP (MISCELLANEOUS) ×2 IMPLANT
IRRIG SUCT STRYKERFLOW 2 WTIP (MISCELLANEOUS) ×2
IRRIGATION SUCT STRKRFLW 2 WTP (MISCELLANEOUS) ×1 IMPLANT
KIT PROCEDURE DA VINCI SI (MISCELLANEOUS)
KIT PROCEDURE DVNC SI (MISCELLANEOUS) IMPLANT
KIT TURNOVER KIT A (KITS) IMPLANT
LIGASURE IMPACT 36 18CM CVD LR (INSTRUMENTS) ×2 IMPLANT
MANIPULATOR UTERINE 4.5 ZUMI (MISCELLANEOUS) ×2 IMPLANT
NDL HYPO 21X1.5 SAFETY (NEEDLE) ×1 IMPLANT
NDL SPNL 18GX3.5 QUINCKE PK (NEEDLE) IMPLANT
NEEDLE HYPO 21X1.5 SAFETY (NEEDLE) ×2 IMPLANT
NEEDLE SPNL 18GX3.5 QUINCKE PK (NEEDLE) IMPLANT
OBTURATOR OPTICAL STANDARD 8MM (TROCAR) ×2
OBTURATOR OPTICAL STND 8 DVNC (TROCAR) ×1
OBTURATOR OPTICALSTD 8 DVNC (TROCAR) ×1 IMPLANT
PACK ROBOT GYN WLCUSTOM (TRAY / TRAY PROCEDURE) ×2 IMPLANT
PAD POSITIONING PINK XL (MISCELLANEOUS) ×2 IMPLANT
PENCIL SMOKE EVACUATOR (MISCELLANEOUS) IMPLANT
PORT ACCESS TROCAR AIRSEAL 12 (TROCAR) ×1 IMPLANT
PORT ACCESS TROCAR AIRSEAL 5M (TROCAR) ×1
POUCH SPECIMEN RETRIEVAL 10MM (ENDOMECHANICALS) IMPLANT
RELOAD PROXIMATE 75MM BLUE (ENDOMECHANICALS) ×4 IMPLANT
RELOAD STAPLE 75 3.8 BLU REG (ENDOMECHANICALS) IMPLANT
SCRUB CHG 4% DYNA-HEX 4OZ (MISCELLANEOUS) ×1 IMPLANT
SEAL CANN UNIV 5-8 DVNC XI (MISCELLANEOUS) ×3 IMPLANT
SEAL XI 5MM-8MM UNIVERSAL (MISCELLANEOUS) ×6
SET TRI-LUMEN FLTR TB AIRSEAL (TUBING) ×2 IMPLANT
SPONGE LAP 18X18 RF (DISPOSABLE) ×3 IMPLANT
STAPLER GUN LINEAR PROX 60 (STAPLE) ×2 IMPLANT
STAPLER PROXIMATE 75MM BLUE (STAPLE) ×2 IMPLANT
SURGIFLO W/THROMBIN 8M KIT (HEMOSTASIS) IMPLANT
SUT MNCRL AB 4-0 PS2 18 (SUTURE) IMPLANT
SUT PDS AB 1 TP1 96 (SUTURE) ×4 IMPLANT
SUT VIC AB 0 CT1 27 (SUTURE)
SUT VIC AB 0 CT1 27XBRD ANTBC (SUTURE) IMPLANT
SUT VIC AB 0 CT1 36 (SUTURE) ×2 IMPLANT
SUT VIC AB 2-0 CT1 27 (SUTURE) ×4
SUT VIC AB 2-0 CT1 27XBRD (SUTURE) IMPLANT
SUT VIC AB 2-0 CT1 TAPERPNT 27 (SUTURE) IMPLANT
SUT VIC AB 2-0 SH 27 (SUTURE) ×6
SUT VIC AB 2-0 SH 27X BRD (SUTURE) IMPLANT
SUT VICRYL 4-0 PS2 18IN ABS (SUTURE) ×4 IMPLANT
SYR 10ML LL (SYRINGE) IMPLANT
TOWEL OR NON WOVEN STRL DISP B (DISPOSABLE) ×2 IMPLANT
TRAP SPECIMEN MUCUS 40CC (MISCELLANEOUS) IMPLANT
TRAY FOLEY MTR SLVR 16FR STAT (SET/KITS/TRAYS/PACK) ×2 IMPLANT
TROCAR XCEL NON-BLD 5MMX100MML (ENDOMECHANICALS) IMPLANT
UNDERPAD 30X36 HEAVY ABSORB (UNDERPADS AND DIAPERS) ×2 IMPLANT
WATER STERILE IRR 1000ML POUR (IV SOLUTION) ×2 IMPLANT
YANKAUER SUCT BULB TIP 10FT TU (MISCELLANEOUS) IMPLANT

## 2019-07-23 NOTE — Discharge Instructions (Signed)
07/23/2019  Activity: 1. Be up and out of the bed during the day.  Take a nap if needed.  You may walk up steps but be careful and use the hand rail.  Stair climbing will tire you more than you think, you may need to stop part way and rest.   2. No lifting or straining for 6 weeks.  3. No driving for 1-2 weeks.  Do Not drive if you are taking narcotic pain medicine and until you can brake safely.  4. Shower daily.  Use soap and water on your incision and pat dry; don't rub.   Medications:  - Take ibuprofen and tylenol first line for pain control. Take these regularly (every 6 hours) to decrease the build up of pain.  - If necessary, for severe pain not relieved by ibuprofen, take percocet.  - While taking percocet you should take sennakot every night to reduce the likelihood of constipation. If this causes diarrhea, stop its use.  Diet: 1. Low sodium Heart Healthy Diet is recommended.  2. It is safe to use a laxative if you have difficulty moving your bowels.   Wound Care: 1. Keep clean and dry.  Shower daily.  Reasons to call the Doctor:   Fever - Oral temperature greater than 100.4 degrees Fahrenheit  Foul-smelling vaginal discharge  Difficulty urinating  Nausea and vomiting  Increased pain at the site of the incision that is unrelieved with pain medicine.  Difficulty breathing with or without chest pain  New calf pain especially if only on one side  Sudden, continuing increased vaginal bleeding with or without clots.   Follow-up: 1. See Jeral Pinch in 3 weeks.  Contacts: For questions or concerns you should contact:  Dr. Jeral Pinch at [phone] After hours and on week-ends call [phone] and ask to speak to the physician on call for Gynecologic Oncology

## 2019-07-23 NOTE — Op Note (Addendum)
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  Dr. Everitt Amber -intraoperative consultation.  Anesthesia: GET  Urine Output: 100 cc  Operative Findings:  : On EUA, small mobile uterus, some nodularity appreciated in the cul-de-sac.  On intra-abdominal entry, significant disease burden noted including large omental cake adherent to the anterior abdominal wall, extensive diaphragmatic disease (right greater than left), adhesions between the liver and the diaphragm, disease within the porta hepatis.  After conversion to exploratory laparotomy, additional disease was noted on the stomach and significant disease burden on the sigmoid and rectum, both the colon and mesentery.  All of this disease was diffuse but small volume.  Additionally there were miliary implants along much of the abdominal peritoneum.  Uterus was 4-6 cm and normal in appearance.  Right fallopian tube and ovary atrophic and normal appearing, left ovary mildly enlarged and adherent to the sidewall with what appeared to be some tumor studding.  Small tumor implants were noted over much of the small bowel mesentery with 2 loops of small bowel adherent to the underside of the omental cake.  Appendix with tumor burden and adherent to the right sidewall.  Transverse colon significantly adherent to the omental cake which measured approximately 25 x 10 cm. Suboptimal resection due to burden of disease.  Given  inability to resect, resection of tumor burden thought to be causing patient symptoms was performed.  Estimated Blood Loss:  350 cc      Total IV Fluids: 1800 ml crystalloid, 500 cc albumin         Specimens: omental cake, small bowel segment         Complications:  None apparent; patient tolerated the procedure well.         Disposition: PACU - hemodynamically stable.  Procedure Details  The patient was seen in the Holding Room. The risks, benefits, complications, treatment options, and expected outcomes were discussed with the patient.  The patient concurred with the proposed plan, giving informed consent.  The site of surgery properly noted/marked. The patient was identified as Whitney Floyd and the procedure verified as a Robotic-assisted ovarian cancer interval debulking surgery, possible conversion to laparotomy.   After induction of anesthesia, the patient was draped and prepped in the usual sterile manner. Patient was placed in supine position after anesthesia and draped and prepped in the usual sterile manner as follows: Her arms were tucked to her side with all appropriate precautions.  The shoulders were stabilized with padded shoulder blocks applied to the acromium processes.  The patient was placed in the semi-lithotomy position in [place].  The perineum and vagina were prepped with CholoraPrep. The patient was draped after the CholoraPrep had been allowed to dry for 3 minutes.  A Time Out was held and the above information confirmed.  The urethra was prepped with Betadine. Foley catheter was placed.  Scissors were used to create a small episiotomy to allow for speculum and manipulator placement. A sterile speculum was placed in the vagina.  The cervix was grasped with a single-tooth tenaculum. The cervix was dilated  with Kennon Rounds dilators.  The ZUMI uterine manipulator with a small colpotomizer ring was placed without difficulty.  OG tube placement was confirmed and to suction.    Next, a 10 mm skin incision was made 1 cm below the subcostal margin in the midclavicular line.  The 5 mm Optiview port and scope was used for direct entry.  Opening pressure was under 10 mm CO2.  The abdomen was insufflated and the findings were noted as above.   Two additional trochars were placed on the patient's left.  Blunt dissection was used to attempt mobilization of the omental cake from the anterior abdominal wall.  Given adherence of the caking as well as disease burden, decision made quickly to convert to laparotomy.   With the abdomen still insufflated, A midline vertical incision was made and carried through the subcutaneous tissue to the fascia. The fascial incision was made and extended superiorally. The rectus muscles were separated. The peritoneum was identified and entered. Peritoneal incision was extended longitudinally.  Robotic trochars were removed from the abdomen. A survey of the abdomen and pelvis revealed the above findings.  Attention was first turned to the omental cake, where blunt dissection was used to free the omental cake from the anterior abdominal wall bilaterally.   2 loops of small bowel were adherent densely to the omental cake.  Accommodation of sharp dissection and blunt dissection were used to free these loops of small bowel.  An unavoidable enterotomy was performed during this lysis of adhesions.  The small bowel enterotomy was suture ligated with 0 Vicryl to mark for subsequent bowel resection.  An infragastric omentectomy was performed using a Ligasure. Given tumor burden, the lesser sac was somewhat obliterated.  With meticulous dissection using sharp dissection, blunt dissection and electrocautery, the omental cake was dissected free from the transverse colon.  During this portion of the procedure, I called in Dr. Everitt Amber given my concern that I would not be able to achieve optimal cytoreduction.  She agreed with this assessment and the decision was made to  complete the omentectomy for symptom relief.  The cake extended almost up to the stomach and thus several short gastrics had to be cauterized and transected using the LigaSure device. An area of possible serosal disruption along the transverse colon that was noted to be oozing was reapproximated with several interrupted 2-0 Vicryl sutures. Hemostasis was assured.  Attention was then turned to resection of the small bowel.  The section of ileum including the enterotomy was again identified and a segment of the ileum on either side that was free of disease also noted.  A window was created at the junction of the bowel wall and mesentery. The bowel stapler was passed through the window and and the 61mm GIA stapler was deployed and fired. At the distal margin a similar process took place. The mesentery was scored then sealed and transected with ligasure to free up the segment of ileum.  Several interrupted sutures with 2-0 Vicryl were placed along antimesenteric surfaces of the small bowel to take tension off of the anastomosis.  The corners of the bowel ends were opened. The GIA stapler was passed into these limbs, deployed and fired to create a stapled functional side-to-side anastamosis. The opening of the two limbs was reapproximated with allis clamps. A 24mm TA stapler was used to seal the top of the anastamosis with a double layer of staples.  Given some concern that the TA had misfired, a second stapler was placed  just inferior to the TA stapler and refire.  The mesenteric window was closed with 2-0 vicryl interrupted.  The abdomen and pelvis was then copiously irrigated with hemostasis noted. The fascia was reapproximated with 0 looped PDS using a total of two sutures. The subcutaneous layer was then irrigated copiously.  Exparel was injected.  The subcutaneous layer was reapproximated with interrupted 2-0 Vicryl. The subcutaneous layer was then reapproximated with a running 4-0 Monocryl.  The subcutaneous  tissue of the 3 port sites was reapproximated with 4-0 Vicryl and the skin closed with 4-0 Monocryl in subcuticular fashion.  Dermabond was applied to the skin of all incisions.  A honeycomb dressing was then applied to the midline laparotomy.  The patient tolerated the procedure well.   The vagina was swabbed with  minimal bleeding noted.   All sponge, lap and needle counts were correct x  3.   The patient was transferred to the recovery room in stable condition.  Jeral Pinch, MD

## 2019-07-23 NOTE — Brief Op Note (Signed)
07/23/2019  3:58 PM  PATIENT:  Whitney Floyd  85 y.o. female  PRE-OPERATIVE DIAGNOSIS:  OVARIAN CANCER  POST-OPERATIVE DIAGNOSIS:  OVARIAN CANCER  PROCEDURE:  Procedure(s): EXPLORATORY LAPAROTOMY WITH OMENTECTOMY, SMALL BOWEL RESECTION WITH ANASTOMOSIS (N/A)  SURGEON:  Surgeon(s) and Role:    * Lafonda Mosses, MD - Primary    * Lahoma Crocker, MD - Assisting    * Everitt Amber, MD - Assisting  ANESTHESIA:   general  EBL:  350 mL   BLOOD ADMINISTERED:none  DRAINS: none   LOCAL MEDICATIONS USED:  exparel  SPECIMEN: omental cake, small bowel segment  DISPOSITION OF SPECIMEN:  PATHOLOGY  COUNTS:  YES  TOURNIQUET:  * No tourniquets in log *  DICTATION: .Note written in EPIC  PLAN OF CARE: Admit to inpatient   PATIENT DISPOSITION:  PACU - hemodynamically stable.   Delay start of Pharmacological VTE agent (>24hrs) due to surgical blood loss or risk of bleeding: no

## 2019-07-23 NOTE — Anesthesia Postprocedure Evaluation (Signed)
Anesthesia Post Note  Patient: Whitney Floyd  Procedure(s) Performed: EXPLORATORY LAPAROTOMY WITH OMENTECTOMY, SMALL BOWEL RESECTION WITH ANASTOMOSIS (N/A )     Patient location during evaluation: PACU Anesthesia Type: General Level of consciousness: awake Pain management: pain level controlled Vital Signs Assessment: post-procedure vital signs reviewed and stable Respiratory status: spontaneous breathing Cardiovascular status: stable Postop Assessment: no apparent nausea or vomiting Anesthetic complications: no   No complications documented.  Last Vitals:  Vitals:   07/23/19 1615 07/23/19 1630  BP: 105/78 (!) 108/91  Pulse: 66 69  Resp: 11 14  Temp:    SpO2: 100% 95%    Last Pain:  Vitals:   07/23/19 1630  TempSrc:   PainSc: 0-No pain                 Ellenora Talton

## 2019-07-23 NOTE — Transfer of Care (Signed)
Immediate Anesthesia Transfer of Care Note  Patient: Whitney Floyd  Procedure(s) Performed: EXPLORATORY LAPAROTOMY WITH OMENTECTOMY, SMALL BOWEL RESECTION WITH ANASTOMOSIS (N/A )  Patient Location: PACU  Anesthesia Type:General  Level of Consciousness: drowsy  Airway & Oxygen Therapy: Patient Spontanous Breathing and Patient connected to face mask oxygen  Post-op Assessment: Report given to RN and Post -op Vital signs reviewed and stable  Post vital signs: Reviewed and stable  Last Vitals:  Vitals Value Taken Time  BP    Temp    Pulse 72 07/23/19 1607  Resp 11 07/23/19 1607  SpO2 100 % 07/23/19 1607  Vitals shown include unvalidated device data.  Last Pain:  Vitals:   07/23/19 1128  TempSrc: Oral  PainSc: 2       Patients Stated Pain Goal: 3 (43/60/16 5800)  Complications: No complications documented.

## 2019-07-23 NOTE — Anesthesia Procedure Notes (Addendum)
Procedure Name: Intubation Date/Time: 07/23/2019 12:41 PM Performed by: West Pugh, CRNA Pre-anesthesia Checklist: Patient identified, Emergency Drugs available, Suction available, Patient being monitored and Timeout performed Patient Re-evaluated:Patient Re-evaluated prior to induction Oxygen Delivery Method: Circle system utilized Preoxygenation: Pre-oxygenation with 100% oxygen Induction Type: IV induction Ventilation: Mask ventilation without difficulty Laryngoscope Size: Mac and 3 Grade View: Grade I Tube type: Oral Tube size: 7.0 mm Airway Equipment and Method: Stylet Placement Confirmation: ETT inserted through vocal cords under direct vision,  positive ETCO2,  CO2 detector and breath sounds checked- equal and bilateral Secured at: 22 cm Tube secured with: Tape Dental Injury: Teeth and Oropharynx as per pre-operative assessment  Comments: Daisy Floro, SRNA intubated with MAC #3 x 1 attempt. CRNA and Anesthesiologist present for procedure.

## 2019-07-23 NOTE — H&P (Signed)
GYNECOLOGIC ONCOLOGY History and Physical  Patient Name: Whitney Floyd  Patient Age: 85 y.o.  History of Present Illness:  Whitney Floyd is a 85 y.o. y.o. female who presents for interval debulking after 4 cycles of NACT.  PAST MEDICAL HISTORY:  Past Medical History:  Diagnosis Date  . Anemia   . Aortic atherosclerosis ([place])   . Arthritis   . Atrial fibrillation ([place])   . 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 Carcinomatosis ([place])    ovarian  . Cataract   . Diverticulosis   . Dysrhythmia    a fib  . GERD (gastroesophageal reflux disease)   . Headache    hx of migraines  . Hyperlipidemia   . Osteopenia   . Paroxysmal A-fib ([place]) 02/08/2019  . Peptic ulcer disease      PAST SURGICAL HISTORY:  Past Surgical History:  Procedure Laterality Date  . benign bone tumor removal    . CATARACT EXTRACTION, BILATERAL    . COLONOSCOPY    . TUBAL LIGATION      OB/GYN HISTORY:  OB History  Gravida Para Term Preterm AB Living  6 6          SAB TAB Ectopic Multiple Live Births               # Outcome Date GA Lbr Len/2nd Weight Sex Delivery Anes PTL Lv  6 Para           5 Para           4 Para           3 Para           2 Para           1 Para             No LMP recorded. Patient is postmenopausal.  MEDICATIONS: No current facility-administered medications on file prior to encounter.   Current Outpatient Medications on File Prior to Encounter  Medication Sig Dispense Refill  . acetaminophen (TYLENOL) 500 MG tablet Take 500-1,000 mg by mouth every 6 (six) hours as needed (for pain.).    Marland Kitchen diltiazem (CARDIZEM) 120 MG tablet Take 1 tablet (120 mg total) by mouth daily. 90 tablet 3  . ELIQUIS 5 MG TABS tablet Take 5 mg by mouth 2 (two) times daily.    . metoprolol succinate (TOPROL XL) 25 MG 24 hr tablet Take 1.5 tablets (37.5 mg total) by mouth daily. 135 tablet 1  . omeprazole (PRILOSEC) 20 MG capsule Take 20 mg by mouth 2 (two) times daily before a meal.        ALLERGIES:  Allergies  Allergen Reactions  . Codeine Sulfate [Codeine] Nausea And Vomiting  . Tetanus Toxoids Hives  . Horse-Derived Products Hives and Rash  . Sulfa Antibiotics Hives and Rash     FAMILY HISTORY:  Family History  Problem Relation Age of Onset  . Diabetes Mother   . Dementia Mother   . Other Mother        Had large benign ovarian mass removed surgically  . Arrhythmia Father   . Stroke Father   . Ovarian cancer Paternal Grandmother        Thinks that she died relatively young  . Breast cancer Paternal Aunt        Died in her 4s or 22s  . Colon cancer Neg Hx   . Uterine cancer Neg Hx   . Prostate cancer  Neg Hx      SOCIAL HISTORY:    Social Connections:   . Frequency of Communication with Friends and Family:   . Frequency of Social Gatherings with Friends and Family:   . Attends Religious Services:   . Active Member of Clubs or Organizations:   . Attends Archivist Meetings:   Marland Kitchen Marital Status:     REVIEW OF SYSTEMS:  Reports minor neuropathy, pain at night in her toes/ankles, fatigue, joint pain, abdominal pain Denies appetite changes, fevers, chills, unexplained weight changes. Denies hearing loss, neck lumps or masses, mouth sores, ringing in ears or voice changes. Denies cough or wheezing.  Denies shortness of breath. Denies chest pain or palpitations. Denies leg swelling. Denies abdominal distention, blood in stools, constipation, diarrhea, nausea, vomiting, or early satiety. Denies pain with intercourse, dysuria, frequency, hematuria or incontinence. Denies hot flashes, pelvic pain, vaginal bleedingor vaginal discharge.  Denies back pain or muscle pain/cramps. Denies itching, rash, or wounds. Denies dizziness, headaches, numbness or seizures. Denies swollen lymph nodes or glands, denies easy bruising or bleeding. Denies anxiety, depression, confusion, or decreased concentration.  Physical Exam:  Vital Signs for this  encounter:  Blood pressure 128/74, pulse 93, temperature 98.1 F (36.7 C), temperature source Oral, resp. rate 17, height 5' (1.524 m), weight 121 lb (54.9 kg), SpO2 100 %. Body mass index is 23.63 kg/m. General: Alert, oriented, no acute distress. HEENT: Normocephalic, atraumatic, sclera anicteric. Chest: Clear to auscultation bilaterally.  No wheezes or rhonchi. Cardiovascular: Regular rate and rhythm, no murmurs. Abdomen: soft, nontender.  Normoactive bowel sounds.  Overall decreased size and firmness of the omental cake in the left upper abdomen and left abdomen, less tension.  LABORATORY AND RADIOLOGIC DATA:  CT A/P on 5/5: 2.6 cm left adnexal mass in this patient with known ovarian cancer, previously 3.3 cm.  Improving peritoneal disease/omental caking, as above.  No abdominopelvic ascites.  Lab Results  Component Value Date   WBC 7.3 07/16/2019   HGB 10.3 (L) 07/16/2019   HCT 32.9 (L) 07/16/2019   PLT 190 07/16/2019   GLUCOSE 102 (H) 07/16/2019   ALT 16 07/16/2019   AST 26 07/16/2019   NA 138 07/16/2019   K 4.0 07/16/2019   CL 104 07/16/2019   CREATININE 0.73 07/16/2019   BUN 17 07/16/2019   CO2 24 07/16/2019   TSH 2.580 03/04/2019   INR 1.0 04/11/2019   Assessment/Plan:  Whitney Floyd is a 85 y.o. woman with metastatic carcinoma of GYN origin now status post 4 cycles of neoadjuvant chemotherapy with radiologic and tumor marker improvement presenting for interval debulking surgery.  Jeral Pinch, MD  Division of Gynecologic Oncology

## 2019-07-24 LAB — CBC
HCT: 22.4 % — ABNORMAL LOW (ref 36.0–46.0)
HCT: 22.4 % — ABNORMAL LOW (ref 36.0–46.0)
Hemoglobin: 7.3 g/dL — ABNORMAL LOW (ref 12.0–15.0)
Hemoglobin: 7.5 g/dL — ABNORMAL LOW (ref 12.0–15.0)
MCH: 30.4 pg (ref 26.0–34.0)
MCH: 31.3 pg (ref 26.0–34.0)
MCHC: 32.6 g/dL (ref 30.0–36.0)
MCHC: 33.5 g/dL (ref 30.0–36.0)
MCV: 93.3 fL (ref 80.0–100.0)
MCV: 93.3 fL (ref 80.0–100.0)
Platelets: 221 10*3/uL (ref 150–400)
Platelets: 230 10*3/uL (ref 150–400)
RBC: 2.4 MIL/uL — ABNORMAL LOW (ref 3.87–5.11)
RBC: 2.4 MIL/uL — ABNORMAL LOW (ref 3.87–5.11)
RDW: 18.2 % — ABNORMAL HIGH (ref 11.5–15.5)
RDW: 18.1 % — ABNORMAL HIGH (ref 11.5–15.5)
WBC: 8.4 10*3/uL (ref 4.0–10.5)
WBC: 9.5 10*3/uL (ref 4.0–10.5)
nRBC: 0 % (ref 0.0–0.2)
nRBC: 0 % (ref 0.0–0.2)

## 2019-07-24 LAB — BASIC METABOLIC PANEL
Anion gap: 9 (ref 5–15)
BUN: 10 mg/dL (ref 8–23)
CO2: 23 mmol/L (ref 22–32)
Calcium: 7.2 mg/dL — ABNORMAL LOW (ref 8.9–10.3)
Chloride: 103 mmol/L (ref 98–111)
Creatinine, Ser: 0.72 mg/dL (ref 0.44–1.00)
GFR calc Af Amer: >60 mL/min (ref >60)
GFR calc non Af Amer: >60 mL/min (ref >60)
Glucose, Bld: 245 mg/dL — ABNORMAL HIGH (ref 70–99)
Potassium: 4.1 mmol/L (ref 3.5–5.1)
Sodium: 135 mmol/L (ref 135–145)

## 2019-07-24 LAB — PREPARE RBC (CROSSMATCH)

## 2019-07-24 MED ORDER — SODIUM CHLORIDE 0.9% IV SOLUTION: Freq: Once | INTRAVENOUS | Status: DC

## 2019-07-24 MED ORDER — CHLORHEXIDINE GLUCONATE CLOTH 2 % EX PADS
6.0000 | MEDICATED_PAD | Freq: Every day | CUTANEOUS | Status: DC
  Administered 2019-07-24 – 2019-07-26 (×3): 6 via TOPICAL

## 2019-07-24 MED ORDER — ENOXAPARIN SODIUM 40 MG/0.4ML ~~LOC~~ SOLN
40.0000 mg | SUBCUTANEOUS | Status: DC
  Administered 2019-07-24 – 2019-07-27 (×4): 40 mg via SUBCUTANEOUS
  Filled 2019-07-24 (×4): qty 0.4

## 2019-07-24 NOTE — Progress Notes (Signed)
Notified by RN about patient's HR of 115.  Stat lab ordered per Dr. Berline Lopes.  Plan for blood transfusion of 1 unit PRBCs.

## 2019-07-24 NOTE — Progress Notes (Signed)
Initial Nutrition Assessment  DOCUMENTATION CODES:   Non-severe (moderate) malnutrition in context of chronic illness  INTERVENTION:   Once diet advanced: -Ensure Enlive po BID, each supplement provides 350 kcal and 20 grams of protein -Ensure MAX Protein po BID, each supplement provides 150 kcal and 30 grams of protein  NUTRITION DIAGNOSIS:   Moderate Malnutrition related to chronic illness, cancer and cancer related treatments as evidenced by energy intake < or equal to 75% for > or equal to 1 month, moderate muscle depletion, mild fat depletion.  GOAL:   Patient will meet greater than or equal to 90% of their needs  MONITOR:   Diet advancement, Labs, Weight trends, I & O's  REASON FOR ASSESSMENT:   Malnutrition Screening Tool    ASSESSMENT:   85 y.o. woman with metastatic carcinoma of GYN origin now status post 4 cycles of neoadjuvant chemotherapy with radiologic and tumor marker improvement presenting for interval debulking surgery.  6/15: s/p ex lap, omentectomy, SB resection  Patient in room with daughters at bedside. Pt reports feeling uncomfortable with NGT. Output at bedside ~200 ml. Per chart review, plan is for removal of NGT today and diet advancement to clears.  Pt reports that at home she was drinking Ensure, a combination of Ensure Max and Ensure Enlive for more protein with high kcals. RD to order this once diet is advanced. Currently denies issues with swallowing but feels like her mouth is dry and she may have painful swallowing following NGT removal.   Per weight records, pt has lost 4 lbs since 3/22, insignificant for time frame.  Labs reviewed. Medications: D5 infusion  NUTRITION - FOCUSED PHYSICAL EXAM:    Most Recent Value  Orbital Region Mild depletion  Upper Arm Region Mild depletion  Thoracic and Lumbar Region Unable to assess  Buccal Region Mild depletion  Temple Region Mild depletion  Clavicle Bone Region Moderate depletion  Clavicle  and Acromion Bone Region Mild depletion  Scapular Bone Region Mild depletion  Dorsal Hand Mild depletion  Patellar Region Unable to assess  Anterior Thigh Region Unable to assess  Posterior Calf Region Unable to assess  Hair Unable to assess  Eyes Reviewed  Skin Reviewed       Diet Order:   Diet Order            Diet clear liquid Room service appropriate? Yes; Fluid consistency: Thin  Diet effective now                 EDUCATION NEEDS:   No education needs have been identified at this time  Skin:  Skin Assessment: Reviewed RN Assessment  Last BM:  6/15  Height:   Ht Readings from Last 1 Encounters:  07/23/19 5' (1.524 m)    Weight:   Wt Readings from Last 1 Encounters:  07/23/19 54.9 kg    BMI:  Body mass index is 23.63 kg/m.  Estimated Nutritional Needs:   Kcal:  1650-1850  Protein:  80-95g  Fluid:  1.9L/day   Clayton Bibles, MS, RD, LDN Inpatient Clinical Dietitian Contact information available via Amion

## 2019-07-24 NOTE — Evaluation (Signed)
Physical Therapy Evaluation Patient Details Name: Whitney Floyd MRN: [id number] DOB: 1934-05-12 Today's Date: 07/24/2019   History of Present Illness  Diagnostic laparotomy with conversion to exploratory laparotomy, resection of large omental cake, small bowel resection and reanastomosis  Clinical Impression  Pt admitted with above diagnosis.  Pt feeling a little dizzy EOB however BP stable, pt agreeable to in room distance. Pt in bed soiled with NGT drainage   (pt not currently with NGT).  Bed pad wet. Pt coughing up phlegm, provided tissues which she did not have.   Instructed in use of IS (pt's first time using).   Will continue to follow pt in acute setting. Do not think she will need f/u PT post acute.  Pt currently with functional limitations due to the deficits listed below (see PT Problem List). Pt will benefit from skilled PT to increase their independence and safety with mobility to allow discharge to the venue listed below.       Follow Up Recommendations No PT follow up    Equipment Recommendations  None recommended by PT    Recommendations for Other Services       Precautions / Restrictions Precautions Precautions: Fall Restrictions Weight Bearing Restrictions: No      Mobility  Bed Mobility Overal bed mobility: Needs Assistance Bed Mobility: Supine to Sit     Supine to sit: Min guard     General bed mobility comments: for lines and safety  Transfers Overall transfer level: Needs assistance Equipment used: None Transfers: Sit to/from Stand Sit to Stand: Min guard         General transfer comment: cues for line awareness (2 IVs) and overall safety. dizzy with EOB, BP 134/72  Ambulation/Gait Ambulation/Gait assistance: Min guard Gait Distance (Feet): 10 Feet Assistive device: 1 person hand held assist;None Gait Pattern/deviations: Step-through pattern     General Gait Details: min/guard for safety and light steadying  Stairs             Wheelchair Mobility    Modified Rankin (Stroke Patients Only)       Balance                                             Pertinent Vitals/Pain Pain Assessment: 0-10 Pain Score: 5  Pain Location: abd Pain Descriptors / Indicators: Sore Pain Intervention(s): Limited activity within patient's tolerance;Monitored during session; called RN for meds    Home Living Family/patient expects to be discharged to:: Private residence Living Arrangements: Children (son "sleeps there") Available Help at Discharge: Family Type of Home: House       Home Layout: Two level;Able to live on main level with bedroom/bathroom Home Equipment: None      Prior Function Level of Independence: Independent               Hand Dominance        Extremity/Trunk Assessment   Upper Extremity Assessment Upper Extremity Assessment: Overall WFL for tasks assessed    Lower Extremity Assessment Lower Extremity Assessment: Generalized weakness       Communication   Communication: No difficulties  Cognition Arousal/Alertness: Awake/alert Behavior During Therapy: WFL for tasks assessed/performed Overall Cognitive Status: Within Functional Limits for tasks assessed  General Comments      Exercises     Assessment/Plan    PT Assessment Patient needs continued PT services  PT Problem List Decreased activity tolerance;Decreased mobility;Decreased balance;Decreased strength       PT Treatment Interventions DME instruction;Therapeutic exercise;Gait training;Functional mobility training;Therapeutic activities;Patient/family education    PT Goals (Current goals can be found in the Care Plan section)  Acute Rehab PT Goals Patient Stated Goal: home PT Goal Formulation: With patient Time For Goal Achievement: 07/31/19 Potential to Achieve Goals: Good    Frequency Min 3X/week   Barriers to discharge         Co-evaluation               AM-PAC PT "6 Clicks" Mobility  Outcome Measure Help needed turning from your back to your side while in a flat bed without using bedrails?: A Little Help needed moving from lying on your back to sitting on the side of a flat bed without using bedrails?: A Little Help needed moving to and from a bed to a chair (including a wheelchair)?: A Little Help needed standing up from a chair using your arms (e.g., wheelchair or bedside chair)?: A Little Help needed to walk in hospital room?: A Little Help needed climbing 3-5 steps with a railing? : A Little 6 Click Score: 18    End of Session   Activity Tolerance: Patient tolerated treatment well Patient left: with call bell/phone within reach;in chair;with family/visitor present   PT Visit Diagnosis: Other abnormalities of gait and mobility (R26.89)    Time: 6761-9509 PT Time Calculation (min) (ACUTE ONLY): 28 min   Charges:   PT Evaluation $PT Eval Low Complexity: 1 Low PT Treatments $Gait Training: 8-22 mins        Baxter Flattery, PT  Acute Rehab Dept ([place]) [phone] Pager [phone]  07/24/2019   [facility] 07/24/2019, 3:23 PM

## 2019-07-24 NOTE — Progress Notes (Addendum)
1 Day Post-Op Procedure(s) (LRB): EXPLORATORY LAPAROTOMY WITH OMENTECTOMY, SMALL BOWEL RESECTION WITH ANASTOMOSIS (N/A)  Subjective: Patient reports no significant pain overnight.  Pain controlled with PRN medications.  No nausea reported.  Reporting sore throat from NG tube.  Has not ambulated since surgery. Denies dizziness. Family at the bedside. No concerns voiced.  All questions answered by Dr. Berline Lopes.     Objective: Vital signs in last 24 hours: Temp:  [96.6 F (35.9 C)-98.1 F (36.7 C)] 98 F (36.7 C) (06/16 0859) Pulse Rate:  [64-115] 115 (06/16 0859) Resp:  [10-18] 17 (06/16 0859) BP: (105-136)/(59-91) 117/66 (06/16 0859) SpO2:  [95 %-100 %] 98 % (06/16 0859) Weight:  [121 lb (54.9 kg)] 121 lb (54.9 kg) (06/15 1128) Last BM Date: 07/23/19 (per pt)  Intake/Output from previous day: 06/15 0701 - 06/16 0700 In: 3898.3 [I.V.:3198.3; IV Piggyback:700] Out: 1230 [Urine:800; Emesis/NG output:80; Blood:350]  Physical Examination: General: alert, cooperative and no distress Resp: clear to auscultation bilaterally Cardio: mildly tachycardic GI: incision: midline incision with op site dressing in place with no active drainage noted underneath and abdomen soft, hypoactive bowel sounds, NG to intermittent suction Extremities: extremities normal, atraumatic, no cyanosis or edema  Foley in place with clear, yellow urine.  NG to suction with minimal amount in the canister.  Labs: WBC/Hgb/Hct/Plts:  8.4/7.3/22.4/221 (06/16 4854) BUN/Cr/glu/ALT/AST/amyl/lip:  10/0.72/--/--/--/--/-- (06/16 0536)  Assessment: 85 y.o. s/p Procedure(s): EXPLORATORY LAPAROTOMY WITH OMENTECTOMY, SMALL BOWEL RESECTION WITH ANASTOMOSIS: stable Pain:  Pain is well-controlled on PRN medications.  Heme: Hgb 7.3 and Hct 22.4 this am related to surgical losses and anemia present pre-operatively.  Hx of anemia.  Plan for repeat labs later this am with consideration of blood transfusion.    ID: WBC 8.4 this am. No  evidence of infection at this time.  CV: BP and HR stable. Continue to monitor.  Cardiac medications ordered.  GI:  Tolerating po: No: NPO with NG in place. Plan for removal later today with diet advancement.  GU: Adequate urine output noted in the foley bag. Continue to monitor.    FEN: No critical values this am.  Prophylaxis: SCDs. Will evaluate lovenox use after repeat CBC.  Plan: Repeat labs this am Foley removal Plan for NG removal later today with diet advancement Encourage IS use, deep breathing, and coughing Continue plan of care per Dr. Berline Lopes   LOS: 1 day    Whitney Floyd 07/24/2019, 9:19 AM

## 2019-07-25 ENCOUNTER — Encounter: Payer: Self-pay | Admitting: Hematology and Oncology

## 2019-07-25 ENCOUNTER — Encounter: Payer: Self-pay | Admitting: Oncology

## 2019-07-25 DIAGNOSIS — E440 Moderate protein-calorie malnutrition: Secondary | ICD-10-CM | POA: Insufficient documentation

## 2019-07-25 LAB — CBC
HCT: 24.7 % — ABNORMAL LOW (ref 36.0–46.0)
Hemoglobin: 8.1 g/dL — ABNORMAL LOW (ref 12.0–15.0)
MCH: 30.8 pg (ref 26.0–34.0)
MCHC: 32.8 g/dL (ref 30.0–36.0)
MCV: 93.9 fL (ref 80.0–100.0)
Platelets: 220 10*3/uL (ref 150–400)
RBC: 2.63 MIL/uL — ABNORMAL LOW (ref 3.87–5.11)
RDW: 17.5 % — ABNORMAL HIGH (ref 11.5–15.5)
WBC: 8 10*3/uL (ref 4.0–10.5)
nRBC: 0 % (ref 0.0–0.2)

## 2019-07-25 LAB — BASIC METABOLIC PANEL
Anion gap: 9 (ref 5–15)
BUN: 8 mg/dL (ref 8–23)
CO2: 24 mmol/L (ref 22–32)
Calcium: 7.3 mg/dL — ABNORMAL LOW (ref 8.9–10.3)
Chloride: 105 mmol/L (ref 98–111)
Creatinine, Ser: 0.66 mg/dL (ref 0.44–1.00)
GFR calc Af Amer: >60 mL/min (ref >60)
GFR calc non Af Amer: >60 mL/min (ref >60)
Glucose, Bld: 147 mg/dL — ABNORMAL HIGH (ref 70–99)
Potassium: 3.9 mmol/L (ref 3.5–5.1)
Sodium: 138 mmol/L (ref 135–145)

## 2019-07-25 MED ORDER — OXYCODONE HCL 5 MG PO TABS
5.0000 mg | ORAL_TABLET | Freq: Four times a day (QID) | ORAL | Status: DC | PRN
  Administered 2019-07-25 – 2019-07-26 (×2): 5 mg via ORAL
  Filled 2019-07-25 (×2): qty 1

## 2019-07-25 MED ORDER — ENSURE MAX PROTEIN PO LIQD: 11.0000 [oz_av] | Freq: Two times a day (BID) | ORAL | Status: DC

## 2019-07-25 MED ORDER — ENSURE ENLIVE PO LIQD
237.0000 mL | Freq: Two times a day (BID) | ORAL | Status: DC
  Administered 2019-07-25 – 2019-07-26 (×3): 237 mL via ORAL

## 2019-07-25 MED ORDER — TRAMADOL HCL 50 MG PO TABS
50.0000 mg | ORAL_TABLET | Freq: Four times a day (QID) | ORAL | Status: DC | PRN
  Administered 2019-07-25 – 2019-07-26 (×3): 50 mg via ORAL
  Filled 2019-07-25 (×3): qty 1

## 2019-07-25 NOTE — Progress Notes (Signed)
2 Days Post-Op Procedure(s) (LRB): EXPLORATORY LAPAROTOMY WITH OMENTECTOMY, SMALL BOWEL RESECTION WITH ANASTOMOSIS (N/A)  Subjective: Patient reports more incisional soreness this am. Pain controlled with PRN medications.  No nausea/emesis reported but has been belching frequently. Has passed small amount of flatus. Tolerated blueberry muffin and oatmeal this am. Denies dizziness. Voiding but states she is not aware when the stream begins and she had an episode of incontinence yesterday but this was present pre-op as well. Has ambulated to the bathroom. Family at the bedside. Patient asking about prognosis.   Objective: Vital signs in last 24 hours: Temp:  [97.7 F (36.5 C)-98.3 F (36.8 C)] 97.8 F (36.6 C) (06/17 0538) Pulse Rate:  [79-98] 98 (06/17 0538) Resp:  [15-20] 20 (06/17 0538) BP: (94-130)/(55-70) 130/70 (06/17 0538) SpO2:  [88 %-100 %] 100 % (06/17 0546) Last BM Date: 07/23/19 (per pt)  Intake/Output from previous day: 06/16 0701 - 06/17 0700 In: 1532.1 [P.O.:120; I.V.:1044.1; Blood:368] Out: 1350 [Urine:1200; Emesis/NG output:150]  Physical Examination: General: alert, cooperative and no distress Resp: clear to auscultation bilaterally Cardio: regular rate and rhythm GI: incision: midline incision with op site dressing in place with no active drainage noted underneath and abdomen more distended/rounded today, hypoactive bowel sounds, tympanic on percussion Extremities: extremities normal, atraumatic, no cyanosis or edema   Labs: WBC/Hgb/Hct/Plts:  8.0/8.1/24.7/220 (06/17 0510) BUN/Cr/glu/ALT/AST/amyl/lip:  8/0.66/--/--/--/--/-- (06/17 0510)  Assessment: 85 y.o. s/p Procedure(s): EXPLORATORY LAPAROTOMY WITH OMENTECTOMY, SMALL BOWEL RESECTION WITH ANASTOMOSIS: stable Pain:  Pain is well-controlled on PRN medications.  Heme: Hgb 8.1 and Hct 24.7 this am (stable) s/p transfusion of 1 unit RBCs on 07/24/2019. Anemia related to surgical losses and anemia present  pre-operatively.  ID: WBC 8.0 this am. No evidence of infection at this time.  CV: BP and HR stable. Continue to monitor.  Cardiac medications ordered.  GI:  Tolerating po: Yes. Delayed bowel function return-continue as tolerated but pt advised to not force foods and eat small items throughout the day.    GU: Adequate urine output noted. Creatinine 0.66. Continue to monitor.    FEN: No critical values this am.  Prophylaxis: SCDs. Lovenox injections started 07/24/2019.  Plan: PO pain medications ordered Continue with diet as tolerated unless nausea/emesis develop Encourage ambulation Encourage IS use, deep breathing, and coughing Continue plan of care per Dr. Berline Lopes   LOS: 2 days    Whitney Floyd 07/25/2019, 10:31 AM

## 2019-07-25 NOTE — Progress Notes (Signed)
PT Cancellation Note  Patient Details Name: Whitney Floyd MRN: [id number] DOB: [birth year]   Cancelled Treatment:     checked on her twice: am already walked 1/2 the unit with daughter                                                                                 Pm too much pain just getting back to bed from bathroom.  Pt stated she "walked twice today".   Nathanial Rancher 07/25/2019, 2:59 PM

## 2019-07-25 NOTE — Progress Notes (Signed)
   07/25/19 0900  Clinical Encounter Type  Visited With Patient not available  Visit Type Initial;Psychological support;Spiritual support  Referral From Nurse  Consult/Referral To Chaplain  Spiritual Encounters  Spiritual Needs Emotional;Other (Comment) (Spiritual Care Conversation/support)  Stress Factors  Patient Stress Factors Not reviewed    I attempted to visit with Whitney Floyd per spiritual care consult, but she was unavailable at this time.  I will follow up at a later time.   Please, contact Spiritual Care for further assistance.   Chaplain Shanon Ace M.Div., [facility]

## 2019-07-25 NOTE — TOC Transition Note (Signed)
Transition of Care [facility]) - CM/SW Discharge Note   Patient Details  Name: Whitney Floyd MRN: [id number] Date of Birth: 17-Nov-1934  [facility]) CM/SW Contact:  Lennart Pall, LCSW Phone Number: 07/25/2019, 9:37 AM   Clinical Narrative:  Met with pt and daughter to review potential d/c planning needs.  Pt very pleasant and confirms that she has all needed DME from other family members available to her.  Has 5 adult children and extended family in the area.  Daughter notes someone will stay with pt at d/c.  Both deny any concerns about dc support or any needs.  Please re-consult TOC if new needs identified.         Barriers to Discharge: No Barriers Identified   Patient Goals and CMS Choice Patient states their goals for this hospitalization and ongoing recovery are:: go home      Discharge Placement                       Discharge Plan and Services In-house Referral: Clinical Social Work   Post Acute Care Choice: NA          DME Arranged: N/A DME Agency: NA       HH Arranged: NA HH Agency: NA        Social Determinants of Health (SDOH) Interventions     Readmission Risk Interventions No flowsheet data found.

## 2019-07-25 NOTE — Addendum Note (Signed)
Addendum  created 07/25/19 0647 by West Pugh, CRNA   Flowsheet accepted, Intraprocedure Flowsheets edited

## 2019-07-25 NOTE — Progress Notes (Signed)
Met with Whitney Floyd on [place] her that Dr. Alvy Bimler would like to see her when she is out of the hospital and that an appointment has been scheduled for 07/31/19 at 1 pm (she can bring 2 family members). She verbalized understanding and agreement.

## 2019-07-26 LAB — CBC
HCT: 23.4 % — ABNORMAL LOW (ref 36.0–46.0)
HCT: 24.4 % — ABNORMAL LOW (ref 36.0–46.0)
Hemoglobin: 7.5 g/dL — ABNORMAL LOW (ref 12.0–15.0)
Hemoglobin: 7.9 g/dL — ABNORMAL LOW (ref 12.0–15.0)
MCH: 30.1 pg (ref 26.0–34.0)
MCH: 30.5 pg (ref 26.0–34.0)
MCHC: 32.1 g/dL (ref 30.0–36.0)
MCHC: 32.4 g/dL (ref 30.0–36.0)
MCV: 94 fL (ref 80.0–100.0)
MCV: 94.2 fL (ref 80.0–100.0)
Platelets: 225 10*3/uL (ref 150–400)
Platelets: 252 10*3/uL (ref 150–400)
RBC: 2.49 MIL/uL — ABNORMAL LOW (ref 3.87–5.11)
RBC: 2.59 MIL/uL — ABNORMAL LOW (ref 3.87–5.11)
RDW: 17.2 % — ABNORMAL HIGH (ref 11.5–15.5)
RDW: 17.2 % — ABNORMAL HIGH (ref 11.5–15.5)
WBC: 6.7 10*3/uL (ref 4.0–10.5)
WBC: 7.4 10*3/uL (ref 4.0–10.5)
nRBC: 0 % (ref 0.0–0.2)
nRBC: 0 % (ref 0.0–0.2)

## 2019-07-26 LAB — BASIC METABOLIC PANEL
Anion gap: 9 (ref 5–15)
BUN: 12 mg/dL (ref 8–23)
CO2: 25 mmol/L (ref 22–32)
Calcium: 7.7 mg/dL — ABNORMAL LOW (ref 8.9–10.3)
Chloride: 102 mmol/L (ref 98–111)
Creatinine, Ser: 0.54 mg/dL (ref 0.44–1.00)
GFR calc Af Amer: >60 mL/min (ref >60)
GFR calc non Af Amer: >60 mL/min (ref >60)
Glucose, Bld: 137 mg/dL — ABNORMAL HIGH (ref 70–99)
Potassium: 3.9 mmol/L (ref 3.5–5.1)
Sodium: 136 mmol/L (ref 135–145)

## 2019-07-26 MED ORDER — DOCUSATE SODIUM 100 MG PO CAPS
100.0000 mg | ORAL_CAPSULE | Freq: Two times a day (BID) | ORAL | Status: DC
  Administered 2019-07-26 – 2019-07-27 (×4): 100 mg via ORAL
  Filled 2019-07-26 (×3): qty 1

## 2019-07-26 MED ORDER — PANTOPRAZOLE SODIUM 40 MG PO TBEC
40.0000 mg | DELAYED_RELEASE_TABLET | Freq: Every day | ORAL | Status: DC
  Administered 2019-07-26 – 2019-07-27 (×2): 40 mg via ORAL
  Filled 2019-07-26 (×2): qty 1

## 2019-07-26 MED ORDER — ACETAMINOPHEN 325 MG PO TABS
650.0000 mg | ORAL_TABLET | Freq: Four times a day (QID) | ORAL | Status: DC | PRN
  Administered 2019-07-27 – 2019-07-28 (×3): 650 mg via ORAL
  Filled 2019-07-26 (×3): qty 2

## 2019-07-26 NOTE — Progress Notes (Signed)
3 Days Post-Op Procedure(s) (LRB): EXPLORATORY LAPAROTOMY WITH OMENTECTOMY, SMALL BOWEL RESECTION WITH ANASTOMOSIS (N/A)  Subjective: Patient reports overall doing well.  She has walked the hallway several times already today.  Was able to get some sleep overnight.  Denies any flatus or bowel movement but feels things rumbling and has had the urge to have a bowel movement.  Tolerating p.o. intake although not very hungry, denies any nausea or emesis.  Voiding without difficulty.    Objective: Vital signs in last 24 hours: Temp:  [98.2 F (36.8 C)-98.4 F (36.9 C)] 98.3 F (36.8 C) (06/18 0516) Pulse Rate:  [82-87] 86 (06/18 0516) Resp:  [15-17] 15 (06/18 0516) BP: (107-118)/(55-59) 118/55 (06/18 0516) SpO2:  [96 %-98 %] 96 % (06/18 0516) Last BM Date: 07/23/19 (per pt)  Intake/Output from previous day: 06/17 0701 - 06/18 0700 In: 720 [P.O.:720] Out: 1100 [Urine:1100]  Physical Examination: General: No acute distress, alert and laying in bed Cardiovascular: Regular rate and rhythm, no rubs or gallops appreciated Pulmonary: Lungs clear to auscultation bilaterally, breathing well on room air, no wheezes Abdomen: Moderately distended, increased bowel sounds from yesterday, mildly tympanic, mildly tender, no rebound or guarding.  Incisions clean dry and intact, honeycomb dressing in place over midline incision. Extremities: SCDs in place, no edema or calf tenderness to palpation  Labs: CBC Latest Ref Rng & Units 07/26/2019 07/26/2019 07/25/2019  WBC 4.0 - 10.5 K/uL 7.4 6.7 8.0  Hemoglobin 12.0 - 15.0 g/dL 7.9(L) 7.5(L) 8.1(L)  Hematocrit 36 - 46 % 24.4(L) 23.4(L) 24.7(L)  Platelets 150 - 400 K/uL 252 225 220   CMP Latest Ref Rng & Units 07/26/2019 07/25/2019 07/24/2019  Glucose 70 - 99 mg/dL 137(H) 147(H) 245(H)  BUN 8 - 23 mg/dL 12 8 10   Creatinine 0.44 - 1.00 mg/dL 0.54 0.66 0.72  Sodium 135 - 145 mmol/L 136 138 135  Potassium 3.5 - 5.1 mmol/L 3.9 3.9 4.1  Chloride 98 - 111 mmol/L  102 105 103  CO2 22 - 32 mmol/L 25 24 23   Calcium 8.9 - 10.3 mg/dL 7.7(L) 7.3(L) 7.2(L)  Total Protein 6.5 - 8.1 g/dL - - -  Total Bilirubin 0.3 - 1.2 mg/dL - - -  Alkaline Phos 38 - 126 U/L - - -  AST 15 - 41 U/L - - -  ALT 0 - 44 U/L - - -   Assessment:  85 y.o. s/p Procedure(s): EXPLORATORY LAPAROTOMY WITH OMENTECTOMY, SMALL BOWEL RESECTION WITH ANASTOMOSIS: stable, awaiting bowel function.  Postop: Meeting most milestones although awaiting return of bowel function after partial small bowel resection.  Patient has postoperative ileus, not surprising given degree of bowel manipulation.  She is still distended but she has some bowel sounds today and is feeling the urge to have a bowel movement.  We will continue regular diet and encourage ambulation.  Pain is well controlled on oral pain medications.  Acute on chronic anemia secondary to surgical blood loss: Patient was dizzy postoperative day 1, now status post 1 unit of packed red blood cells with an appropriate rise although decreased hemoglobin this morning.  Repeat hemoglobin late morning is stable just under 8.  We will continue to monitor.  Atrial fibrillation: Holding Eliquis in the setting of surgery.  We will plan to initiate on discharge.  Patient is on prophylactic Lovenox while in the hospital.  Discharge: With return of bowel function, anticipate later today or tomorrow.  Plan: Encourage ambulation Dispo:  Home with family The patient is to be  discharged to home.   LOS: 3 days    Whitney Floyd 07/26/2019, 11:42 AM

## 2019-07-26 NOTE — Progress Notes (Signed)
Patient alert, oriented, sitting on side of bed attempting to fasten binder prior to going on a walk.  States she feels she may need to have a BM so she wanted to walk to see if that would help.  Passing no flatus. Denies nausea, emesis, dizziness. Ambulating in the hall without assist without difficulty.  No concerns voiced.  Patient will be seen later today. Exam deferred due to current ambulation.

## 2019-07-26 NOTE — Progress Notes (Signed)
Saw patient again at the end of the day. Generally not feeling as well today. Had flatus this afternoon. Had episode of emesis after taking oxycodone and consuming of chocolate ensure. Denies nausea currently.   Discussed hydration and only eating if hungry. If has another episode of nausea will make NPO and start IVFs.  Jeral Pinch MD Gynecologic Oncology

## 2019-07-26 NOTE — Care Management Important Message (Signed)
Important Message  Patient Details IM Letter given to Velva Harman RN Case Manager to present to the Patient Name: CHARISSA KNOWLES MRN: [id number] Date of Birth: December 21, 1934   Medicare Important Message Given:  Yes     Kerin Salen 07/26/2019, 11:14 AM

## 2019-07-26 NOTE — Progress Notes (Signed)
Pharmacy IV to PO conversion  The patient is receiving pantoprazole by the intravenous route.  Based on criteria approved by the Pharmacy and [place], the medication is being converted to the equivalent oral dose form.   No active GI bleeding or impaired absorption  Not s/p esophagectomy  Documented ability to take oral medications for > 24 hr  Plan to continue treatment for at least 1 day  If you have any questions about this conversion, please contact the Pharmacy Department (ext [phone]).  Thank you.  Reuel Boom, PharmD, BCPS [phone] 07/26/2019, 12:40 PM

## 2019-07-27 LAB — TYPE AND SCREEN
ABO/RH(D): O POS
Antibody Screen: NEGATIVE
Unit division: 0
Unit division: 0
Unit division: 0
Unit division: 0

## 2019-07-27 LAB — BPAM RBC
Blood Product Expiration Date: 202107172359
Blood Product Expiration Date: 202107182359
Blood Product Expiration Date: 202107182359
Blood Product Expiration Date: 202107202359
ISSUE DATE / TIME: 202106161211
ISSUE DATE / TIME: 202106161341
ISSUE DATE / TIME: 202106171526
Unit Type and Rh: 5100
Unit Type and Rh: 5100
Unit Type and Rh: 5100
Unit Type and Rh: 5100

## 2019-07-27 LAB — BASIC METABOLIC PANEL
Anion gap: 7 (ref 5–15)
BUN: 17 mg/dL (ref 8–23)
CO2: 28 mmol/L (ref 22–32)
Calcium: 8.6 mg/dL — ABNORMAL LOW (ref 8.9–10.3)
Chloride: 99 mmol/L (ref 98–111)
Creatinine, Ser: 0.64 mg/dL (ref 0.44–1.00)
GFR calc Af Amer: >60 mL/min (ref >60)
GFR calc non Af Amer: >60 mL/min (ref >60)
Glucose, Bld: 134 mg/dL — ABNORMAL HIGH (ref 70–99)
Potassium: 4.3 mmol/L (ref 3.5–5.1)
Sodium: 134 mmol/L — ABNORMAL LOW (ref 135–145)

## 2019-07-27 LAB — CBC
HCT: 25.2 % — ABNORMAL LOW (ref 36.0–46.0)
Hemoglobin: 8.2 g/dL — ABNORMAL LOW (ref 12.0–15.0)
MCH: 30.5 pg (ref 26.0–34.0)
MCHC: 32.5 g/dL (ref 30.0–36.0)
MCV: 93.7 fL (ref 80.0–100.0)
Platelets: 303 10*3/uL (ref 150–400)
RBC: 2.69 MIL/uL — ABNORMAL LOW (ref 3.87–5.11)
RDW: 16.7 % — ABNORMAL HIGH (ref 11.5–15.5)
WBC: 8 10*3/uL (ref 4.0–10.5)
nRBC: 0 % (ref 0.0–0.2)

## 2019-07-27 NOTE — Progress Notes (Signed)
4 Days Post-Op Procedure(s) (LRB): EXPLORATORY LAPAROTOMY WITH OMENTECTOMY, SMALL BOWEL RESECTION WITH ANASTOMOSIS (N/A)  Subjective: Patient reports feeling better this morning. Several episodes of flatus overnight. Slept well. Voiding without difficulty. Tolerating Po intake, denies Nausea, no further episodes of emesis. Ambulating.  Objective: Vital signs in last 24 hours: Temp:  [97.7 F (36.5 C)-98.5 F (36.9 C)] 98.2 F (36.8 C) (06/19 0537) Pulse Rate:  [81-89] 81 (06/19 0537) Resp:  [16-20] 16 (06/19 0537) BP: (107-141)/(58-74) 110/60 (06/19 0537) SpO2:  [97 %-99 %] 97 % (06/19 0537) Last BM Date: 07/23/19 (per pt)  Intake/Output from previous day: 06/18 0701 - 06/19 0700 In: 900 [P.O.:900] Out: 2250 [Urine:2250]  Physical Examination: General: No acute distress, alert and laying in bed Cardiovascular: Regular rate and rhythm, no rubs or gallops appreciated Pulmonary: Lungs clear to auscultation bilaterally, breathing well on room air, no wheezes Abdomen: Mildly distended, normoactive BS, mildly tympanic, mildly tender, no rebound or guarding.  Incisions clean dry and intact, honeycomb dressing in place over midline incision. Extremities: no edema or calf tenderness to palpation  Labs: CBC Latest Ref Rng & Units 07/27/2019 07/26/2019 07/26/2019  WBC 4.0 - 10.5 K/uL 8.0 7.4 6.7  Hemoglobin 12.0 - 15.0 g/dL 8.2(L) 7.9(L) 7.5(L)  Hematocrit 36 - 46 % 25.2(L) 24.4(L) 23.4(L)  Platelets 150 - 400 K/uL 303 252 225   CMP Latest Ref Rng & Units 07/27/2019 07/26/2019 07/25/2019  Glucose 70 - 99 mg/dL 134(H) 137(H) 147(H)  BUN 8 - 23 mg/dL 17 12 8   Creatinine 0.44 - 1.00 mg/dL 0.64 0.54 0.66  Sodium 135 - 145 mmol/L 134(L) 136 138  Potassium 3.5 - 5.1 mmol/L 4.3 3.9 3.9  Chloride 98 - 111 mmol/L 99 102 105  CO2 22 - 32 mmol/L 28 25 24   Calcium 8.9 - 10.3 mg/dL 8.6(L) 7.7(L) 7.3(L)  Total Protein 6.5 - 8.1 g/dL - - -  Total Bilirubin 0.3 - 1.2 mg/dL - - -  Alkaline Phos 38 -  126 U/L - - -  AST 15 - 41 U/L - - -  ALT 0 - 44 U/L - - -   Pathology: A. 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  Immunohistochemical stains show that the tumor cells are positive  for PAX8, ER, WT1, CK 5/6 (focal) and negative for p63, consistent with  above interpretation.   Assessment:  85 y.o. s/p Procedure(s): EXPLORATORY LAPAROTOMY WITH OMENTECTOMY, SMALL BOWEL RESECTION WITH ANASTOMOSIS: stable, progressing.  Postop: Meeting most milestones, now with flatus, less distended and urge to have bowel movement. Ileus resolving. Continue regular diet. Awaiting bowel movement before discharge. We will continue regular diet and encourage ambulation.  Pain is well controlled on oral pain medications.  Acute on chronic anemia secondary to surgical blood loss: Patient was dizzy postoperative day 1, now status post 1 unit of packed red blood cells. Hgb now stable, patient asymptomatic.  Atrial fibrillation: Holding Eliquis in the setting of surgery.  We will plan to initiate on discharge.  Patient is on prophylactic Lovenox while in the hospital.  Pathology: discussed pathology yesterday with the patient and Butch Penny.  Discharge: With return of bowel function, anticipate later today.    LOS: 4 days  Lafonda Mosses 07/27/2019, 7:04 AM

## 2019-07-28 MED ORDER — DOCUSATE SODIUM 100 MG PO CAPS: 100.0000 mg | ORAL_CAPSULE | Freq: Two times a day (BID) | ORAL | 2 refills | Status: DC

## 2019-07-28 MED ORDER — APIXABAN 5 MG PO TABS
5.0000 mg | ORAL_TABLET | Freq: Two times a day (BID) | ORAL | Status: DC
  Administered 2019-07-28: 5 mg via ORAL
  Filled 2019-07-28: qty 1

## 2019-07-28 MED ORDER — TRAMADOL HCL 50 MG PO TABS: 50.0000 mg | ORAL_TABLET | Freq: Four times a day (QID) | ORAL | 0 refills | Status: AC | PRN

## 2019-07-28 NOTE — Progress Notes (Signed)
Nurse reviewed discharge instructions with pt.  Pt verbalized understanding of discharge instructions, follow up appointments and new medications.  No concerns at time of discharge.  Pt given her dose of eliquis prior to discharge per Dr. Charisse March request.

## 2019-07-28 NOTE — Discharge Summary (Signed)
Physician Discharge Summary  Patient ID: Whitney Floyd MRN: [id number] DOB/AGE: Aug 29, 1934 85 y.o.  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   Hospital Course: The patient was admitted on 6/15 after exploratory laparotomy with tumor debulking including omental cake resection and small bowel resection and reanastomosis. Her post-operative course was notable for development of an ileus which resolved by post-op day #4. Due to acute on chronic anemia secondary to surgical blood loss and dizziness post-op, she received 1u of pRBC with an appropriate rise in her Hgb subsequently. At the time of discharge, she was tolerating PO without nausea/emesis, voiding freely, having return of bowel function, and her pain was well controlled on oral medications. She is discharged to home in stable condition with follow-up with Dr. Alvy Bimler this week.  Consults: nutrition, physical therapy  Significant Diagnostic Studies: laboratory tests, pathology from surgery  Treatments: IV hydration, PO pain medications, 1u pRBC transfusion  Discharge Exam: Blood pressure 107/77, pulse 87, temperature 97.8 F (36.6 C), temperature source Oral, resp. rate 15, height 5' (1.524 m), weight 121 lb (54.9 kg), SpO2 98 %. General: No acute distress, alert and laying in bed Cardiovascular: Regular rate and rhythm, no rubs or gallops appreciated Pulmonary: Lungs clear to auscultation bilaterally, breathing well on room air, no wheezes Abdomen: Not distended, normoactive BS, no tenderness to palpation, no rebound or guarding. Incisions clean dry and intact, honeycomb dressing in place over midline incision. Extremities: no edema or  calf tenderness to palpation  Disposition:  Discharge disposition: 01-Home or Self Care       Discharge Instructions    Call MD for:   Complete by: As directed    Call MD for:  difficulty breathing, headache or visual disturbances   Complete by: As directed    Call MD for:  extreme fatigue   Complete by: As directed    Call MD for:  hives   Complete by: As directed    Call MD for:  persistant dizziness or light-headedness   Complete by: As directed    Call MD for:  persistant nausea and vomiting   Complete by: As directed    Call MD for:  redness, tenderness, or signs of infection (pain, swelling, redness, odor or green/yellow discharge around incision site)   Complete by: As directed    Call MD for:  severe uncontrolled pain   Complete by: As directed    Call MD for:  temperature >100.4   Complete by: As directed    Diet - low sodium heart healthy   Complete by: As directed    Discharge wound care:   Complete by: As directed    Remove honeycomb dressing today after shower. Dermabond (glue) will stay in place for another 5-10 days.   Increase activity slowly   Complete by: As directed      Allergies as of 07/28/2019      Reactions   Codeine Sulfate [codeine] Nausea And Vomiting   Tetanus Toxoids Hives   Horse-derived Products Hives, Rash   Sulfa Antibiotics Hives, Rash      Medication List    TAKE these medications   acetaminophen 500 MG tablet Commonly known as: TYLENOL Take 500-1,000 mg by mouth every 6 (six) hours as  needed (for pain.).   dexamethasone 4 MG tablet Commonly known as: DECADRON Take 2 tabs at the night before and 2 tab the morning of chemotherapy, every 3 weeks, by mouth   diltiazem 120 MG tablet Commonly known as: CARDIZEM Take 1 tablet (120 mg total) by mouth daily.   docusate sodium 100 MG capsule Commonly known as: COLACE Take 1 capsule (100 mg total) by mouth 2 (two) times daily.   Eliquis 5 MG Tabs tablet Generic drug:  apixaban Take 5 mg by mouth 2 (two) times daily.   metoprolol succinate 25 MG 24 hr tablet Commonly known as: Toprol XL Take 1.5 tablets (37.5 mg total) by mouth daily.   omeprazole 20 MG capsule Commonly known as: PRILOSEC Take 20 mg by mouth 2 (two) times daily before a meal.   traMADol 50 MG tablet Commonly known as: ULTRAM Take 1 tablet (50 mg total) by mouth every 6 (six) hours as needed for up to 20 days for moderate pain.            Discharge Care Instructions  (From admission, onward)         Start     Ordered   07/28/19 0000  Discharge wound care:       Comments: Remove honeycomb dressing today after shower. Dermabond (glue) will stay in place for another 5-10 days.   07/28/19 0749          Follow-up Information    Lafonda Mosses, MD In 1 week.   Specialty: Gynecologic Oncology Why: 3 week clinic visit Contact information: [place] [address] [phone]               Greater than thirty minutes were spend for face to face discharge instructions and discharge orders/summary in EPIC.   Signed: Lafonda Mosses 07/28/2019, 7:56 AM

## 2019-07-31 ENCOUNTER — Encounter: Payer: Self-pay | Admitting: Hematology and Oncology

## 2019-07-31 ENCOUNTER — Inpatient Hospital Stay: Payer: Medicare HMO | Attending: Gynecologic Oncology | Admitting: Hematology and Oncology

## 2019-07-31 ENCOUNTER — Other Ambulatory Visit: Payer: Self-pay

## 2019-07-31 ENCOUNTER — Encounter: Payer: Self-pay | Admitting: Oncology

## 2019-07-31 DIAGNOSIS — Z79899 Other long term (current) drug therapy: Secondary | ICD-10-CM | POA: Insufficient documentation

## 2019-07-31 DIAGNOSIS — Z7952 Long term (current) use of systemic steroids: Secondary | ICD-10-CM | POA: Diagnosis not present

## 2019-07-31 DIAGNOSIS — Z9221 Personal history of antineoplastic chemotherapy: Secondary | ICD-10-CM | POA: Insufficient documentation

## 2019-07-31 DIAGNOSIS — R188 Other ascites: Secondary | ICD-10-CM | POA: Diagnosis not present

## 2019-07-31 DIAGNOSIS — C786 Secondary malignant neoplasm of retroperitoneum and peritoneum: Secondary | ICD-10-CM | POA: Insufficient documentation

## 2019-07-31 DIAGNOSIS — Z7901 Long term (current) use of anticoagulants: Secondary | ICD-10-CM | POA: Diagnosis not present

## 2019-07-31 DIAGNOSIS — Z7189 Other specified counseling: Secondary | ICD-10-CM | POA: Diagnosis not present

## 2019-07-31 DIAGNOSIS — C562 Malignant neoplasm of left ovary: Secondary | ICD-10-CM | POA: Diagnosis not present

## 2019-07-31 NOTE — Progress Notes (Signed)
[place] OFFICE PROGRESS NOTE  Patient Care Team: Ernestene Kiel, MD as PCP - General (Internal Medicine) Berniece Salines, DO as PCP - Cardiology (Cardiology) Awanda Mink Craige Cotta, RN as Oncology Nurse Navigator (Oncology) Lafonda Mosses, MD as Consulting Physician (Gynecologic Oncology)  ASSESSMENT & PLAN:  Ovarian cancer, left [facility]) I have reviewed surgical report and pathology report I also discussed her case with her GYN surgeon She has significant tumor burden at the time of surgery which is an expected given significant positive response to treatment seen on blood work and CT imaging It is postulated that the patient may have platinum resistant disease and some of the slower growing tumor may not have responded well to her prior chemotherapy We discussed the current guidelines and options We discussed extensively about the risks, benefits, side effects of gemcitabine, Doxil plus minus bevacizumab and topotecan plus minus bevacizumab  The patient is undecided but did share with me that quality of life is very important If she were to attempt further chemotherapy, she is leaning towards single agent bevacizumab We discussed expected rate of response in the region of 20 to 25% with improvement in overall survival in general measured in months but not years if she happened to respond to therapy The typical timeframe of resumption of chemotherapy is in the region of 3 to 4 weeks after surgery I recommend she consider her options with family over the next few days and call me back next week when she made her decision about final plan  Goals of care, counseling/discussion We have extensive discussions about goals of care We discussed prognosis with or without chemotherapy   No orders of the defined types were placed in this encounter.   All questions were answered. The patient knows to call the clinic with any problems, questions or concerns. The total time spent in the  appointment was 45 minutes encounter with patients including review of chart and various tests results, discussions about plan of care and coordination of care plan   Heath Lark, MD 07/31/2019 2:05 PM  INTERVAL HISTORY: Please see below for problem oriented charting. She returns with multiple family members for further follow-up and review of plan of care The patient is disappointed that she could not have optimal debulking surgery She is recovering well from surgery She has persistent pain in the right lower quadrant region, well controlled with acetaminophen only She denies nausea or constipation She is eating well although she has lost some weight She has questions about life expectancy with or without treatment and treatment options available  SUMMARY OF ONCOLOGIC HISTORY: Oncology History Overview Note  Low grade serous carcinoma   Carcinomatosis ([place])  04/03/2019 Initial Diagnosis   Carcinomatosis ([place])   Ovarian cancer, left ([place])  02/07/2019 Imaging   Outside CT chest showed no evidence of metastatic disease   03/20/2019 Imaging   Outside Ct abdomen and pelvis showed diffuse carcinomatosis, left adnexa mass and ascites   04/11/2019 Pathology Results   FINAL MICROSCOPIC DIAGNOSIS:   A. OMENTUM, LEFT ABDOMINAL, NEEDLE CORE BIOPSY:  - Metastatic carcinoma.  See comment    COMMENT:   Immunohistochemical stains show that the tumor cells are positive for PAX8, ER, WT1, CK7 (focal) and CK 5/6 (patchy); and negative for p63, calretinin, D2-40, CK20, GATA3 and CDX2.  This mmunohistochemical profile is consistent with a gynecologic primary and suggestive of a low-grade serous carcinoma.   04/11/2019 Procedure   Image guided core biopsy of the omental disease. In addition,  small amount of ascites was collected for cytology   04/18/2019 Cancer Staging   Staging form: Ovary, Fallopian Tube, and Primary Peritoneal Carcinoma, AJCC 8th Edition - Clinical stage from 04/18/2019: FIGO Stage  IIIC (cT3c, cN0, cM0) - Signed by Heath Lark, MD on 04/18/2019   04/18/2019 Tumor Marker   Patient's tumor was tested for the following markers: CA-125 Results of the tumor marker test revealed 402   04/22/2019 - 06/24/2019 Chemotherapy   The patient had carboplatin and taxol x 4 for chemotherapy treatment.     05/13/2019 Tumor Marker   Patient's tumor was tested for the following markers: CA-125 Results of the tumor marker test revealed 146   06/12/2019 Imaging   2.6 cm left adnexal mass in this patient with known ovarian cancer, previously 3.3 cm.   Improving peritoneal disease/omental caking, as above.   No abdominopelvic ascites.   06/24/2019 Tumor Marker   Patient's tumor was tested for the following markers: CA-125 Results of the tumor marker test revealed 16.9   07/23/2019 Pathology Results   A. OMENTUM, EXCISION:  - Metastatic carcinoma to omentum, 29.6 cm, consistent with history of low-grade serous carcinoma   B. 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  On intra-abdominal entry, significant disease burden noted including large omental cake adherent to the anterior abdominal wall, extensive diaphragmatic disease (right greater than left), adhesions between the liver and the diaphragm, disease within the porta hepatis.  After  conversion to exploratory laparotomy, additional disease was noted on the stomach and significant disease burden on the sigmoid and rectum, both the colon and mesentery.  All of this disease was diffuse but small volume.  Additionally there were miliary implants along much of the abdominal peritoneum.  Uterus was 4-6 cm and normal in appearance.  Right fallopian tube and ovary atrophic and normal appearing, left ovary mildly enlarged and adherent to the sidewall with what appeared to be some tumor studding.  Small tumor implants were noted over much of the small bowel mesentery with 2 loops of small bowel adherent to the underside of the omental cake.  Appendix with tumor burden and adherent to the right sidewall.  Transverse colon significantly adherent to the omental cake which measured approximately 25 x 10 cm. Suboptimal resection due to burden of disease.  Given inability to resect, resection of tumor burden thought to be causing patient symptoms was performed     REVIEW OF SYSTEMS:   Constitutional: Denies fevers, chills or abnormal weight loss Eyes: Denies blurriness of vision Ears, nose, mouth, throat, and face: Denies mucositis or sore throat Respiratory: Denies cough, dyspnea or wheezes Cardiovascular: Denies palpitation, chest discomfort or lower extremity swelling Gastrointestinal:  Denies nausea, heartburn or change in bowel habits Skin: Denies abnormal skin rashes Lymphatics: Denies new lymphadenopathy or easy bruising Neurological:Denies numbness, tingling or new weaknesses Behavioral/Psych: Mood is stable, no new changes  All other systems were reviewed with  the patient and are negative.  I have reviewed the past medical history, past surgical history, social history and family history with the patient and they are unchanged from previous note.  ALLERGIES:  is allergic to codeine sulfate [codeine], tetanus toxoids, horse-derived products, and sulfa antibiotics.  MEDICATIONS:   Current Outpatient Medications  Medication Sig Dispense Refill   acetaminophen (TYLENOL) 500 MG tablet Take 500-1,000 mg by mouth every 6 (six) hours as needed (for pain.).     dexamethasone (DECADRON) 4 MG tablet Take 2 tabs at the night before and 2 tab the morning of chemotherapy, every 3 weeks, by mouth 24 tablet 6   diltiazem (CARDIZEM) 120 MG tablet Take 1 tablet (120 mg total) by mouth daily. 90 tablet 3   docusate sodium (COLACE) 100 MG capsule Take 1 capsule (100 mg total) by mouth 2 (two) times daily. 30 capsule 2   ELIQUIS 5 MG TABS tablet Take 5 mg by mouth 2 (two) times daily.     metoprolol succinate (TOPROL XL) 25 MG 24 hr tablet Take 1.5 tablets (37.5 mg total) by mouth daily. 135 tablet 1   omeprazole (PRILOSEC) 20 MG capsule Take 20 mg by mouth 2 (two) times daily before a meal.      traMADol (ULTRAM) 50 MG tablet Take 1 tablet (50 mg total) by mouth every 6 (six) hours as needed for up to 20 days for moderate pain. 20 tablet 0   No current facility-administered medications for this visit.    PHYSICAL EXAMINATION: ECOG PERFORMANCE STATUS: 1 - Symptomatic but completely ambulatory  Vitals:   07/31/19 1319  BP: (!) 111/56  Pulse: 84  Resp: 18  Temp: 98.6 F (37 C)  SpO2: 100%   Filed Weights   07/31/19 1319  Weight: 118 lb 3.2 oz (53.6 kg)    GENERAL:alert, no distress and comfortable NEURO: alert & oriented x 3 with fluent speech, no focal motor/sensory deficits  LABORATORY DATA:  I have reviewed the data as listed    Component Value Date/Time   NA 134 (L) 07/27/2019 0537   NA 137 03/04/2019 1107   K 4.3 07/27/2019 0537   CL 99 07/27/2019 0537   CO2 28 07/27/2019 0537   GLUCOSE 134 (H) 07/27/2019 0537   BUN 17 07/27/2019 0537   BUN 11 03/04/2019 1107   CREATININE 0.64 07/27/2019 0537   CREATININE 0.93 06/24/2019 0859   CALCIUM 8.6 (L) 07/27/2019 0537   PROT 7.8 07/16/2019 1302   PROT 7.4 03/04/2019 1107   ALBUMIN 3.7 07/16/2019 1302    ALBUMIN 3.6 03/04/2019 1107   AST 26 07/16/2019 1302   AST 21 06/24/2019 0859   ALT 16 07/16/2019 1302   ALT 17 06/24/2019 0859   ALKPHOS 61 07/16/2019 1302   BILITOT 0.3 07/16/2019 1302   BILITOT <0.2 (L) 06/24/2019 0859   GFRNONAA >60 07/27/2019 0537   GFRNONAA 57 (L) 06/24/2019 0859   GFRAA >60 07/27/2019 0537   GFRAA >60 06/24/2019 0859    No results found for: SPEP, UPEP  Lab Results  Component Value Date   WBC 8.0 07/27/2019   NEUTROABS 4.5 07/16/2019   HGB 8.2 (L) 07/27/2019   HCT 25.2 (L) 07/27/2019   MCV 93.7 07/27/2019   PLT 303 07/27/2019      Chemistry      Component Value Date/Time   NA 134 (L) 07/27/2019 0537   NA 137 03/04/2019 1107   K 4.3 07/27/2019 0537   CL 99 07/27/2019 0537  CO2 28 07/27/2019 0537   BUN 17 07/27/2019 0537   BUN 11 03/04/2019 1107   CREATININE 0.64 07/27/2019 0537   CREATININE 0.93 06/24/2019 0859      Component Value Date/Time   CALCIUM 8.6 (L) 07/27/2019 0537   ALKPHOS 61 07/16/2019 1302   AST 26 07/16/2019 1302   AST 21 06/24/2019 0859   ALT 16 07/16/2019 1302   ALT 17 06/24/2019 0859   BILITOT 0.3 07/16/2019 1302   BILITOT <0.2 (L) 06/24/2019 7858

## 2019-07-31 NOTE — Assessment & Plan Note (Signed)
We have extensive discussions about goals of care We discussed prognosis with or without chemotherapy

## 2019-07-31 NOTE — Progress Notes (Signed)
Requested ER/PR, dMMR on accession [id number] with [facility] Pathology via email.

## 2019-07-31 NOTE — Assessment & Plan Note (Signed)
I have reviewed surgical report and pathology report I also discussed her case with her GYN surgeon She has significant tumor burden at the time of surgery which is an expected given significant positive response to treatment seen on blood work and CT imaging It is postulated that the patient may have platinum resistant disease and some of the slower growing tumor may not have responded well to her prior chemotherapy We discussed the current guidelines and options We discussed extensively about the risks, benefits, side effects of gemcitabine, Doxil plus minus bevacizumab and topotecan plus minus bevacizumab  The patient is undecided but did share with me that quality of life is very important If she were to attempt further chemotherapy, she is leaning towards single agent bevacizumab We discussed expected rate of response in the region of 20 to 25% with improvement in overall survival in general measured in months but not years if she happened to respond to therapy The typical timeframe of resumption of chemotherapy is in the region of 3 to 4 weeks after surgery I recommend she consider her options with family over the next few days and call me back next week when she made her decision about final plan

## 2019-08-01 ENCOUNTER — Encounter: Payer: Self-pay | Admitting: Hematology and Oncology

## 2019-08-02 ENCOUNTER — Encounter: Payer: Self-pay | Admitting: Hematology and Oncology

## 2019-08-02 ENCOUNTER — Telehealth: Payer: Self-pay | Admitting: Hematology and Oncology

## 2019-08-02 ENCOUNTER — Other Ambulatory Visit: Payer: Self-pay | Admitting: Hematology and Oncology

## 2019-08-02 ENCOUNTER — Telehealth: Payer: Self-pay | Admitting: Oncology

## 2019-08-02 DIAGNOSIS — C562 Malignant neoplasm of left ovary: Secondary | ICD-10-CM

## 2019-08-02 NOTE — Telephone Encounter (Signed)
Scheduled per 6/25 sch message. Pt is aware of appt times and dates.

## 2019-08-02 NOTE — Telephone Encounter (Signed)
Called Whitney Floyd and advised her of CT scan appointment on 08/20/19 at 10:30 am (10:15 arrival, NPO 4 hours prior, pick up contrast at the [facility]). She verbalized understanding and agreement.

## 2019-08-02 NOTE — Progress Notes (Signed)
DISCONTINUE ON PATHWAY REGIMEN - Ovarian     A cycle is every 21 days:     Paclitaxel      Carboplatin   **Always confirm dose/schedule in your pharmacy ordering system**  REASON: Disease Progression PRIOR TREATMENT: [id number]: Carboplatin AUC=6 + Paclitaxel 175 mg/m2 q21 Days x 2-4 Cycles TREATMENT RESPONSE: Progressive Disease (PD)  START OFF PATHWAY REGIMEN - Ovarian   [id number]:Gemcitabine 1,000 mg/m2 D1, 8  q21 Days:   A cycle is every 21 days:     Gemcitabine   **Always confirm dose/schedule in your pharmacy ordering system**  Patient Characteristics: Recurrent or Progressive Disease, Second Line, Progression on Neoadjuvant Therapy Therapeutic Status: Recurrent or Progressive Disease BRCA Mutation Status: Awaiting Test Results Line of Therapy: Second Line  Intent of Therapy: Non-Curative / Palliative Intent, Discussed with Patient

## 2019-08-02 NOTE — Telephone Encounter (Signed)
Left a message regarding CT scan appointment. Requested a return call.

## 2019-08-05 ENCOUNTER — Telehealth: Payer: Self-pay | Admitting: Oncology

## 2019-08-05 NOTE — Telephone Encounter (Signed)
Called Whitney Floyd regarding genetic counseling.  She would like to think about it and discuss it with her family.  She will call back with her decision.

## 2019-08-08 LAB — SURGICAL PATHOLOGY

## 2019-08-13 ENCOUNTER — Other Ambulatory Visit: Payer: Self-pay | Admitting: Hematology and Oncology

## 2019-08-13 DIAGNOSIS — D61818 Other pancytopenia: Secondary | ICD-10-CM

## 2019-08-15 ENCOUNTER — Other Ambulatory Visit: Payer: Self-pay

## 2019-08-15 ENCOUNTER — Inpatient Hospital Stay: Payer: Medicare HMO | Attending: Gynecologic Oncology | Admitting: Gynecologic Oncology

## 2019-08-15 ENCOUNTER — Encounter: Payer: Self-pay | Admitting: Gynecologic Oncology

## 2019-08-15 VITALS — BP 124/61 | HR 85 | Temp 98.3°F | Resp 18 | Ht 60.0 in | Wt 121.0 lb

## 2019-08-15 DIAGNOSIS — I480 Paroxysmal atrial fibrillation: Secondary | ICD-10-CM | POA: Insufficient documentation

## 2019-08-15 DIAGNOSIS — M1990 Unspecified osteoarthritis, unspecified site: Secondary | ICD-10-CM | POA: Insufficient documentation

## 2019-08-15 DIAGNOSIS — Z5111 Encounter for antineoplastic chemotherapy: Secondary | ICD-10-CM | POA: Insufficient documentation

## 2019-08-15 DIAGNOSIS — E440 Moderate protein-calorie malnutrition: Secondary | ICD-10-CM | POA: Insufficient documentation

## 2019-08-15 DIAGNOSIS — Z79899 Other long term (current) drug therapy: Secondary | ICD-10-CM | POA: Insufficient documentation

## 2019-08-15 DIAGNOSIS — C562 Malignant neoplasm of left ovary: Secondary | ICD-10-CM | POA: Insufficient documentation

## 2019-08-15 DIAGNOSIS — I700 Atherosclerosis of aorta: Secondary | ICD-10-CM | POA: Insufficient documentation

## 2019-08-15 DIAGNOSIS — K219 Gastro-esophageal reflux disease without esophagitis: Secondary | ICD-10-CM | POA: Insufficient documentation

## 2019-08-15 DIAGNOSIS — Z9889 Other specified postprocedural states: Secondary | ICD-10-CM

## 2019-08-15 DIAGNOSIS — R6881 Early satiety: Secondary | ICD-10-CM | POA: Insufficient documentation

## 2019-08-15 DIAGNOSIS — C786 Secondary malignant neoplasm of retroperitoneum and peritoneum: Secondary | ICD-10-CM

## 2019-08-15 DIAGNOSIS — Z7901 Long term (current) use of anticoagulants: Secondary | ICD-10-CM | POA: Insufficient documentation

## 2019-08-15 DIAGNOSIS — G893 Neoplasm related pain (acute) (chronic): Secondary | ICD-10-CM | POA: Insufficient documentation

## 2019-08-15 DIAGNOSIS — M8580 Other specified disorders of bone density and structure, unspecified site: Secondary | ICD-10-CM | POA: Insufficient documentation

## 2019-08-15 DIAGNOSIS — E785 Hyperlipidemia, unspecified: Secondary | ICD-10-CM | POA: Insufficient documentation

## 2019-08-15 NOTE — Progress Notes (Signed)
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    Ovarian cancer, left ([place])  02/07/2019 Imaging   Outside CT chest showed no evidence of metastatic disease   03/20/2019 Imaging   Outside Ct abdomen and pelvis showed diffuse carcinomatosis, left adnexa mass and ascites   04/11/2019 Pathology Results   FINAL MICROSCOPIC DIAGNOSIS:   A. OMENTUM, LEFT ABDOMINAL, NEEDLE CORE BIOPSY:  - Metastatic carcinoma.  See comment    COMMENT:   Immunohistochemical stains show that the tumor cells are positive for PAX8, ER, WT1, CK7 (focal) and CK 5/6 (patchy); and negative for p63, calretinin, D2-40, CK20, GATA3 and CDX2.  This mmunohistochemical profile is consistent with a gynecologic primary and suggestive of a low-grade serous carcinoma.   04/11/2019 Procedure   Image guided core biopsy of the omental disease. In addition, small amount of ascites was collected for cytology   04/18/2019 Cancer Staging   Staging form: Ovary, Fallopian Tube, and Primary Peritoneal Carcinoma, AJCC 8th Edition - Clinical stage from 04/18/2019: FIGO Stage IIIC (cT3c, cN0, cM0) - Signed by Heath Lark, MD on 04/18/2019   04/18/2019 Tumor Marker   Patient's tumor was tested for the following markers: CA-125 Results of the tumor marker test revealed 402   04/22/2019 - 06/24/2019 Chemotherapy   The patient had carboplatin and taxol x 4 for chemotherapy treatment.      05/13/2019 Tumor Marker   Patient's tumor was tested for the following markers: CA-125 Results of the tumor marker test revealed 146   06/12/2019 Imaging   2.6 cm left adnexal mass in this patient with known ovarian cancer, previously 3.3 cm.   Improving peritoneal disease/omental caking, as above.   No abdominopelvic ascites.   06/24/2019 Tumor Marker   Patient's tumor was tested for the following markers: CA-125 Results of the tumor marker test revealed 16.9   07/23/2019 Pathology Results   A. OMENTUM, EXCISION:  - Metastatic carcinoma to omentum, 29.6 cm, consistent with history of low-grade serous carcinoma   B. 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  On intra-abdominal entry, significant disease burden noted including large omental cake adherent to the anterior abdominal wall, extensive diaphragmatic disease (right greater than left), adhesions between the liver and the diaphragm, disease within the porta hepatis.  After conversion to exploratory laparotomy, additional disease was noted on the stomach and significant disease burden on the sigmoid and rectum, both the colon and mesentery.  All of this disease was diffuse but  small volume.  Additionally there were miliary implants along much of the abdominal peritoneum.  Uterus was 4-6 cm and normal in appearance.  Right fallopian tube and ovary atrophic and normal appearing, left ovary mildly enlarged and adherent to the sidewall with what appeared to be some tumor studding.  Small tumor implants were noted over much of the small bowel mesentery with 2 loops of small bowel adherent to the underside of the omental cake.  Appendix with tumor burden and adherent to the right sidewall.  Transverse colon significantly adherent to the omental cake which measured approximately 25 x 10 cm. Suboptimal resection due to burden of disease.  Given inability to resect, resection of tumor burden thought to be causing patient symptoms was performed   08/21/2019 -  Chemotherapy   The patient had gemcitabine (GEMZAR) 1,216 mg in sodium chloride 0.9 % 250 mL chemo infusion, 800 mg/m2 = 1,216 mg (100 % of original dose 800 mg/m2), Intravenous,  Once, 0 of 4 cycles Dose modification: 800 mg/m2 (original dose 800 mg/m2, Cycle 1, Reason: Patient Age)  for chemotherapy treatment.     Genetic Testing   Patient has genetic testing done for MMR. Results revealed patient has the following: MMR - normal     Interval History: Patient reports overall doing well since surgery.  She denies any fevers or chills.  She has had several days of very minimal brown-tinged discharge, otherwise denies vaginal bleeding or discharge.  She continues to have early satiety but notes that her appetite has improved since surgery.  Her bowel function has returned to normal.  She denies any urinary symptoms.  She continues to have intermittent right upper quadrant pain that she describes as sharp stabbing and makes her catch her breath.  This tends to occur when she takes a big breath and lasts only momentarily.  She continues to endorse some residual neuropathy in her feet.  Past Medical/Surgical History: Past Medical  History:  Diagnosis Date  . Anemia   . Aortic atherosclerosis ([place])   . Arthritis   . Atrial fibrillation ([place])   . 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 Carcinomatosis ([place])    ovarian  . Cataract   . Diverticulosis   . Dysrhythmia    a fib  . GERD (gastroesophageal reflux disease)   . Headache    hx of migraines  . Hyperlipidemia   . Osteopenia   . Paroxysmal A-fib ([place]) 02/08/2019  . Peptic ulcer disease     Past Surgical History:  Procedure Laterality Date  . benign bone tumor removal    . CATARACT EXTRACTION, BILATERAL    . COLONOSCOPY    . TUBAL LIGATION      Family History  Problem Relation Age of Onset  . Diabetes Mother   . Dementia Mother   . Other Mother        Had large benign ovarian mass removed surgically  . Arrhythmia Father   . Stroke Father   . Ovarian cancer Paternal Grandmother        Thinks that she died relatively young  . Breast cancer Paternal Aunt        Died in her 92s or 2s  . Colon cancer Neg Hx   . Uterine cancer Neg Hx   .  Prostate cancer Neg Hx     Social History   Socioeconomic History  . Marital status: Divorced    Spouse name: Not on file  . Number of children: 6  . Years of education: Not on file  . Highest education level: Not on file  Occupational History  . Occupation: retired Sales executive  Tobacco Use  . Smoking status: Never Smoker  . Smokeless tobacco: Never Used  Vaping Use  . Vaping Use: Never used  Substance and Sexual Activity  . Alcohol use: Never  . Drug use: Never  . Sexual activity: Not Currently  Other Topics Concern  . Not on file  Social History Narrative   Lived with son, Quita Skye   Social Determinants of Health   Financial Resource Strain:   . Difficulty of Paying Living Expenses:   Food Insecurity:   . Worried About Charity fundraiser in the Last Year:   . Arboriculturist in the Last Year:   Transportation Needs:   . Film/video editor (Medical):   Marland Kitchen Lack of Transportation  (Non-Medical):   Physical Activity:   . Days of Exercise per Week:   . Minutes of Exercise per Session:   Stress:   . Feeling of Stress :   Social Connections:   . Frequency of Communication with Friends and Family:   . Frequency of Social Gatherings with Friends and Family:   . Attends Religious Services:   . Active Member of Clubs or Organizations:   . Attends Archivist Meetings:   Marland Kitchen Marital Status:     Current Medications:  Current Outpatient Medications:  .  acetaminophen (TYLENOL) 500 MG tablet, Take 500-1,000 mg by mouth every 6 (six) hours as needed (for pain.)., Disp: , Rfl:  .  dexamethasone (DECADRON) 4 MG tablet, Take 2 tabs at the night before and 2 tab the morning of chemotherapy, every 3 weeks, by mouth, Disp: 24 tablet, Rfl: 6 .  diltiazem (CARDIZEM) 120 MG tablet, Take 1 tablet (120 mg total) by mouth daily., Disp: 90 tablet, Rfl: 3 .  docusate sodium (COLACE) 100 MG capsule, Take 1 capsule (100 mg total) by mouth 2 (two) times daily., Disp: 30 capsule, Rfl: 2 .  ELIQUIS 5 MG TABS tablet, Take 5 mg by mouth 2 (two) times daily., Disp: , Rfl:  .  metoprolol succinate (TOPROL XL) 25 MG 24 hr tablet, Take 1.5 tablets (37.5 mg total) by mouth daily., Disp: 135 tablet, Rfl: 1 .  omeprazole (PRILOSEC) 20 MG capsule, Take 20 mg by mouth 2 (two) times daily before a meal. , Disp: , Rfl:  .  traMADol (ULTRAM) 50 MG tablet, Take 1 tablet (50 mg total) by mouth every 6 (six) hours as needed for up to 20 days for moderate pain., Disp: 20 tablet, Rfl: 0  Review of Systems: Pertinent positives include fatigue, slowly improving over the last 5 days, decreased appetite, hearing loss, abdominal pain, vaginal discharge, and neuropathy Denies fevers, chills, unexplained weight changes. Denies neck lumps or masses, mouth sores, ringing in ears or voice changes. Denies cough or wheezing.  Denies shortness of breath. Denies chest pain or palpitations. Denies leg swelling. Denies  abdominal distention, blood in stools, constipation, diarrhea, nausea, vomiting, or early satiety. Denies pain with intercourse, dysuria, frequency, hematuria or incontinence. Denies hot flashes, pelvic pain, vaginal bleeding.   Denies joint pain, back pain or muscle pain/cramps. Denies itching, rash, or wounds. Denies dizziness, headaches, or seizures. Denies swollen lymph  nodes or glands, denies easy bruising or bleeding. Denies anxiety, depression, confusion, or decreased concentration.  Physical Exam: BP 124/61 (BP Location: Left Arm, Patient Position: Sitting)   Pulse 85   Temp 98.3 F (36.8 C) (Oral)   Resp 18   Ht 5' (1.524 m)   Wt 121 lb (54.9 kg)   SpO2 99%   BMI 23.63 kg/m  General: Alert, oriented, no acute distress. HEENT: Normocephalic, atraumatic, sclera anicteric. Chest: Unlabored breathing on room air. Abdomen: soft, nontender.  Normoactive bowel sounds.  No masses or hepatosplenomegaly appreciated.  Well-healing incision, no erythema, induration or exudate. Extremities: Grossly normal range of motion.  Warm, well perfused.  No edema bilaterally.  Laboratory & Radiologic Studies: None new  Assessment & Plan: Whitney Floyd is a 85 y.o. 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  Overall, the patient has healed well from surgery.  She notes significant improvement in her abdominal symptoms with resection of her large omental cake.  Her initial postoperative course was complicated by a postoperative ileus in the setting of significant bowel manipulation.  This has completely resolved and she is having normal bowel function.  The patient is scheduled for a CT scan to assess her disease burden next week followed by a visit with Dr. Alvy Bimler.  She has decided to proceed with chemotherapy, and I suspect given her  description that she will proceed with Gemzar.  We discussed again the reason for genetic testing.  The patient had initially been somewhat hesitant about this.  Today, on further exploration, she seems most concerned about additional blood that would need to be drawn for testing.  I have encouraged her to think again about meeting with a counselor and having the testing performed.  20 minutes of total time was spent for this patient encounter, including preparation, face-to-face counseling with the patient and coordination of care, and documentation of the encounter.  Jeral Pinch, MD  Division of Gynecologic Oncology  Department of Obstetrics and Gynecology  [facility]

## 2019-08-15 NOTE — Patient Instructions (Signed)
You are healing great from surgery!  I will be on the look out for your CT next week.  Let me know if you need anything as you change chemotherapy.

## 2019-08-20 ENCOUNTER — Other Ambulatory Visit: Payer: Medicare HMO

## 2019-08-20 ENCOUNTER — Inpatient Hospital Stay: Payer: Medicare HMO

## 2019-08-20 ENCOUNTER — Encounter (HOSPITAL_COMMUNITY): Payer: Self-pay

## 2019-08-20 ENCOUNTER — Other Ambulatory Visit: Payer: Self-pay

## 2019-08-20 ENCOUNTER — Ambulatory Visit (HOSPITAL_COMMUNITY)
Admission: RE | Admit: 2019-08-20 | Discharge: 2019-08-20 | Disposition: A | Discharge status: Home / Self Care | Payer: Medicare HMO | Source: Ambulatory Visit | Attending: Hematology and Oncology | Admitting: Hematology and Oncology

## 2019-08-20 DIAGNOSIS — R971 Elevated cancer antigen 125 [CA 125]: Secondary | ICD-10-CM | POA: Insufficient documentation

## 2019-08-20 DIAGNOSIS — C562 Malignant neoplasm of left ovary: Secondary | ICD-10-CM

## 2019-08-20 DIAGNOSIS — C800 Disseminated malignant neoplasm, unspecified: Secondary | ICD-10-CM

## 2019-08-20 DIAGNOSIS — M8580 Other specified disorders of bone density and structure, unspecified site: Secondary | ICD-10-CM | POA: Diagnosis not present

## 2019-08-20 DIAGNOSIS — G893 Neoplasm related pain (acute) (chronic): Secondary | ICD-10-CM | POA: Diagnosis not present

## 2019-08-20 DIAGNOSIS — Z5111 Encounter for antineoplastic chemotherapy: Secondary | ICD-10-CM | POA: Diagnosis present

## 2019-08-20 DIAGNOSIS — C786 Secondary malignant neoplasm of retroperitoneum and peritoneum: Secondary | ICD-10-CM | POA: Diagnosis present

## 2019-08-20 DIAGNOSIS — E785 Hyperlipidemia, unspecified: Secondary | ICD-10-CM | POA: Diagnosis not present

## 2019-08-20 DIAGNOSIS — Z7189 Other specified counseling: Secondary | ICD-10-CM

## 2019-08-20 DIAGNOSIS — I480 Paroxysmal atrial fibrillation: Secondary | ICD-10-CM | POA: Diagnosis not present

## 2019-08-20 DIAGNOSIS — I700 Atherosclerosis of aorta: Secondary | ICD-10-CM | POA: Diagnosis not present

## 2019-08-20 DIAGNOSIS — N838 Other noninflammatory disorders of ovary, fallopian tube and broad ligament: Secondary | ICD-10-CM | POA: Diagnosis present

## 2019-08-20 DIAGNOSIS — C7800 Secondary malignant neoplasm of unspecified lung: Secondary | ICD-10-CM | POA: Diagnosis not present

## 2019-08-20 DIAGNOSIS — R6881 Early satiety: Secondary | ICD-10-CM | POA: Diagnosis not present

## 2019-08-20 DIAGNOSIS — D61818 Other pancytopenia: Secondary | ICD-10-CM

## 2019-08-20 DIAGNOSIS — K219 Gastro-esophageal reflux disease without esophagitis: Secondary | ICD-10-CM | POA: Diagnosis not present

## 2019-08-20 DIAGNOSIS — M1990 Unspecified osteoarthritis, unspecified site: Secondary | ICD-10-CM | POA: Diagnosis not present

## 2019-08-20 DIAGNOSIS — Z7901 Long term (current) use of anticoagulants: Secondary | ICD-10-CM | POA: Diagnosis not present

## 2019-08-20 DIAGNOSIS — Z79899 Other long term (current) drug therapy: Secondary | ICD-10-CM | POA: Diagnosis not present

## 2019-08-20 DIAGNOSIS — K661 Hemoperitoneum: Secondary | ICD-10-CM | POA: Diagnosis not present

## 2019-08-20 DIAGNOSIS — Z9889 Other specified postprocedural states: Secondary | ICD-10-CM | POA: Diagnosis not present

## 2019-08-20 DIAGNOSIS — E440 Moderate protein-calorie malnutrition: Secondary | ICD-10-CM | POA: Diagnosis not present

## 2019-08-20 LAB — CBC WITH DIFFERENTIAL (CANCER CENTER ONLY)
Abs Immature Granulocytes: 0.05 10*3/uL (ref 0.00–0.07)
Basophils Absolute: 0 10*3/uL (ref 0.0–0.1)
Basophils Relative: 0 %
Eosinophils Absolute: 0.2 10*3/uL (ref 0.0–0.5)
Eosinophils Relative: 2 %
HCT: 32.6 % — ABNORMAL LOW (ref 36.0–46.0)
Hemoglobin: 10 g/dL — ABNORMAL LOW (ref 12.0–15.0)
Immature Granulocytes: 1 %
Lymphocytes Relative: 14 %
Lymphs Abs: 1.3 10*3/uL (ref 0.7–4.0)
MCH: 29.2 pg (ref 26.0–34.0)
MCHC: 30.7 g/dL (ref 30.0–36.0)
MCV: 95.3 fL (ref 80.0–100.0)
Monocytes Absolute: 1.1 10*3/uL — ABNORMAL HIGH (ref 0.1–1.0)
Monocytes Relative: 12 %
Neutro Abs: 6.6 10*3/uL (ref 1.7–7.7)
Neutrophils Relative %: 71 %
Platelet Count: 330 10*3/uL (ref 150–400)
RBC: 3.42 MIL/uL — ABNORMAL LOW (ref 3.87–5.11)
RDW: 15.3 % (ref 11.5–15.5)
WBC Count: 9.3 10*3/uL (ref 4.0–10.5)
nRBC: 0 % (ref 0.0–0.2)

## 2019-08-20 LAB — CMP (CANCER CENTER ONLY)
ALT: 11 U/L (ref 0–44)
AST: 21 U/L (ref 15–41)
Albumin: 3.1 g/dL — ABNORMAL LOW (ref 3.5–5.0)
Alkaline Phosphatase: 76 U/L (ref 38–126)
Anion gap: 11 (ref 5–15)
BUN: 14 mg/dL (ref 8–23)
CO2: 24 mmol/L (ref 22–32)
Calcium: 9.3 mg/dL (ref 8.9–10.3)
Chloride: 105 mmol/L (ref 98–111)
Creatinine: 0.84 mg/dL (ref 0.44–1.00)
GFR, Est AFR Am: >60 mL/min (ref >60)
GFR, Estimated: >60 mL/min (ref >60)
Glucose, Bld: 105 mg/dL — ABNORMAL HIGH (ref 70–99)
Potassium: 3.9 mmol/L (ref 3.5–5.1)
Sodium: 140 mmol/L (ref 135–145)
Total Bilirubin: 0.3 mg/dL (ref 0.3–1.2)
Total Protein: 7.8 g/dL (ref 6.5–8.1)

## 2019-08-20 LAB — SAMPLE TO BLOOD BANK

## 2019-08-20 MED ORDER — IOHEXOL 300 MG/ML  SOLN
100.0000 mL | Freq: Once | INTRAMUSCULAR | Status: AC | PRN
  Administered 2019-08-20: 100 mL via INTRAVENOUS

## 2019-08-20 MED ORDER — SODIUM CHLORIDE (PF) 0.9 % IJ SOLN
INTRAMUSCULAR | Status: AC
  Filled 2019-08-20: qty 50

## 2019-08-21 ENCOUNTER — Other Ambulatory Visit: Payer: Self-pay | Admitting: Hematology and Oncology

## 2019-08-21 ENCOUNTER — Inpatient Hospital Stay: Payer: Medicare HMO

## 2019-08-21 ENCOUNTER — Encounter: Payer: Self-pay | Admitting: Hematology and Oncology

## 2019-08-21 ENCOUNTER — Other Ambulatory Visit: Payer: Self-pay

## 2019-08-21 ENCOUNTER — Inpatient Hospital Stay: Payer: Medicare HMO | Admitting: Hematology and Oncology

## 2019-08-21 ENCOUNTER — Telehealth: Payer: Self-pay | Admitting: Hematology and Oncology

## 2019-08-21 DIAGNOSIS — G893 Neoplasm related pain (acute) (chronic): Secondary | ICD-10-CM

## 2019-08-21 DIAGNOSIS — C562 Malignant neoplasm of left ovary: Secondary | ICD-10-CM | POA: Diagnosis not present

## 2019-08-21 DIAGNOSIS — R6881 Early satiety: Secondary | ICD-10-CM | POA: Diagnosis not present

## 2019-08-21 DIAGNOSIS — D61818 Other pancytopenia: Secondary | ICD-10-CM | POA: Diagnosis not present

## 2019-08-21 DIAGNOSIS — I480 Paroxysmal atrial fibrillation: Secondary | ICD-10-CM

## 2019-08-21 DIAGNOSIS — Z7189 Other specified counseling: Secondary | ICD-10-CM

## 2019-08-21 DIAGNOSIS — Z5111 Encounter for antineoplastic chemotherapy: Secondary | ICD-10-CM | POA: Diagnosis not present

## 2019-08-21 DIAGNOSIS — I700 Atherosclerosis of aorta: Secondary | ICD-10-CM | POA: Diagnosis not present

## 2019-08-21 DIAGNOSIS — E440 Moderate protein-calorie malnutrition: Secondary | ICD-10-CM | POA: Diagnosis not present

## 2019-08-21 DIAGNOSIS — R64 Cachexia: Secondary | ICD-10-CM | POA: Diagnosis not present

## 2019-08-21 DIAGNOSIS — Z9889 Other specified postprocedural states: Secondary | ICD-10-CM | POA: Diagnosis not present

## 2019-08-21 DIAGNOSIS — C800 Disseminated malignant neoplasm, unspecified: Secondary | ICD-10-CM

## 2019-08-21 DIAGNOSIS — R971 Elevated cancer antigen 125 [CA 125]: Secondary | ICD-10-CM

## 2019-08-21 DIAGNOSIS — M1990 Unspecified osteoarthritis, unspecified site: Secondary | ICD-10-CM | POA: Diagnosis not present

## 2019-08-21 DIAGNOSIS — C786 Secondary malignant neoplasm of retroperitoneum and peritoneum: Secondary | ICD-10-CM | POA: Diagnosis not present

## 2019-08-21 LAB — CA 125: Cancer Antigen (CA) 125: 52.3 U/mL — ABNORMAL HIGH (ref 0.0–38.1)

## 2019-08-21 MED ORDER — PROCHLORPERAZINE MALEATE 10 MG PO TABS
10.0000 mg | ORAL_TABLET | Freq: Once | ORAL | Status: AC
  Administered 2019-08-21: 10 mg via ORAL

## 2019-08-21 MED ORDER — SODIUM CHLORIDE 0.9 % IV SOLN
800.0000 mg/m2 | Freq: Once | INTRAVENOUS | Status: AC
  Administered 2019-08-21: 1216 mg via INTRAVENOUS
  Filled 2019-08-21: qty 31.98

## 2019-08-21 MED ORDER — PROCHLORPERAZINE MALEATE 10 MG PO TABS
ORAL_TABLET | ORAL | Status: AC
  Filled 2019-08-21: qty 1

## 2019-08-21 MED ORDER — SODIUM CHLORIDE 0.9 % IV SOLN
Freq: Once | INTRAVENOUS | Status: AC
  Filled 2019-08-21: qty 250

## 2019-08-21 NOTE — Assessment & Plan Note (Signed)
We have extensive discussions about goals of care She understood treatment goal is palliative 

## 2019-08-21 NOTE — Assessment & Plan Note (Signed)
She is taking acetaminophen as needed We discussed the risk and benefits of taking narcotic prescription We will reassess in her next visit

## 2019-08-21 NOTE — Telephone Encounter (Signed)
Per 7/14 los, no changes made to pt schedule  

## 2019-08-21 NOTE — Assessment & Plan Note (Signed)
She takes a beta-blocker and calcium channel blocker for this Her blood pressure is noted to be borderline low I recommend she starts checking her blood pressure at home on a regular basis and we will review this next week I also recommend her to consider spacing out her medications

## 2019-08-21 NOTE — Assessment & Plan Note (Addendum)
I have reviewed CT imaging study with the patient and her daughter Unfortunately, she has significant disease burden that recur very quickly soon after surgery She is aware that treatment goal is palliative Per previous discussion, we are in agreement for her to proceed with single agent gemcitabine I will see her next week for toxicity review Given her age and comorbidities, I plan to up front dose adjustment at 800 mg/m2

## 2019-08-21 NOTE — Patient Instructions (Signed)
Gemcitabine injection What is this medicine? GEMCITABINE (jem SYE ta been) is a chemotherapy drug. This medicine is used to treat many types of cancer like breast cancer, lung cancer, pancreatic cancer, and ovarian cancer. This medicine may be used for other purposes; ask your health care provider or pharmacist if you have questions. COMMON BRAND NAME(S): Gemzar, Infugem What should I tell my health care provider before I take this medicine? They need to know if you have any of these conditions:  blood disorders  infection  kidney disease  liver disease  lung or breathing disease, like asthma  recent or ongoing radiation therapy  an unusual or allergic reaction to gemcitabine, other chemotherapy, other medicines, foods, dyes, or preservatives  pregnant or trying to get pregnant  breast-feeding How should I use this medicine? This drug is given as an infusion into a vein. It is administered in a hospital or clinic by a specially trained health care professional. Talk to your pediatrician regarding the use of this medicine in children. Special care may be needed. Overdosage: If you think you have taken too much of this medicine contact a poison control center or emergency room at once. NOTE: This medicine is only for you. Do not share this medicine with others. What if I miss a dose? It is important not to miss your dose. Call your doctor or health care professional if you are unable to keep an appointment. What may interact with this medicine?  medicines to increase blood counts like filgrastim, pegfilgrastim, sargramostim  some other chemotherapy drugs like cisplatin  vaccines Talk to your doctor or health care professional before taking any of these medicines:  acetaminophen  aspirin  ibuprofen  ketoprofen  naproxen This list may not describe all possible interactions. Give your health care provider a list of all the medicines, herbs, non-prescription drugs, or  dietary supplements you use. Also tell them if you smoke, drink alcohol, or use illegal drugs. Some items may interact with your medicine. What should I watch for while using this medicine? Visit your doctor for checks on your progress. This drug may make you feel generally unwell. This is not uncommon, as chemotherapy can affect healthy cells as well as cancer cells. Report any side effects. Continue your course of treatment even though you feel ill unless your doctor tells you to stop. In some cases, you may be given additional medicines to help with side effects. Follow all directions for their use. Call your doctor or health care professional for advice if you get a fever, chills or sore throat, or other symptoms of a cold or flu. Do not treat yourself. This drug decreases your body's ability to fight infections. Try to avoid being around people who are sick. This medicine may increase your risk to bruise or bleed. Call your doctor or health care professional if you notice any unusual bleeding. Be careful brushing and flossing your teeth or using a toothpick because you may get an infection or bleed more easily. If you have any dental work done, tell your dentist you are receiving this medicine. Avoid taking products that contain aspirin, acetaminophen, ibuprofen, naproxen, or ketoprofen unless instructed by your doctor. These medicines may hide a fever. Do not become pregnant while taking this medicine or for 6 months after stopping it. Women should inform their doctor if they wish to become pregnant or think they might be pregnant. Men should not father a child while taking this medicine and for 3 months after stopping it.   There is a potential for serious side effects to an unborn child. Talk to your health care professional or pharmacist for more information. Do not breast-feed an infant while taking this medicine or for at least 1 week after stopping it. Men should inform their doctors if they wish  to father a child. This medicine may lower sperm counts. Talk with your doctor or health care professional if you are concerned about your fertility. What side effects may I notice from receiving this medicine? Side effects that you should report to your doctor or health care professional as soon as possible:  allergic reactions like skin rash, itching or hives, swelling of the face, lips, or tongue  breathing problems  pain, redness, or irritation at site where injected  signs and symptoms of a dangerous change in heartbeat or heart rhythm like chest pain; dizziness; fast or irregular heartbeat; palpitations; feeling faint or lightheaded, falls; breathing problems  signs of decreased platelets or bleeding - bruising, pinpoint red spots on the skin, black, tarry stools, blood in the urine  signs of decreased red blood cells - unusually weak or tired, feeling faint or lightheaded, falls  signs of infection - fever or chills, cough, sore throat, pain or difficulty passing urine  signs and symptoms of kidney injury like trouble passing urine or change in the amount of urine  signs and symptoms of liver injury like dark yellow or brown urine; general ill feeling or flu-like symptoms; light-colored stools; loss of appetite; nausea; right upper belly pain; unusually weak or tired; yellowing of the eyes or skin  swelling of ankles, feet, hands Side effects that usually do not require medical attention (report to your doctor or health care professional if they continue or are bothersome):  constipation  diarrhea  hair loss  loss of appetite  nausea  rash  vomiting This list may not describe all possible side effects. Call your doctor for medical advice about side effects. You may report side effects to FDA at 1-800-FDA-1088. Where should I keep my medicine? This drug is given in a hospital or clinic and will not be stored at home. NOTE: This sheet is a summary. It may not cover all  possible information. If you have questions about this medicine, talk to your doctor, pharmacist, or health care provider.  2020 Elsevier/Gold Standard (2017-04-19 18:06:11)  

## 2019-08-21 NOTE — Assessment & Plan Note (Signed)
Her pancytopenia has improved She does not need transfusion support

## 2019-08-21 NOTE — Assessment & Plan Note (Signed)
She has mild to moderate protein calorie malnutrition We discussed the importance of oral food intake and nutritional supplements

## 2019-08-21 NOTE — Progress Notes (Signed)
[facility] OFFICE PROGRESS NOTE  Patient Care Team: Ernestene Kiel, MD as PCP - General (Internal Medicine) Berniece Salines, DO as PCP - Cardiology (Cardiology) Awanda Mink Craige Cotta, RN as Oncology Nurse Navigator (Oncology) Lafonda Mosses, MD as Consulting Physician (Gynecologic Oncology)  ASSESSMENT & PLAN:  Ovarian cancer, left [facility]) I have reviewed CT imaging study with the patient Whitney her daughter Unfortunately, she has significant disease burden that recur very quickly soon after surgery She is aware that treatment goal is palliative Per previous discussion, we are in agreement for her to proceed with single agent gemcitabine I will see her next week for toxicity review Given her age Whitney comorbidities, I plan to up front dose adjustment at 800 mg/m2  Pancytopenia, acquired [facility]) Her pancytopenia has improved She does Whitney need transfusion support  Atrial fibrillation ([facility]) She takes a beta-blocker Whitney calcium channel blocker for this Her blood pressure is noted to be borderline low I recommend she starts checking her blood pressure at home on a regular basis Whitney we will review this next week I also recommend her to consider spacing out her medications  Cancer associated pain She is taking acetaminophen as needed We discussed the risk Whitney benefits of taking narcotic prescription We will reassess in her next visit  Malignant cachexia ([facility]) She has mild to moderate protein calorie malnutrition We discussed the importance of oral food intake Whitney nutritional supplements  Goals of care, counseling/discussion We have extensive discussions about goals of care She understood treatment goal is palliative   No orders of the defined types were placed in this encounter.   All questions were answered. The patient knows to call the clinic with any problems, questions or concerns. The total time spent in the appointment was 40 minutes encounter with patients including  review of chart Whitney various tests results, discussions about plan of care Whitney coordination of care plan   Heath Lark, MD 08/21/2019 10:43 AM  INTERVAL HISTORY: Please see below for problem oriented charting. 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    Ovarian cancer, left ([facility])  02/07/2019 Imaging   Outside CT chest showed no evidence of metastatic disease   03/20/2019 Imaging   Outside Ct abdomen Whitney pelvis showed diffuse carcinomatosis, left adnexa mass Whitney ascites   04/11/2019 Pathology Results   FINAL MICROSCOPIC DIAGNOSIS:   A. OMENTUM, LEFT ABDOMINAL, NEEDLE CORE BIOPSY:  - Metastatic carcinoma.  See comment    COMMENT:   Immunohistochemical stains show that the tumor cells are positive for PAX8, ER, WT1, CK7 (focal) Whitney CK 5/6 (patchy); Whitney negative for p63, calretinin, D2-40, CK20, GATA3 Whitney CDX2.  This mmunohistochemical profile is consistent with a gynecologic primary Whitney suggestive of a low-grade serous carcinoma.  04/11/2019 Procedure   Image guided core biopsy of the omental disease. In addition, small amount of ascites was collected for cytology   04/18/2019 Cancer Staging   Staging form: Ovary, Fallopian Tube, Whitney Primary Peritoneal Carcinoma,  AJCC 8th Edition - Clinical stage from 04/18/2019: FIGO Stage IIIC (cT3c, cN0, cM0) - Signed by Heath Lark, MD on 04/18/2019   04/18/2019 Tumor Marker   Patient's tumor was tested for the following markers: CA-125 Results of the tumor marker test revealed 402   04/22/2019 - 06/24/2019 Chemotherapy   The patient had carboplatin Whitney taxol x 4 for chemotherapy treatment.     05/13/2019 Tumor Marker   Patient's tumor was tested for the following markers: CA-125 Results of the tumor marker test revealed 146   06/12/2019 Imaging   2.6 cm left adnexal mass in this patient with known ovarian cancer, previously 3.3 cm.   Improving peritoneal disease/omental caking, as above.   No abdominopelvic ascites.   06/24/2019 Tumor Marker   Patient's tumor was tested for the following markers: CA-125 Results of the tumor marker test revealed 16.9   07/23/2019 Pathology Results   A. OMENTUM, EXCISION:  - Metastatic carcinoma to omentum, 29.6 cm, consistent with history of low-grade serous carcinoma   B. SMALL BOWEL, EXCISION:  - Metastatic low-grade serous carcinoma, 4.6 cm, involving small intestinal serosa with associated perforation  - Additional deposits of metastatic carcinoma within the mesentery  - Margins are negative   IHC:  Estrogen Receptor: 70%, MODERATE STAINING INTENSITY  Progesterone Receptor: 10%, STRONG STAINING   07/24/2019 Surgery   Pre-operative Diagnosis: Metastatic carcinoma of GYN origin s/p 4 cycles of NACT   Post-operative Diagnosis: same, suspected ovarian vs primary peritoneal carcinoma   Operation: Diagnostic laparotomy with conversion to exploratory laparotomy, resection of large omental cake, small bowel resection Whitney reanastomosis   Surgeon: Jeral Pinch MD    Operative Findings:  : On EUA, small mobile uterus, some nodularity appreciated in the cul-de-sac.  On intra-abdominal entry, significant disease burden noted including large omental cake adherent to the  anterior abdominal wall, extensive diaphragmatic disease (right greater than left), adhesions between the liver Whitney the diaphragm, disease within the porta hepatis.  After conversion to exploratory laparotomy, additional disease was noted on the stomach Whitney significant disease burden on the sigmoid Whitney rectum, both the colon Whitney mesentery.  All of this disease was diffuse but small volume.  Additionally there were miliary implants along much of the abdominal peritoneum.  Uterus was 4-6 cm Whitney normal in appearance.  Right fallopian tube Whitney ovary atrophic Whitney normal appearing, left ovary mildly enlarged Whitney adherent to the sidewall with what appeared to be some tumor studding.  Small tumor implants were noted over much of the small bowel mesentery with 2 loops of small bowel adherent to the underside of the omental cake.  Appendix with tumor burden Whitney adherent to the right sidewall.  Transverse colon significantly adherent to the omental cake which measured approximately 25 x 10 cm. Suboptimal resection due to burden of disease.  Given inability to resect, resection of tumor burden thought to be causing patient symptoms was performed   08/20/2019 Imaging   1. Interval progression of peritoneal disease. Although there is been Whitney expected reduction in volume of omental cake status post debulking surgery there has been progressive peritoneal disease throughout the remaining portions of the abdomen Whitney pelvis. 2. Increase in size of left adnexal mass. 3. Signs of serosal involvement of the distal small bowel,  cecum Whitney descending colon. There is also been progressive tumor infiltration throughout the sigmoid mesocolon within the pelvis. 4. Aortic atherosclerosis.     08/21/2019 -  Chemotherapy   The patient had gemcitabine (GEMZAR) 1,216 mg in sodium chloride 0.9 % 250 mL chemo infusion, 800 mg/m2 = 1,216 mg (100 % of original dose 800 mg/m2), Intravenous,  Once, 0 of 4 cycles Dose modification: 800 mg/m2  (original dose 800 mg/m2, Cycle 1, Reason: Patient Age)  for chemotherapy treatment.     Genetic Testing   Patient has genetic testing done for MMR. Results revealed patient has the following: MMR - normal     REVIEW OF SYSTEMS:   Constitutional: Denies fevers, chills or abnormal weight loss Eyes: Denies blurriness of vision Ears, nose, mouth, throat, Whitney face: Denies mucositis or sore throat Respiratory: Denies cough, dyspnea or wheezes Cardiovascular: Denies palpitation, chest discomfort or lower extremity swelling Gastrointestinal:  Denies nausea, heartburn or change in bowel habits Skin: Denies abnormal skin rashes Lymphatics: Denies new lymphadenopathy or easy bruising Neurological:Denies numbness, tingling or new weaknesses Behavioral/Psych: Mood is stable, no new changes  All other systems were reviewed with the patient Whitney are negative.  I have reviewed the past medical history, past surgical history, social history Whitney family history with the patient Whitney they are unchanged from previous note.  ALLERGIES:  is allergic to codeine sulfate [codeine], tetanus toxoids, horse-derived products, Whitney sulfa antibiotics.  MEDICATIONS:  Current Outpatient Medications  Medication Sig Dispense Refill  . acetaminophen (TYLENOL) 500 MG tablet Take 500-1,000 mg by mouth every 6 (six) hours as needed (for pain.).    Marland Kitchen dexamethasone (DECADRON) 4 MG tablet Take 2 tabs at the night before Whitney 2 tab the morning of chemotherapy, every 3 weeks, by mouth 24 tablet 6  . diltiazem (CARDIZEM) 120 MG tablet Take 1 tablet (120 mg total) by mouth daily. 90 tablet 3  . docusate sodium (COLACE) 100 MG capsule Take 1 capsule (100 mg total) by mouth 2 (two) times daily. 30 capsule 2  . ELIQUIS 5 MG TABS tablet Take 5 mg by mouth 2 (two) times daily.    . metoprolol succinate (TOPROL XL) 25 MG 24 hr tablet Take 1.5 tablets (37.5 mg total) by mouth daily. 135 tablet 1  . omeprazole (PRILOSEC) 20 MG capsule  Take 20 mg by mouth 2 (two) times daily before a meal.      No current facility-administered medications for this visit.    PHYSICAL EXAMINATION: ECOG PERFORMANCE STATUS: 1 - Symptomatic but completely ambulatory  Vitals:   08/21/19 1015  BP: 128/60  Pulse: 79  Resp: 18  Temp: 98.4 F (36.9 C)  SpO2: 95%   Filed Weights   08/21/19 1015  Weight: 119 lb 3.2 oz (54.1 kg)    GENERAL:alert, no distress Whitney comfortable.  She looks frail Whitney thin SKIN: skin color is pale, texture, turgor are normal, no rashes or significant lesions EYES: normal, Conjunctiva are pink Whitney non-injected, sclera clear OROPHARYNX:no exudate, no erythema Whitney lips, buccal mucosa, Whitney tongue normal  NECK: supple, thyroid normal size, non-tender, without nodularity LYMPH:  no palpable lymphadenopathy in the cervical, axillary or inguinal LUNGS: clear to auscultation Whitney percussion with normal breathing effort HEART: regular rate & rhythm Whitney no murmurs Whitney no lower extremity edema ABDOMEN:abdomen soft, mild tenderness RUQ, no rebound Whitney normal bowel sounds. Well healed surgical scar Musculoskeletal:no cyanosis of digits Whitney no clubbing  NEURO: alert & oriented x 3 with fluent speech, no  focal motor/sensory deficits  LABORATORY DATA:  I have reviewed the data as listed    Component Value Date/Time   NA 140 08/20/2019 0900   NA 137 03/04/2019 1107   K 3.9 08/20/2019 0900   CL 105 08/20/2019 0900   CO2 24 08/20/2019 0900   GLUCOSE 105 (H) 08/20/2019 0900   BUN 14 08/20/2019 0900   BUN 11 03/04/2019 1107   CREATININE 0.84 08/20/2019 0900   CALCIUM 9.3 08/20/2019 0900   PROT 7.8 08/20/2019 0900   PROT 7.4 03/04/2019 1107   ALBUMIN 3.1 (L) 08/20/2019 0900   ALBUMIN 3.6 03/04/2019 1107   AST 21 08/20/2019 0900   ALT 11 08/20/2019 0900   ALKPHOS 76 08/20/2019 0900   BILITOT 0.3 08/20/2019 0900   GFRNONAA >60 08/20/2019 0900   GFRAA >60 08/20/2019 0900    No results found for: SPEP, UPEP  Lab  Results  Component Value Date   WBC 9.3 08/20/2019   NEUTROABS 6.6 08/20/2019   HGB 10.0 (L) 08/20/2019   HCT 32.6 (L) 08/20/2019   MCV 95.3 08/20/2019   PLT 330 08/20/2019      Chemistry      Component Value Date/Time   NA 140 08/20/2019 0900   NA 137 03/04/2019 1107   K 3.9 08/20/2019 0900   CL 105 08/20/2019 0900   CO2 24 08/20/2019 0900   BUN 14 08/20/2019 0900   BUN 11 03/04/2019 1107   CREATININE 0.84 08/20/2019 0900      Component Value Date/Time   CALCIUM 9.3 08/20/2019 0900   ALKPHOS 76 08/20/2019 0900   AST 21 08/20/2019 0900   ALT 11 08/20/2019 0900   BILITOT 0.3 08/20/2019 0900       RADIOGRAPHIC STUDIES:I reviewed imaging studies with patient I have personally reviewed the radiological images as listed Whitney agreed with the findings in the report. CT CHEST W CONTRAST  Result Date: 08/20/2019 CLINICAL DATA:  Follow-up pulmonary metastases. Right lower quadrant abdominal pain. EXAM: CT CHEST, ABDOMEN, Whitney PELVIS WITH CONTRAST TECHNIQUE: Multidetector CT imaging of the chest, abdomen Whitney pelvis was performed following the standard protocol during bolus administration of intravenous contrast. CONTRAST:  143m OMNIPAQUE IOHEXOL 300 MG/ML  SOLN COMPARISON:  06/12/2019 FINDINGS: CT CHEST FINDINGS Cardiovascular: The heart size appears within normal limits. Aortic atherosclerosis. No pericardial effusion. Mediastinum/Nodes: Normal appearance of the thyroid gland. The trachea appears patent Whitney is midline. Normal appearance of the esophagus. No enlarged axillary, supraclavicular, mediastinal, or hilar adenopathy. Lungs/Pleura: No pleural effusion identified. No suspicious pulmonary nodule or mass identified. Musculoskeletal: No chest wall mass or suspicious bone lesions identified. CT ABDOMEN PELVIS FINDINGS Hepatobiliary: No focal liver abnormality identified. Small stones identified within the gallbladder measuring up to 7 mm, image 63/2. No gallbladder wall thickening. No  biliary ductal dilatation. Pancreas: Unremarkable. No pancreatic ductal dilatation or surrounding inflammatory changes. Spleen: Normal in size without focal abnormality. Adrenals/Urinary Tract: Normal appearance of the adrenal glands. The kidneys are unremarkable. No mass or hydronephrosis identified. Urinary bladder unremarkable. Stomach/Bowel: Stomach is nondistended. No bowel wall edema, inflammation or distension. Signs of serosal involvement of the distal small bowel, cecum Whitney descending colon noted. Vascular/Lymphatic: Aortic atherosclerosis. No abdominopelvic adenopathy. Reproductive: Uterus is unremarkable. Left adnexal mass measures 5.6 x 3.7 cm, image 102/2. Previously 2.6 x 2.1 cm. Other: Extensive peritoneal disease is identified, for example: -expected reduction in volume of omental cake status post tumor debulking. -Progressive peritoneal thickening Whitney nodularity overlying the surface of the liver identified, image 51/2. -  Peritoneal mass within the left upper quadrant between the spleen Whitney posterior wall of stomach measures 5.8 x 2.2 cm previously 3.1 x 1.7 cm. -peritoneal nodule along the undersurface of the ventral abdominal wall (eccentric to the right) measures 2.8 x 1.4 cm, image 69/2. Previously 1.0 x 0.6 cm. -progressive tumor infiltration throughout the right pelvic sidewall is noted measuring 1 cm in thickness, image 94/2. Previously this measured 0.4 cm in thickness. -difficult to quantify is progressive tumor infiltration throughout the sigmoid mesocolon Musculoskeletal: No acute or significant osseous findings. IMPRESSION: 1. Interval progression of peritoneal disease. Although there is been Whitney expected reduction in volume of omental cake status post debulking surgery there has been progressive peritoneal disease throughout the remaining portions of the abdomen Whitney pelvis. 2. Increase in size of left adnexal mass. 3. Signs of serosal involvement of the distal small bowel, cecum Whitney  descending colon. There is also been progressive tumor infiltration throughout the sigmoid mesocolon within the pelvis. 4. Aortic atherosclerosis. Aortic Atherosclerosis (ICD10-I70.0). Electronically Signed   By: Kerby Moors M.D.   On: 08/20/2019 12:59   CT ABDOMEN PELVIS W CONTRAST  Result Date: 08/20/2019 CLINICAL DATA:  Follow-up pulmonary metastases. Right lower quadrant abdominal pain. EXAM: CT CHEST, ABDOMEN, Whitney PELVIS WITH CONTRAST TECHNIQUE: Multidetector CT imaging of the chest, abdomen Whitney pelvis was performed following the standard protocol during bolus administration of intravenous contrast. CONTRAST:  194m OMNIPAQUE IOHEXOL 300 MG/ML  SOLN COMPARISON:  06/12/2019 FINDINGS: CT CHEST FINDINGS Cardiovascular: The heart size appears within normal limits. Aortic atherosclerosis. No pericardial effusion. Mediastinum/Nodes: Normal appearance of the thyroid gland. The trachea appears patent Whitney is midline. Normal appearance of the esophagus. No enlarged axillary, supraclavicular, mediastinal, or hilar adenopathy. Lungs/Pleura: No pleural effusion identified. No suspicious pulmonary nodule or mass identified. Musculoskeletal: No chest wall mass or suspicious bone lesions identified. CT ABDOMEN PELVIS FINDINGS Hepatobiliary: No focal liver abnormality identified. Small stones identified within the gallbladder measuring up to 7 mm, image 63/2. No gallbladder wall thickening. No biliary ductal dilatation. Pancreas: Unremarkable. No pancreatic ductal dilatation or surrounding inflammatory changes. Spleen: Normal in size without focal abnormality. Adrenals/Urinary Tract: Normal appearance of the adrenal glands. The kidneys are unremarkable. No mass or hydronephrosis identified. Urinary bladder unremarkable. Stomach/Bowel: Stomach is nondistended. No bowel wall edema, inflammation or distension. Signs of serosal involvement of the distal small bowel, cecum Whitney descending colon noted. Vascular/Lymphatic:  Aortic atherosclerosis. No abdominopelvic adenopathy. Reproductive: Uterus is unremarkable. Left adnexal mass measures 5.6 x 3.7 cm, image 102/2. Previously 2.6 x 2.1 cm. Other: Extensive peritoneal disease is identified, for example: -expected reduction in volume of omental cake status post tumor debulking. -Progressive peritoneal thickening Whitney nodularity overlying the surface of the liver identified, image 51/2. -Peritoneal mass within the left upper quadrant between the spleen Whitney posterior wall of stomach measures 5.8 x 2.2 cm previously 3.1 x 1.7 cm. -peritoneal nodule along the undersurface of the ventral abdominal wall (eccentric to the right) measures 2.8 x 1.4 cm, image 69/2. Previously 1.0 x 0.6 cm. -progressive tumor infiltration throughout the right pelvic sidewall is noted measuring 1 cm in thickness, image 94/2. Previously this measured 0.4 cm in thickness. -difficult to quantify is progressive tumor infiltration throughout the sigmoid mesocolon Musculoskeletal: No acute or significant osseous findings. IMPRESSION: 1. Interval progression of peritoneal disease. Although there is been Whitney expected reduction in volume of omental cake status post debulking surgery there has been progressive peritoneal disease throughout the remaining portions of the abdomen Whitney  pelvis. 2. Increase in size of left adnexal mass. 3. Signs of serosal involvement of the distal small bowel, cecum Whitney descending colon. There is also been progressive tumor infiltration throughout the sigmoid mesocolon within the pelvis. 4. Aortic atherosclerosis. Aortic Atherosclerosis (ICD10-I70.0). Electronically Signed   By: Kerby Moors M.D.   On: 08/20/2019 12:59

## 2019-08-22 DIAGNOSIS — I4891 Unspecified atrial fibrillation: Secondary | ICD-10-CM | POA: Diagnosis not present

## 2019-08-22 DIAGNOSIS — C562 Malignant neoplasm of left ovary: Secondary | ICD-10-CM | POA: Diagnosis not present

## 2019-08-22 DIAGNOSIS — Z6822 Body mass index (BMI) 22.0-22.9, adult: Secondary | ICD-10-CM | POA: Diagnosis not present

## 2019-08-28 ENCOUNTER — Other Ambulatory Visit: Payer: Medicare HMO

## 2019-08-28 ENCOUNTER — Inpatient Hospital Stay: Payer: Medicare HMO | Admitting: Hematology and Oncology

## 2019-08-28 ENCOUNTER — Inpatient Hospital Stay: Payer: Medicare HMO

## 2019-08-28 ENCOUNTER — Other Ambulatory Visit: Payer: Self-pay

## 2019-08-28 ENCOUNTER — Encounter: Payer: Self-pay | Admitting: Hematology and Oncology

## 2019-08-28 DIAGNOSIS — R6881 Early satiety: Secondary | ICD-10-CM | POA: Diagnosis not present

## 2019-08-28 DIAGNOSIS — C562 Malignant neoplasm of left ovary: Secondary | ICD-10-CM

## 2019-08-28 DIAGNOSIS — Z9889 Other specified postprocedural states: Secondary | ICD-10-CM | POA: Diagnosis not present

## 2019-08-28 DIAGNOSIS — C800 Disseminated malignant neoplasm, unspecified: Secondary | ICD-10-CM

## 2019-08-28 DIAGNOSIS — Z7189 Other specified counseling: Secondary | ICD-10-CM

## 2019-08-28 DIAGNOSIS — R971 Elevated cancer antigen 125 [CA 125]: Secondary | ICD-10-CM

## 2019-08-28 DIAGNOSIS — G893 Neoplasm related pain (acute) (chronic): Secondary | ICD-10-CM | POA: Diagnosis not present

## 2019-08-28 DIAGNOSIS — D61818 Other pancytopenia: Secondary | ICD-10-CM

## 2019-08-28 DIAGNOSIS — C786 Secondary malignant neoplasm of retroperitoneum and peritoneum: Secondary | ICD-10-CM | POA: Diagnosis not present

## 2019-08-28 DIAGNOSIS — R64 Cachexia: Secondary | ICD-10-CM | POA: Diagnosis not present

## 2019-08-28 DIAGNOSIS — I480 Paroxysmal atrial fibrillation: Secondary | ICD-10-CM | POA: Diagnosis not present

## 2019-08-28 DIAGNOSIS — Z5111 Encounter for antineoplastic chemotherapy: Secondary | ICD-10-CM | POA: Diagnosis not present

## 2019-08-28 DIAGNOSIS — M1990 Unspecified osteoarthritis, unspecified site: Secondary | ICD-10-CM | POA: Diagnosis not present

## 2019-08-28 DIAGNOSIS — I700 Atherosclerosis of aorta: Secondary | ICD-10-CM | POA: Diagnosis not present

## 2019-08-28 DIAGNOSIS — E440 Moderate protein-calorie malnutrition: Secondary | ICD-10-CM | POA: Diagnosis not present

## 2019-08-28 LAB — CMP (CANCER CENTER ONLY)
ALT: 15 U/L (ref 0–44)
AST: 23 U/L (ref 15–41)
Albumin: 3.2 g/dL — ABNORMAL LOW (ref 3.5–5.0)
Alkaline Phosphatase: 77 U/L (ref 38–126)
Anion gap: 12 (ref 5–15)
BUN: 20 mg/dL (ref 8–23)
CO2: 20 mmol/L — ABNORMAL LOW (ref 22–32)
Calcium: 9.9 mg/dL (ref 8.9–10.3)
Chloride: 107 mmol/L (ref 98–111)
Creatinine: 0.91 mg/dL (ref 0.44–1.00)
GFR, Est AFR Am: >60 mL/min (ref >60)
GFR, Estimated: 58 mL/min — ABNORMAL LOW (ref >60)
Glucose, Bld: 132 mg/dL — ABNORMAL HIGH (ref 70–99)
Potassium: 3.8 mmol/L (ref 3.5–5.1)
Sodium: 139 mmol/L (ref 135–145)
Total Bilirubin: <0.2 mg/dL — ABNORMAL LOW (ref 0.3–1.2)
Total Protein: 8.4 g/dL — ABNORMAL HIGH (ref 6.5–8.1)

## 2019-08-28 LAB — CBC WITH DIFFERENTIAL (CANCER CENTER ONLY)
Abs Immature Granulocytes: 0.03 10*3/uL (ref 0.00–0.07)
Basophils Absolute: 0 10*3/uL (ref 0.0–0.1)
Basophils Relative: 1 %
Eosinophils Absolute: 0.1 10*3/uL (ref 0.0–0.5)
Eosinophils Relative: 1 %
HCT: 31.9 % — ABNORMAL LOW (ref 36.0–46.0)
Hemoglobin: 10.2 g/dL — ABNORMAL LOW (ref 12.0–15.0)
Immature Granulocytes: 1 %
Lymphocytes Relative: 29 %
Lymphs Abs: 1.3 10*3/uL (ref 0.7–4.0)
MCH: 30.6 pg (ref 26.0–34.0)
MCHC: 32 g/dL (ref 30.0–36.0)
MCV: 95.8 fL (ref 80.0–100.0)
Monocytes Absolute: 0.5 10*3/uL (ref 0.1–1.0)
Monocytes Relative: 11 %
Neutro Abs: 2.6 10*3/uL (ref 1.7–7.7)
Neutrophils Relative %: 57 %
Platelet Count: 227 10*3/uL (ref 150–400)
RBC: 3.33 MIL/uL — ABNORMAL LOW (ref 3.87–5.11)
RDW: 14.6 % (ref 11.5–15.5)
WBC Count: 4.5 10*3/uL (ref 4.0–10.5)
nRBC: 0 % (ref 0.0–0.2)

## 2019-08-28 LAB — SAMPLE TO BLOOD BANK

## 2019-08-28 MED ORDER — PROCHLORPERAZINE MALEATE 10 MG PO TABS
10.0000 mg | ORAL_TABLET | Freq: Once | ORAL | Status: AC
  Administered 2019-08-28: 10 mg via ORAL

## 2019-08-28 MED ORDER — SODIUM CHLORIDE 0.9 % IV SOLN
Freq: Once | INTRAVENOUS | Status: AC
  Filled 2019-08-28: qty 250

## 2019-08-28 MED ORDER — PROCHLORPERAZINE MALEATE 10 MG PO TABS
ORAL_TABLET | ORAL | Status: AC
  Filled 2019-08-28: qty 1

## 2019-08-28 MED ORDER — SODIUM CHLORIDE 0.9 % IV SOLN
800.0000 mg/m2 | Freq: Once | INTRAVENOUS | Status: AC
  Administered 2019-08-28: 1216 mg via INTRAVENOUS
  Filled 2019-08-28: qty 31.98

## 2019-08-28 NOTE — Assessment & Plan Note (Signed)
So far, she tolerated treatment very well Her abdominal pain has resolved Her energy level is stable We will proceed with treatment as scheduled Next month, if her blood counts continue to improve, we might consider modifying her chemotherapy schedule She is in agreement

## 2019-08-28 NOTE — Progress Notes (Signed)
[place] OFFICE PROGRESS NOTE  Patient Care Team: Ernestene Kiel, MD as PCP - General (Internal Medicine) Berniece Salines, DO as PCP - Cardiology (Cardiology) Awanda Mink Craige Cotta, RN as Oncology Nurse Navigator (Oncology) Lafonda Mosses, MD as Consulting Physician (Gynecologic Oncology)  ASSESSMENT & PLAN:  Ovarian cancer, left [facility]) So far, she tolerated treatment very well Her abdominal pain has resolved Her energy level is stable We will proceed with treatment as scheduled Next month, if her blood counts continue to improve, we might consider modifying her chemotherapy schedule She is in agreement  Malignant cachexia ([place]) Her oral intake has improved She has gained some weight Her serum albumin has improved We discussed the importance of frequent small meals  Pancytopenia, acquired ([place]) She tolerated treatment well Her anemia is stable We will monitor carefully and proceed with chemo without delay She does not need transfusion support   No orders of the defined types were placed in this encounter.   All questions were answered. The patient knows to call the clinic with any problems, questions or concerns. The total time spent in the appointment was 20 minutes encounter with patients including review of chart and various tests results, discussions about plan of care and coordination of care plan   Heath Lark, MD 08/28/2019 12:04 PM  INTERVAL HISTORY: Please see below for problem oriented charting. 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    Ovarian cancer, left ([place])  02/07/2019 Imaging   Outside CT chest showed no evidence of metastatic disease   03/20/2019 Imaging   Outside Ct abdomen and pelvis showed diffuse carcinomatosis, left adnexa mass and ascites   04/11/2019 Pathology Results   FINAL MICROSCOPIC DIAGNOSIS:   A. OMENTUM, LEFT ABDOMINAL, NEEDLE CORE BIOPSY:  - Metastatic carcinoma.  See comment    COMMENT:   Immunohistochemical stains show that the tumor cells are positive for PAX8, ER, WT1, CK7 (focal) and CK 5/6 (patchy); and negative for p63, calretinin, D2-40, CK20, GATA3 and CDX2.  This mmunohistochemical profile is consistent with a gynecologic primary and suggestive of a low-grade serous carcinoma.   04/11/2019 Procedure   Image guided core biopsy of the omental disease. In addition, small amount of ascites was collected for cytology   04/18/2019 Cancer Staging   Staging form: Ovary, Fallopian Tube, and Primary Peritoneal Carcinoma, AJCC 8th Edition - Clinical stage from 04/18/2019: FIGO Stage IIIC (cT3c, cN0, cM0) - Signed by Heath Lark, MD on 04/18/2019   04/18/2019 Tumor Marker   Patient's tumor was tested for the following markers: CA-125 Results of the tumor marker test revealed 402   04/22/2019 - 06/24/2019 Chemotherapy   The patient had carboplatin and taxol x  4 for chemotherapy treatment.     05/13/2019 Tumor Marker   Patient's tumor was tested for the following markers: CA-125 Results of the tumor marker test revealed 146   06/12/2019 Imaging   2.6 cm left adnexal mass in this patient with  known ovarian cancer, previously 3.3 cm.   Improving peritoneal disease/omental caking, as above.   No abdominopelvic ascites.   06/24/2019 Tumor Marker   Patient's tumor was tested for the following markers: CA-125 Results of the tumor marker test revealed 16.9   07/23/2019 Pathology Results   A. OMENTUM, EXCISION:  - Metastatic carcinoma to omentum, 29.6 cm, consistent with history of low-grade serous carcinoma   B. SMALL BOWEL, EXCISION:  - Metastatic low-grade serous carcinoma, 4.6 cm, involving small intestinal serosa with associated perforation  - Additional deposits of metastatic carcinoma within the mesentery  - Margins are negative   IHC:  Estrogen Receptor: 70%, MODERATE STAINING INTENSITY  Progesterone Receptor: 10%, STRONG STAINING   07/24/2019 Surgery   Pre-operative Diagnosis: Metastatic carcinoma of GYN origin s/p 4 cycles of NACT   Post-operative Diagnosis: same, suspected ovarian vs primary peritoneal carcinoma   Operation: Diagnostic laparotomy with conversion to exploratory laparotomy, resection of large omental cake, small bowel resection and reanastomosis   Surgeon: Eugene Garnet MD    Operative Findings:  : On EUA, small mobile uterus, some nodularity appreciated in the cul-de-sac.  On intra-abdominal entry, significant disease burden noted including large omental cake adherent to the anterior abdominal wall, extensive diaphragmatic disease (right greater than left), adhesions between the liver and the diaphragm, disease within the porta hepatis.  After conversion to exploratory laparotomy, additional disease was noted on the stomach and significant disease burden on the sigmoid and rectum, both the colon and mesentery.  All of this disease was diffuse but small volume.  Additionally there were miliary implants along much of the abdominal peritoneum.  Uterus was 4-6 cm and normal in appearance.  Right fallopian tube and ovary atrophic and normal appearing, left  ovary mildly enlarged and adherent to the sidewall with what appeared to be some tumor studding.  Small tumor implants were noted over much of the small bowel mesentery with 2 loops of small bowel adherent to the underside of the omental cake.  Appendix with tumor burden and adherent to the right sidewall.  Transverse colon significantly adherent to the omental cake which measured approximately 25 x 10 cm. Suboptimal resection due to burden of disease.  Given inability to resect, resection of tumor burden thought to be causing patient symptoms was performed   08/20/2019 Imaging   1. Interval progression of peritoneal disease. Although there is been and expected reduction in volume of omental cake status post debulking surgery there has been progressive peritoneal disease throughout the remaining portions of the abdomen and pelvis. 2. Increase in size of left adnexal mass. 3. Signs of serosal involvement of the distal small bowel, cecum and descending colon. There is also been progressive tumor infiltration throughout the sigmoid mesocolon within the pelvis. 4. Aortic atherosclerosis.     08/20/2019 Tumor Marker   Patient's tumor was tested for the following markers: CA-125 Results of the tumor marker test revealed 52.3   08/21/2019 -  Chemotherapy   The patient had gemcitabine (GEMZAR) 1,216 mg in sodium chloride 0.9 % 250 mL chemo infusion, 800 mg/m2 = 1,216 mg (100 % of original dose 800 mg/m2), Intravenous,  Once, 1 of 4 cycles Dose modification: 800 mg/m2 (original dose 800 mg/m2, Cycle  1, Reason: Patient Age) Administration: 1,216 mg (08/21/2019), 1,216 mg (08/28/2019)  for chemotherapy treatment.     Genetic Testing   Patient has genetic testing done for MMR. Results revealed patient has the following: MMR - normal     REVIEW OF SYSTEMS:   Constitutional: Denies fevers, chills or abnormal weight loss Eyes: Denies blurriness of vision Ears, nose, mouth, throat, and face: Denies mucositis  or sore throat Respiratory: Denies cough, dyspnea or wheezes Cardiovascular: Denies palpitation, chest discomfort or lower extremity swelling Gastrointestinal:  Denies nausea, heartburn or change in bowel habits Skin: Denies abnormal skin rashes Lymphatics: Denies new lymphadenopathy or easy bruising Neurological:Denies numbness, tingling or new weaknesses Behavioral/Psych: Mood is stable, no new changes  All other systems were reviewed with the patient and are negative.  I have reviewed the past medical history, past surgical history, social history and family history with the patient and they are unchanged from previous note.  ALLERGIES:  is allergic to codeine sulfate [codeine], tetanus toxoids, horse-derived products, and sulfa antibiotics.  MEDICATIONS:  Current Outpatient Medications  Medication Sig Dispense Refill  . acetaminophen (TYLENOL) 500 MG tablet Take 500-1,000 mg by mouth every 6 (six) hours as needed (for pain.).    Marland Kitchen docusate sodium (COLACE) 100 MG capsule Take 1 capsule (100 mg total) by mouth 2 (two) times daily. 30 capsule 2  . ELIQUIS 5 MG TABS tablet Take 5 mg by mouth 2 (two) times daily.    . metoprolol succinate (TOPROL XL) 25 MG 24 hr tablet Take 1.5 tablets (37.5 mg total) by mouth daily. 135 tablet 1  . omeprazole (PRILOSEC) 20 MG capsule Take 20 mg by mouth 2 (two) times daily before a meal.     . ondansetron (ZOFRAN) 4 MG tablet Take 4 mg by mouth every 8 (eight) hours as needed for nausea or vomiting. PRN for nausea    . prochlorperazine (COMPAZINE) 10 MG tablet Take 10 mg by mouth every 6 (six) hours as needed for nausea or vomiting. PRN for nausea     No current facility-administered medications for this visit.    PHYSICAL EXAMINATION: ECOG PERFORMANCE STATUS: 1 - Symptomatic but completely ambulatory  Vitals:   08/28/19 0950  BP: 140/67  Pulse: 86  Resp: 18  Temp: 98.1 F (36.7 C)  SpO2: 100%   Filed Weights   08/28/19 0950  Weight: 120 lb  (54.4 kg)    GENERAL:alert, no distress and comfortable SKIN: skin color, texture, turgor are normal, no rashes or significant lesions EYES: normal, Conjunctiva are pink and non-injected, sclera clear OROPHARYNX:no exudate, no erythema and lips, buccal mucosa, and tongue normal  NECK: supple, thyroid normal size, non-tender, without nodularity LYMPH:  no palpable lymphadenopathy in the cervical, axillary or inguinal LUNGS: clear to auscultation and percussion with normal breathing effort HEART: regular rate & rhythm and no murmurs and no lower extremity edema ABDOMEN:abdomen soft, non-tender and normal bowel sounds Musculoskeletal:no cyanosis of digits and no clubbing  NEURO: alert & oriented x 3 with fluent speech, no focal motor/sensory deficits  LABORATORY DATA:  I have reviewed the data as listed    Component Value Date/Time   NA 139 08/28/2019 0859   NA 137 03/04/2019 1107   K 3.8 08/28/2019 0859   CL 107 08/28/2019 0859   CO2 20 (L) 08/28/2019 0859   GLUCOSE 132 (H) 08/28/2019 0859   BUN 20 08/28/2019 0859   BUN 11 03/04/2019 1107   CREATININE 0.91 08/28/2019 0859   CALCIUM 9.9  08/28/2019 0859   PROT 8.4 (H) 08/28/2019 0859   PROT 7.4 03/04/2019 1107   ALBUMIN 3.2 (L) 08/28/2019 0859   ALBUMIN 3.6 03/04/2019 1107   AST 23 08/28/2019 0859   ALT 15 08/28/2019 0859   ALKPHOS 77 08/28/2019 0859   BILITOT <0.2 (L) 08/28/2019 0859   GFRNONAA 58 (L) 08/28/2019 0859   GFRAA >60 08/28/2019 0859    No results found for: SPEP, UPEP  Lab Results  Component Value Date   WBC 4.5 08/28/2019   NEUTROABS 2.6 08/28/2019   HGB 10.2 (L) 08/28/2019   HCT 31.9 (L) 08/28/2019   MCV 95.8 08/28/2019   PLT 227 08/28/2019      Chemistry      Component Value Date/Time   NA 139 08/28/2019 0859   NA 137 03/04/2019 1107   K 3.8 08/28/2019 0859   CL 107 08/28/2019 0859   CO2 20 (L) 08/28/2019 0859   BUN 20 08/28/2019 0859   BUN 11 03/04/2019 1107   CREATININE 0.91 08/28/2019 0859       Component Value Date/Time   CALCIUM 9.9 08/28/2019 0859   ALKPHOS 77 08/28/2019 0859   AST 23 08/28/2019 0859   ALT 15 08/28/2019 0859   BILITOT <0.2 (L) 08/28/2019 7414

## 2019-08-28 NOTE — Patient Instructions (Signed)
[facility] °Discharge Instructions for Patients Receiving Chemotherapy ° °Today you received the following chemotherapy agents Gemzar ° °To help prevent nausea and vomiting after your treatment, we encourage you to take your nausea medication as directed. °  °If you develop nausea and vomiting that is not controlled by your nausea medication, call the clinic.  ° °BELOW ARE SYMPTOMS THAT SHOULD BE REPORTED IMMEDIATELY: °· *FEVER GREATER THAN 100.5 F °· *CHILLS WITH OR WITHOUT FEVER °· NAUSEA AND VOMITING THAT IS NOT CONTROLLED WITH YOUR NAUSEA MEDICATION °· *UNUSUAL SHORTNESS OF BREATH °· *UNUSUAL BRUISING OR BLEEDING °· TENDERNESS IN MOUTH AND THROAT WITH OR WITHOUT PRESENCE OF ULCERS °· *URINARY PROBLEMS °· *BOWEL PROBLEMS °· UNUSUAL RASH °Items with * indicate a potential emergency and should be followed up as soon as possible. ° °Feel free to call the clinic should you have any questions or concerns. The clinic phone number is [phone]. ° °Please show the CHEMO ALERT CARD at check-in to the Emergency Department and triage nurse. ° ° °

## 2019-08-28 NOTE — Assessment & Plan Note (Signed)
She tolerated treatment well Her anemia is stable We will monitor carefully and proceed with chemo without delay She does not need transfusion support 

## 2019-08-28 NOTE — Assessment & Plan Note (Signed)
Her oral intake has improved She has gained some weight Her serum albumin has improved We discussed the importance of frequent small meals

## 2019-09-11 ENCOUNTER — Inpatient Hospital Stay: Payer: Medicare HMO | Attending: Gynecologic Oncology

## 2019-09-11 ENCOUNTER — Other Ambulatory Visit: Payer: Self-pay

## 2019-09-11 ENCOUNTER — Inpatient Hospital Stay: Payer: Medicare HMO

## 2019-09-11 VITALS — BP 131/60 | HR 70 | Temp 97.7°F | Resp 17

## 2019-09-11 DIAGNOSIS — C562 Malignant neoplasm of left ovary: Secondary | ICD-10-CM | POA: Diagnosis present

## 2019-09-11 DIAGNOSIS — I4891 Unspecified atrial fibrillation: Secondary | ICD-10-CM | POA: Diagnosis not present

## 2019-09-11 DIAGNOSIS — C786 Secondary malignant neoplasm of retroperitoneum and peritoneum: Secondary | ICD-10-CM | POA: Insufficient documentation

## 2019-09-11 DIAGNOSIS — Z7901 Long term (current) use of anticoagulants: Secondary | ICD-10-CM | POA: Diagnosis not present

## 2019-09-11 DIAGNOSIS — C800 Disseminated malignant neoplasm, unspecified: Secondary | ICD-10-CM

## 2019-09-11 DIAGNOSIS — Z5111 Encounter for antineoplastic chemotherapy: Secondary | ICD-10-CM | POA: Insufficient documentation

## 2019-09-11 DIAGNOSIS — R971 Elevated cancer antigen 125 [CA 125]: Secondary | ICD-10-CM

## 2019-09-11 DIAGNOSIS — Z79899 Other long term (current) drug therapy: Secondary | ICD-10-CM | POA: Diagnosis not present

## 2019-09-11 DIAGNOSIS — G893 Neoplasm related pain (acute) (chronic): Secondary | ICD-10-CM | POA: Diagnosis not present

## 2019-09-11 DIAGNOSIS — D61818 Other pancytopenia: Secondary | ICD-10-CM | POA: Diagnosis not present

## 2019-09-11 DIAGNOSIS — R188 Other ascites: Secondary | ICD-10-CM | POA: Insufficient documentation

## 2019-09-11 DIAGNOSIS — Z7189 Other specified counseling: Secondary | ICD-10-CM

## 2019-09-11 LAB — CBC WITH DIFFERENTIAL (CANCER CENTER ONLY)
Abs Immature Granulocytes: 0.02 10*3/uL (ref 0.00–0.07)
Basophils Absolute: 0 10*3/uL (ref 0.0–0.1)
Basophils Relative: 1 %
Eosinophils Absolute: 0.1 10*3/uL (ref 0.0–0.5)
Eosinophils Relative: 1 %
HCT: 33.4 % — ABNORMAL LOW (ref 36.0–46.0)
Hemoglobin: 10.6 g/dL — ABNORMAL LOW (ref 12.0–15.0)
Immature Granulocytes: 0 %
Lymphocytes Relative: 21 %
Lymphs Abs: 1.5 10*3/uL (ref 0.7–4.0)
MCH: 30.5 pg (ref 26.0–34.0)
MCHC: 31.7 g/dL (ref 30.0–36.0)
MCV: 96 fL (ref 80.0–100.0)
Monocytes Absolute: 0.6 10*3/uL (ref 0.1–1.0)
Monocytes Relative: 9 %
Neutro Abs: 4.8 10*3/uL (ref 1.7–7.7)
Neutrophils Relative %: 68 %
Platelet Count: 442 10*3/uL — ABNORMAL HIGH (ref 150–400)
RBC: 3.48 MIL/uL — ABNORMAL LOW (ref 3.87–5.11)
RDW: 15.9 % — ABNORMAL HIGH (ref 11.5–15.5)
WBC Count: 7 10*3/uL (ref 4.0–10.5)
nRBC: 0 % (ref 0.0–0.2)

## 2019-09-11 LAB — CMP (CANCER CENTER ONLY)
ALT: 14 U/L (ref 0–44)
AST: 24 U/L (ref 15–41)
Albumin: 3.4 g/dL — ABNORMAL LOW (ref 3.5–5.0)
Alkaline Phosphatase: 67 U/L (ref 38–126)
Anion gap: 11 (ref 5–15)
BUN: 25 mg/dL — ABNORMAL HIGH (ref 8–23)
CO2: 20 mmol/L — ABNORMAL LOW (ref 22–32)
Calcium: 10.1 mg/dL (ref 8.9–10.3)
Chloride: 106 mmol/L (ref 98–111)
Creatinine: 0.83 mg/dL (ref 0.44–1.00)
GFR, Est AFR Am: >60 mL/min (ref >60)
GFR, Estimated: >60 mL/min (ref >60)
Glucose, Bld: 84 mg/dL (ref 70–99)
Potassium: 4.3 mmol/L (ref 3.5–5.1)
Sodium: 137 mmol/L (ref 135–145)
Total Bilirubin: 0.2 mg/dL — ABNORMAL LOW (ref 0.3–1.2)
Total Protein: 8.3 g/dL — ABNORMAL HIGH (ref 6.5–8.1)

## 2019-09-11 LAB — SAMPLE TO BLOOD BANK

## 2019-09-11 MED ORDER — SODIUM CHLORIDE 0.9 % IV SOLN
800.0000 mg/m2 | Freq: Once | INTRAVENOUS | Status: AC
  Administered 2019-09-11: 1216 mg via INTRAVENOUS
  Filled 2019-09-11: qty 31.98

## 2019-09-11 MED ORDER — SODIUM CHLORIDE 0.9 % IV SOLN
Freq: Once | INTRAVENOUS | Status: AC
  Filled 2019-09-11: qty 250

## 2019-09-11 MED ORDER — PROCHLORPERAZINE MALEATE 10 MG PO TABS
ORAL_TABLET | ORAL | Status: AC
  Filled 2019-09-11: qty 1

## 2019-09-11 MED ORDER — PROCHLORPERAZINE MALEATE 10 MG PO TABS
10.0000 mg | ORAL_TABLET | Freq: Once | ORAL | Status: AC
  Administered 2019-09-11: 10 mg via ORAL

## 2019-09-11 NOTE — Patient Instructions (Signed)
Gemcitabine injection What is this medicine? GEMCITABINE (jem SYE ta been) is a chemotherapy drug. This medicine is used to treat many types of cancer like breast cancer, lung cancer, pancreatic cancer, and ovarian cancer. This medicine may be used for other purposes; ask your health care provider or pharmacist if you have questions. COMMON BRAND NAME(S): Gemzar, Infugem What should I tell my health care provider before I take this medicine? They need to know if you have any of these conditions:  blood disorders  infection  kidney disease  liver disease  lung or breathing disease, like asthma  recent or ongoing radiation therapy  an unusual or allergic reaction to gemcitabine, other chemotherapy, other medicines, foods, dyes, or preservatives  pregnant or trying to get pregnant  breast-feeding How should I use this medicine? This drug is given as an infusion into a vein. It is administered in a hospital or clinic by a specially trained health care professional. Talk to your pediatrician regarding the use of this medicine in children. Special care may be needed. Overdosage: If you think you have taken too much of this medicine contact a poison control center or emergency room at once. NOTE: This medicine is only for you. Do not share this medicine with others. What if I miss a dose? It is important not to miss your dose. Call your doctor or health care professional if you are unable to keep an appointment. What may interact with this medicine?  medicines to increase blood counts like filgrastim, pegfilgrastim, sargramostim  some other chemotherapy drugs like cisplatin  vaccines Talk to your doctor or health care professional before taking any of these medicines:  acetaminophen  aspirin  ibuprofen  ketoprofen  naproxen This list may not describe all possible interactions. Give your health care provider a list of all the medicines, herbs, non-prescription drugs, or  dietary supplements you use. Also tell them if you smoke, drink alcohol, or use illegal drugs. Some items may interact with your medicine. What should I watch for while using this medicine? Visit your doctor for checks on your progress. This drug may make you feel generally unwell. This is not uncommon, as chemotherapy can affect healthy cells as well as cancer cells. Report any side effects. Continue your course of treatment even though you feel ill unless your doctor tells you to stop. In some cases, you may be given additional medicines to help with side effects. Follow all directions for their use. Call your doctor or health care professional for advice if you get a fever, chills or sore throat, or other symptoms of a cold or flu. Do not treat yourself. This drug decreases your body's ability to fight infections. Try to avoid being around people who are sick. This medicine may increase your risk to bruise or bleed. Call your doctor or health care professional if you notice any unusual bleeding. Be careful brushing and flossing your teeth or using a toothpick because you may get an infection or bleed more easily. If you have any dental work done, tell your dentist you are receiving this medicine. Avoid taking products that contain aspirin, acetaminophen, ibuprofen, naproxen, or ketoprofen unless instructed by your doctor. These medicines may hide a fever. Do not become pregnant while taking this medicine or for 6 months after stopping it. Women should inform their doctor if they wish to become pregnant or think they might be pregnant. Men should not father a child while taking this medicine and for 3 months after stopping it.   There is a potential for serious side effects to an unborn child. Talk to your health care professional or pharmacist for more information. Do not breast-feed an infant while taking this medicine or for at least 1 week after stopping it. Men should inform their doctors if they wish  to father a child. This medicine may lower sperm counts. Talk with your doctor or health care professional if you are concerned about your fertility. What side effects may I notice from receiving this medicine? Side effects that you should report to your doctor or health care professional as soon as possible:  allergic reactions like skin rash, itching or hives, swelling of the face, lips, or tongue  breathing problems  pain, redness, or irritation at site where injected  signs and symptoms of a dangerous change in heartbeat or heart rhythm like chest pain; dizziness; fast or irregular heartbeat; palpitations; feeling faint or lightheaded, falls; breathing problems  signs of decreased platelets or bleeding - bruising, pinpoint red spots on the skin, black, tarry stools, blood in the urine  signs of decreased red blood cells - unusually weak or tired, feeling faint or lightheaded, falls  signs of infection - fever or chills, cough, sore throat, pain or difficulty passing urine  signs and symptoms of kidney injury like trouble passing urine or change in the amount of urine  signs and symptoms of liver injury like dark yellow or brown urine; general ill feeling or flu-like symptoms; light-colored stools; loss of appetite; nausea; right upper belly pain; unusually weak or tired; yellowing of the eyes or skin  swelling of ankles, feet, hands Side effects that usually do not require medical attention (report to your doctor or health care professional if they continue or are bothersome):  constipation  diarrhea  hair loss  loss of appetite  nausea  rash  vomiting This list may not describe all possible side effects. Call your doctor for medical advice about side effects. You may report side effects to FDA at 1-800-FDA-1088. Where should I keep my medicine? This drug is given in a hospital or clinic and will not be stored at home. NOTE: This sheet is a summary. It may not cover all  possible information. If you have questions about this medicine, talk to your doctor, pharmacist, or health care provider.  2020 Elsevier/Gold Standard (2017-04-19 18:06:11)  

## 2019-09-18 ENCOUNTER — Encounter: Payer: Self-pay | Admitting: Hematology and Oncology

## 2019-09-18 ENCOUNTER — Inpatient Hospital Stay: Payer: Medicare HMO

## 2019-09-18 ENCOUNTER — Other Ambulatory Visit: Payer: Self-pay

## 2019-09-18 ENCOUNTER — Inpatient Hospital Stay: Payer: Medicare HMO | Admitting: Hematology and Oncology

## 2019-09-18 VITALS — BP 126/66 | HR 80 | Temp 98.2°F | Resp 18 | Ht 60.0 in | Wt 118.8 lb

## 2019-09-18 DIAGNOSIS — D61818 Other pancytopenia: Secondary | ICD-10-CM

## 2019-09-18 DIAGNOSIS — G893 Neoplasm related pain (acute) (chronic): Secondary | ICD-10-CM

## 2019-09-18 DIAGNOSIS — C800 Disseminated malignant neoplasm, unspecified: Secondary | ICD-10-CM

## 2019-09-18 DIAGNOSIS — R971 Elevated cancer antigen 125 [CA 125]: Secondary | ICD-10-CM

## 2019-09-18 DIAGNOSIS — Z7901 Long term (current) use of anticoagulants: Secondary | ICD-10-CM | POA: Diagnosis not present

## 2019-09-18 DIAGNOSIS — C786 Secondary malignant neoplasm of retroperitoneum and peritoneum: Secondary | ICD-10-CM | POA: Diagnosis not present

## 2019-09-18 DIAGNOSIS — C562 Malignant neoplasm of left ovary: Secondary | ICD-10-CM

## 2019-09-18 DIAGNOSIS — Z5111 Encounter for antineoplastic chemotherapy: Secondary | ICD-10-CM | POA: Diagnosis not present

## 2019-09-18 DIAGNOSIS — Z7189 Other specified counseling: Secondary | ICD-10-CM

## 2019-09-18 DIAGNOSIS — R188 Other ascites: Secondary | ICD-10-CM | POA: Diagnosis not present

## 2019-09-18 DIAGNOSIS — I480 Paroxysmal atrial fibrillation: Secondary | ICD-10-CM

## 2019-09-18 DIAGNOSIS — Z79899 Other long term (current) drug therapy: Secondary | ICD-10-CM | POA: Diagnosis not present

## 2019-09-18 DIAGNOSIS — I4891 Unspecified atrial fibrillation: Secondary | ICD-10-CM | POA: Diagnosis not present

## 2019-09-18 LAB — CMP (CANCER CENTER ONLY)
ALT: 28 U/L (ref 0–44)
AST: 40 U/L (ref 15–41)
Albumin: 3.3 g/dL — ABNORMAL LOW (ref 3.5–5.0)
Alkaline Phosphatase: 69 U/L (ref 38–126)
Anion gap: 11 (ref 5–15)
BUN: 27 mg/dL — ABNORMAL HIGH (ref 8–23)
CO2: 20 mmol/L — ABNORMAL LOW (ref 22–32)
Calcium: 10.5 mg/dL — ABNORMAL HIGH (ref 8.9–10.3)
Chloride: 108 mmol/L (ref 98–111)
Creatinine: 0.86 mg/dL (ref 0.44–1.00)
GFR, Est AFR Am: >60 mL/min (ref >60)
GFR, Estimated: >60 mL/min (ref >60)
Glucose, Bld: 126 mg/dL — ABNORMAL HIGH (ref 70–99)
Potassium: 3.9 mmol/L (ref 3.5–5.1)
Sodium: 139 mmol/L (ref 135–145)
Total Bilirubin: <0.2 mg/dL — ABNORMAL LOW (ref 0.3–1.2)
Total Protein: 8.4 g/dL — ABNORMAL HIGH (ref 6.5–8.1)

## 2019-09-18 LAB — CBC WITH DIFFERENTIAL (CANCER CENTER ONLY)
Abs Immature Granulocytes: 0.03 10*3/uL (ref 0.00–0.07)
Basophils Absolute: 0 10*3/uL (ref 0.0–0.1)
Basophils Relative: 1 %
Eosinophils Absolute: 0 10*3/uL (ref 0.0–0.5)
Eosinophils Relative: 1 %
HCT: 31.1 % — ABNORMAL LOW (ref 36.0–46.0)
Hemoglobin: 10.1 g/dL — ABNORMAL LOW (ref 12.0–15.0)
Immature Granulocytes: 1 %
Lymphocytes Relative: 36 %
Lymphs Abs: 1.2 10*3/uL (ref 0.7–4.0)
MCH: 30.5 pg (ref 26.0–34.0)
MCHC: 32.5 g/dL (ref 30.0–36.0)
MCV: 94 fL (ref 80.0–100.0)
Monocytes Absolute: 0.6 10*3/uL (ref 0.1–1.0)
Monocytes Relative: 19 %
Neutro Abs: 1.5 10*3/uL — ABNORMAL LOW (ref 1.7–7.7)
Neutrophils Relative %: 42 %
Platelet Count: 439 10*3/uL — ABNORMAL HIGH (ref 150–400)
RBC: 3.31 MIL/uL — ABNORMAL LOW (ref 3.87–5.11)
RDW: 15.2 % (ref 11.5–15.5)
WBC Count: 3.4 10*3/uL — ABNORMAL LOW (ref 4.0–10.5)
nRBC: 0 % (ref 0.0–0.2)

## 2019-09-18 LAB — SAMPLE TO BLOOD BANK

## 2019-09-18 MED ORDER — PROCHLORPERAZINE MALEATE 10 MG PO TABS
10.0000 mg | ORAL_TABLET | Freq: Once | ORAL | Status: AC
  Administered 2019-09-18: 10 mg via ORAL

## 2019-09-18 MED ORDER — PROCHLORPERAZINE MALEATE 10 MG PO TABS
ORAL_TABLET | ORAL | Status: AC
  Filled 2019-09-18: qty 1

## 2019-09-18 MED ORDER — LIDOCAINE-PRILOCAINE 2.5-2.5 % EX CREA: 1.0000 "application " | TOPICAL_CREAM | Freq: Every day | CUTANEOUS | 3 refills | Status: DC | PRN

## 2019-09-18 MED ORDER — SODIUM CHLORIDE 0.9 % IV SOLN
800.0000 mg/m2 | Freq: Once | INTRAVENOUS | Status: AC
  Administered 2019-09-18: 1216 mg via INTRAVENOUS
  Filled 2019-09-18: qty 31.98

## 2019-09-18 MED ORDER — SODIUM CHLORIDE 0.9 % IV SOLN
Freq: Once | INTRAVENOUS | Status: AC
  Filled 2019-09-18: qty 250

## 2019-09-18 NOTE — Assessment & Plan Note (Signed)
She will continue to take pain medicine as needed °

## 2019-09-18 NOTE — Assessment & Plan Note (Signed)
She has no signs or symptoms of congestive heart failure I recommend holding Eliquis for 48 hours prior to port placement

## 2019-09-18 NOTE — Progress Notes (Signed)
[facility] OFFICE PROGRESS NOTE  Patient Care Team: Prochnau, Caroline, MD as PCP - General (Internal Medicine) Tobb, Kardie, DO as PCP - Cardiology (Cardiology) Hess, Karen R, RN as Oncology Nurse Navigator (Oncology) Tucker, Katherine R, MD as Consulting Physician (Gynecologic Oncology)  ASSESSMENT & PLAN:  Ovarian cancer, left (HCC) So far, she tolerated treatment well except for pancytopenia and fatigue Her abdominal exam is benign I recommend a few more cycles of treatment before repeat imaging study Due to poor venous access, I recommend port placement next week  Pancytopenia, acquired (HCC) She tolerated treatment well Her anemia is stable We will monitor carefully and proceed with chemo without delay She does not need transfusion support  Cancer associated pain She will continue to take pain medicine as needed  Atrial fibrillation (HCC) She has no signs or symptoms of congestive heart failure I recommend holding Eliquis for 48 hours prior to port placement   Orders Placed This Encounter  Procedures  . IR IMAGING GUIDED PORT INSERTION    Standing Status:   Future    Standing Expiration Date:   09/17/2020    Order Specific Question:   Reason for Exam (SYMPTOM  OR DIAGNOSIS REQUIRED)    Answer:   need port for chemo on 8/25    Order Specific Question:   Preferred Imaging Location?    Answer:    Hospital    All questions were answered. The patient knows to call the clinic with any problems, questions or concerns. The total time spent in the appointment was 20 minutes encounter with patients including review of chart and various tests results, discussions about plan of care and coordination of care plan   Ni Gorsuch, MD 09/18/2019 9:33 AM  INTERVAL HISTORY: Please see below for problem oriented charting. She is seen prior to day 8, cycle 2 of therapy She tolerated treatment well She has occasional abdominal pain that comes and goes No  nausea or changes in bowel habits The patient denies any recent signs or symptoms of bleeding such as spontaneous epistaxis, hematuria or hematochezia. 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     Ovarian cancer, left (HCC)  02/07/2019 Imaging   Outside CT chest showed no evidence of metastatic disease   03/20/2019 Imaging   Outside Ct abdomen and pelvis showed diffuse carcinomatosis, left adnexa mass and ascites   04/11/2019 Pathology Results   FINAL MICROSCOPIC DIAGNOSIS:   A. OMENTUM, LEFT ABDOMINAL, NEEDLE CORE BIOPSY:  - Metastatic carcinoma.  See comment    COMMENT:   Immunohistochemical stains show that the tumor cells are positive for PAX8, ER, WT1, CK7 (focal) and CK 5/6 (patchy); and negative for p63, calretinin, D2-40, CK20, GATA3 and CDX2.  This mmunohistochemical profile is consistent with a gynecologic primary and suggestive of a low-grade serous carcinoma.   04/11/2019 Procedure   Image guided core biopsy of the omental disease. In addition, small amount of ascites was collected for cytology   04/18/2019 Cancer Staging   Staging form: Ovary, Fallopian Tube, and Primary Peritoneal Carcinoma, AJCC 8th Edition - Clinical stage from 04/18/2019: FIGO Stage IIIC (cT3c, cN0, cM0) - Signed by Gorsuch, Ni, MD on 04/18/2019   04/18/2019 Tumor Marker   Patient's tumor was   tested for the following markers: CA-125 Results of the tumor marker test revealed 402   04/22/2019 - 06/24/2019 Chemotherapy   The patient had carboplatin and taxol x 4 for chemotherapy treatment.     05/13/2019 Tumor Marker   Patient's tumor was tested for the following markers: CA-125 Results of the tumor marker test revealed 146   06/12/2019  Imaging   2.6 cm left adnexal mass in this patient with known ovarian cancer, previously 3.3 cm.   Improving peritoneal disease/omental caking, as above.   No abdominopelvic ascites.   06/24/2019 Tumor Marker   Patient's tumor was tested for the following markers: CA-125 Results of the tumor marker test revealed 16.9   07/23/2019 Pathology Results   A. OMENTUM, EXCISION:  - Metastatic carcinoma to omentum, 29.6 cm, consistent with history of low-grade serous carcinoma   B. SMALL BOWEL, EXCISION:  - Metastatic low-grade serous carcinoma, 4.6 cm, involving small intestinal serosa with associated perforation  - Additional deposits of metastatic carcinoma within the mesentery  - Margins are negative   IHC:  Estrogen Receptor: 70%, MODERATE STAINING INTENSITY  Progesterone Receptor: 10%, STRONG STAINING   07/24/2019 Surgery   Pre-operative Diagnosis: Metastatic carcinoma of GYN origin s/p 4 cycles of NACT   Post-operative Diagnosis: same, suspected ovarian vs primary peritoneal carcinoma   Operation: Diagnostic laparotomy with conversion to exploratory laparotomy, resection of large omental cake, small bowel resection and reanastomosis   Surgeon: Tucker, Katherine MD    Operative Findings:  : On EUA, small mobile uterus, some nodularity appreciated in the cul-de-sac.  On intra-abdominal entry, significant disease burden noted including large omental cake adherent to the anterior abdominal wall, extensive diaphragmatic disease (right greater than left), adhesions between the liver and the diaphragm, disease within the porta hepatis.  After conversion to exploratory laparotomy, additional disease was noted on the stomach and significant disease burden on the sigmoid and rectum, both the colon and mesentery.  All of this disease was diffuse but small volume.  Additionally there were miliary implants along much of the abdominal peritoneum.  Uterus was 4-6 cm and normal in appearance.  Right  fallopian tube and ovary atrophic and normal appearing, left ovary mildly enlarged and adherent to the sidewall with what appeared to be some tumor studding.  Small tumor implants were noted over much of the small bowel mesentery with 2 loops of small bowel adherent to the underside of the omental cake.  Appendix with tumor burden and adherent to the right sidewall.  Transverse colon significantly adherent to the omental cake which measured approximately 25 x 10 cm. Suboptimal resection due to burden of disease.  Given inability to resect, resection of tumor burden thought to be causing patient symptoms was performed   08/20/2019 Imaging   1. Interval progression of peritoneal disease. Although there is been and expected reduction in volume of omental cake status post debulking surgery there has been progressive peritoneal disease throughout the remaining portions of the abdomen and pelvis. 2. Increase in size of left adnexal mass. 3. Signs of serosal involvement of the distal small bowel, cecum and descending colon. There is also been progressive tumor infiltration throughout the sigmoid mesocolon within the pelvis. 4. Aortic atherosclerosis.     08/20/2019 Tumor Marker   Patient's tumor was tested for the following markers: CA-125 Results of the tumor marker test revealed 52.3   08/21/2019 -  Chemotherapy   The patient had gemcitabine for chemotherapy treatment.      Genetic Testing     Patient has genetic testing done for MMR. Results revealed patient has the following: MMR - normal     REVIEW OF SYSTEMS:   Constitutional: Denies fevers, chills or abnormal weight loss Eyes: Denies blurriness of vision Ears, nose, mouth, throat, and face: Denies mucositis or sore throat Respiratory: Denies cough, dyspnea or wheezes Cardiovascular: Denies palpitation, chest discomfort or lower extremity swelling Gastrointestinal:  Denies nausea, heartburn or change in bowel habits Skin: Denies abnormal  skin rashes Lymphatics: Denies new lymphadenopathy or easy bruising Neurological:Denies numbness, tingling or new weaknesses Behavioral/Psych: Mood is stable, no new changes  All other systems were reviewed with the patient and are negative.  I have reviewed the past medical history, past surgical history, social history and family history with the patient and they are unchanged from previous note.  ALLERGIES:  is allergic to codeine sulfate [codeine], tetanus toxoids, horse-derived products, and sulfa antibiotics.  MEDICATIONS:  Current Outpatient Medications  Medication Sig Dispense Refill  . acetaminophen (TYLENOL) 500 MG tablet Take 500-1,000 mg by mouth every 6 (six) hours as needed (for pain.).    Marland Kitchen docusate sodium (COLACE) 100 MG capsule Take 1 capsule (100 mg total) by mouth 2 (two) times daily. 30 capsule 2  . ELIQUIS 5 MG TABS tablet Take 5 mg by mouth 2 (two) times daily.    Marland Kitchen lidocaine-prilocaine (EMLA) cream Apply 1 application topically daily as needed. 30 g 3  . metoprolol succinate (TOPROL XL) 25 MG 24 hr tablet Take 1.5 tablets (37.5 mg total) by mouth daily. 135 tablet 1  . omeprazole (PRILOSEC) 20 MG capsule Take 20 mg by mouth 2 (two) times daily before a meal.     . ondansetron (ZOFRAN) 4 MG tablet Take 4 mg by mouth every 8 (eight) hours as needed for nausea or vomiting. PRN for nausea    . prochlorperazine (COMPAZINE) 10 MG tablet Take 10 mg by mouth every 6 (six) hours as needed for nausea or vomiting. PRN for nausea     No current facility-administered medications for this visit.    PHYSICAL EXAMINATION: ECOG PERFORMANCE STATUS: 1 - Symptomatic but completely ambulatory  Vitals:   09/18/19 0914  BP: 126/66  Pulse: 80  Resp: 18  Temp: 98.2 F (36.8 C)  SpO2: 100%   Filed Weights   09/18/19 0914  Weight: 118 lb 12.8 oz (53.9 kg)    GENERAL:alert, no distress and comfortable SKIN: skin color, texture, turgor are normal, no rashes or significant  lesions EYES: normal, Conjunctiva are pink and non-injected, sclera clear OROPHARYNX:no exudate, no erythema and lips, buccal mucosa, and tongue normal  NECK: supple, thyroid normal size, non-tender, without nodularity LYMPH:  no palpable lymphadenopathy in the cervical, axillary or inguinal LUNGS: clear to auscultation and percussion with normal breathing effort HEART: regular rate & rhythm and no murmurs and no lower extremity edema ABDOMEN:abdomen soft, non-tender and normal bowel sounds Musculoskeletal:no cyanosis of digits and no clubbing  NEURO: alert & oriented x 3 with fluent speech, no focal motor/sensory deficits  LABORATORY DATA:  I have reviewed the data as listed    Component Value Date/Time   NA 137 09/11/2019 1238   NA 137 03/04/2019 1107   K 4.3 09/11/2019 1238   CL 106 09/11/2019 1238   CO2 20 (L) 09/11/2019 1238   GLUCOSE 84 09/11/2019 1238   BUN 25 (H) 09/11/2019 1238   BUN 11 03/04/2019 1107   CREATININE 0.83 09/11/2019 1238   CALCIUM 10.1 09/11/2019 1238   PROT 8.3 (  H) 09/11/2019 1238   PROT 7.4 03/04/2019 1107   ALBUMIN 3.4 (L) 09/11/2019 1238   ALBUMIN 3.6 03/04/2019 1107   AST 24 09/11/2019 1238   ALT 14 09/11/2019 1238   ALKPHOS 67 09/11/2019 1238   BILITOT 0.2 (L) 09/11/2019 1238   GFRNONAA >60 09/11/2019 1238   GFRAA >60 09/11/2019 1238    No results found for: SPEP, UPEP  Lab Results  Component Value Date   WBC 3.4 (L) 09/18/2019   NEUTROABS 1.5 (L) 09/18/2019   HGB 10.1 (L) 09/18/2019   HCT 31.1 (L) 09/18/2019   MCV 94.0 09/18/2019   PLT 439 (H) 09/18/2019      Chemistry      Component Value Date/Time   NA 137 09/11/2019 1238   NA 137 03/04/2019 1107   K 4.3 09/11/2019 1238   CL 106 09/11/2019 1238   CO2 20 (L) 09/11/2019 1238   BUN 25 (H) 09/11/2019 1238   BUN 11 03/04/2019 1107   CREATININE 0.83 09/11/2019 1238      Component Value Date/Time   CALCIUM 10.1 09/11/2019 1238   ALKPHOS 67 09/11/2019 1238   AST 24 09/11/2019  1238   ALT 14 09/11/2019 1238   BILITOT 0.2 (L) 09/11/2019 1238       

## 2019-09-18 NOTE — Assessment & Plan Note (Signed)
So far, she tolerated treatment well except for pancytopenia and fatigue Her abdominal exam is benign I recommend a few more cycles of treatment before repeat imaging study Due to poor venous access, I recommend port placement next week

## 2019-09-18 NOTE — Assessment & Plan Note (Signed)
She tolerated treatment well Her anemia is stable We will monitor carefully and proceed with chemo without delay She does not need transfusion support

## 2019-09-18 NOTE — Telephone Encounter (Signed)
Yes that will be perfectly fine you can reschedule her appointment.  Yes she can stop her Eliquis for 2 days prior to her procedure.

## 2019-09-18 NOTE — Patient Instructions (Signed)
[facility] Discharge Instructions for Patients Receiving Chemotherapy  Today you received the following chemotherapy agents: gemcitabine.  To help prevent nausea and vomiting after your treatment, we encourage you to take your nausea medication as directed.   If you develop nausea and vomiting that is not controlled by your nausea medication, call the clinic.   BELOW ARE SYMPTOMS THAT SHOULD BE REPORTED IMMEDIATELY:  *FEVER GREATER THAN 100.5 F  *CHILLS WITH OR WITHOUT FEVER  NAUSEA AND VOMITING THAT IS NOT CONTROLLED WITH YOUR NAUSEA MEDICATION  *UNUSUAL SHORTNESS OF BREATH  *UNUSUAL BRUISING OR BLEEDING  TENDERNESS IN MOUTH AND THROAT WITH OR WITHOUT PRESENCE OF ULCERS  *URINARY PROBLEMS  *BOWEL PROBLEMS  UNUSUAL RASH Items with * indicate a potential emergency and should be followed up as soon as possible.  Feel free to call the clinic should you have any questions or concerns. The clinic phone number is [phone].  Please show the CHEMO ALERT CARD at check-in to the Emergency Department and triage nurse.   

## 2019-09-19 LAB — CA 125: Cancer Antigen (CA) 125: 45 U/mL — ABNORMAL HIGH (ref 0.0–38.1)

## 2019-09-27 ENCOUNTER — Encounter: Payer: Self-pay | Admitting: Hematology and Oncology

## 2019-09-27 ENCOUNTER — Telehealth: Payer: Self-pay

## 2019-09-27 NOTE — Telephone Encounter (Signed)
Whitney Floyd called back. She declined the appt for today. She feels a little better and will try the suggestions from Dr. Alvy Bimler. They will call the office back if appt needed.

## 2019-09-27 NOTE — Telephone Encounter (Signed)
-----   Message from Heath Lark, MD sent at 09/27/2019  8:26 AM EDT ----- Offer her an appt at 2 pm Take 2 tramadol with tylenol. Also, if she has old dexamethasone, take one with food

## 2019-09-27 NOTE — Telephone Encounter (Signed)
Called and given below message to [place]. She verbalized understanding. She does have the dexamethasone Rx and take one. Butch Penny will check on Ms. Alcaide and call the office back regarding offer of appt after she sees how she doing now.

## 2019-09-30 ENCOUNTER — Other Ambulatory Visit: Payer: Self-pay | Admitting: Radiology

## 2019-10-01 ENCOUNTER — Telehealth: Payer: Self-pay | Admitting: Oncology

## 2019-10-01 ENCOUNTER — Other Ambulatory Visit: Payer: Self-pay | Admitting: Radiology

## 2019-10-01 ENCOUNTER — Ambulatory Visit (HOSPITAL_COMMUNITY)
Admission: RE | Admit: 2019-10-01 | Discharge: 2019-10-01 | Disposition: A | Discharge status: Home / Self Care | Payer: Medicare HMO | Source: Ambulatory Visit | Attending: Hematology and Oncology | Admitting: Hematology and Oncology

## 2019-10-01 ENCOUNTER — Encounter (HOSPITAL_COMMUNITY): Payer: Self-pay

## 2019-10-01 ENCOUNTER — Other Ambulatory Visit: Payer: Self-pay

## 2019-10-01 DIAGNOSIS — E785 Hyperlipidemia, unspecified: Secondary | ICD-10-CM | POA: Diagnosis not present

## 2019-10-01 DIAGNOSIS — C541 Malignant neoplasm of endometrium: Secondary | ICD-10-CM | POA: Diagnosis present

## 2019-10-01 DIAGNOSIS — C562 Malignant neoplasm of left ovary: Secondary | ICD-10-CM

## 2019-10-01 DIAGNOSIS — Z79899 Other long term (current) drug therapy: Secondary | ICD-10-CM | POA: Diagnosis not present

## 2019-10-01 DIAGNOSIS — I480 Paroxysmal atrial fibrillation: Secondary | ICD-10-CM | POA: Insufficient documentation

## 2019-10-01 DIAGNOSIS — I700 Atherosclerosis of aorta: Secondary | ICD-10-CM | POA: Diagnosis not present

## 2019-10-01 DIAGNOSIS — Z8711 Personal history of peptic ulcer disease: Secondary | ICD-10-CM | POA: Insufficient documentation

## 2019-10-01 DIAGNOSIS — Z452 Encounter for adjustment and management of vascular access device: Secondary | ICD-10-CM | POA: Diagnosis not present

## 2019-10-01 DIAGNOSIS — K219 Gastro-esophageal reflux disease without esophagitis: Secondary | ICD-10-CM | POA: Diagnosis not present

## 2019-10-01 DIAGNOSIS — D649 Anemia, unspecified: Secondary | ICD-10-CM | POA: Insufficient documentation

## 2019-10-01 DIAGNOSIS — Z7901 Long term (current) use of anticoagulants: Secondary | ICD-10-CM | POA: Diagnosis not present

## 2019-10-01 DIAGNOSIS — Z5111 Encounter for antineoplastic chemotherapy: Secondary | ICD-10-CM | POA: Diagnosis not present

## 2019-10-01 DIAGNOSIS — C569 Malignant neoplasm of unspecified ovary: Secondary | ICD-10-CM | POA: Diagnosis not present

## 2019-10-01 HISTORY — PX: IR IMAGING GUIDED PORT INSERTION: IMG5740

## 2019-10-01 LAB — COMPREHENSIVE METABOLIC PANEL
ALT: 19 U/L (ref 0–44)
AST: 28 U/L (ref 15–41)
Albumin: 3.6 g/dL (ref 3.5–5.0)
Alkaline Phosphatase: 58 U/L (ref 38–126)
Anion gap: 13 (ref 5–15)
BUN: 20 mg/dL (ref 8–23)
CO2: 22 mmol/L (ref 22–32)
Calcium: 9.3 mg/dL (ref 8.9–10.3)
Chloride: 102 mmol/L (ref 98–111)
Creatinine, Ser: 0.83 mg/dL (ref 0.44–1.00)
GFR calc Af Amer: >60 mL/min (ref >60)
GFR calc non Af Amer: >60 mL/min (ref >60)
Glucose, Bld: 113 mg/dL — ABNORMAL HIGH (ref 70–99)
Potassium: 4.3 mmol/L (ref 3.5–5.1)
Sodium: 137 mmol/L (ref 135–145)
Total Bilirubin: 0.4 mg/dL (ref 0.3–1.2)
Total Protein: 7.7 g/dL (ref 6.5–8.1)

## 2019-10-01 LAB — CBC WITH DIFFERENTIAL/PLATELET
Abs Immature Granulocytes: 0.03 10*3/uL (ref 0.00–0.07)
Basophils Absolute: 0 10*3/uL (ref 0.0–0.1)
Basophils Relative: 1 %
Eosinophils Absolute: 0.1 10*3/uL (ref 0.0–0.5)
Eosinophils Relative: 1 %
HCT: 32.1 % — ABNORMAL LOW (ref 36.0–46.0)
Hemoglobin: 10.2 g/dL — ABNORMAL LOW (ref 12.0–15.0)
Immature Granulocytes: 0 %
Lymphocytes Relative: 18 %
Lymphs Abs: 1.5 10*3/uL (ref 0.7–4.0)
MCH: 31.1 pg (ref 26.0–34.0)
MCHC: 31.8 g/dL (ref 30.0–36.0)
MCV: 97.9 fL (ref 80.0–100.0)
Monocytes Absolute: 1.1 10*3/uL — ABNORMAL HIGH (ref 0.1–1.0)
Monocytes Relative: 13 %
Neutro Abs: 5.5 10*3/uL (ref 1.7–7.7)
Neutrophils Relative %: 67 %
Platelets: 346 10*3/uL (ref 150–400)
RBC: 3.28 MIL/uL — ABNORMAL LOW (ref 3.87–5.11)
RDW: 16.4 % — ABNORMAL HIGH (ref 11.5–15.5)
WBC: 8.2 10*3/uL (ref 4.0–10.5)
nRBC: 0 % (ref 0.0–0.2)

## 2019-10-01 LAB — PROTIME-INR
INR: 1 (ref 0.8–1.2)
Prothrombin Time: 12.9 seconds (ref 11.4–15.2)

## 2019-10-01 MED ORDER — HEPARIN SOD (PORK) LOCK FLUSH 100 UNIT/ML IV SOLN
INTRAVENOUS | Status: AC | PRN
  Administered 2019-10-01: 500 [IU] via INTRAVENOUS

## 2019-10-01 MED ORDER — LIDOCAINE-EPINEPHRINE 1 %-1:100000 IJ SOLN
INTRAMUSCULAR | Status: AC | PRN
  Administered 2019-10-01: 10 mL

## 2019-10-01 MED ORDER — HEPARIN SOD (PORK) LOCK FLUSH 100 UNIT/ML IV SOLN
INTRAVENOUS | Status: AC
  Filled 2019-10-01: qty 5

## 2019-10-01 MED ORDER — SODIUM CHLORIDE 0.9 % IV SOLN: INTRAVENOUS | Status: DC

## 2019-10-01 MED ORDER — CEFAZOLIN SODIUM-DEXTROSE 2-4 GM/100ML-% IV SOLN
INTRAVENOUS | Status: AC
  Filled 2019-10-01: qty 100

## 2019-10-01 MED ORDER — LIDOCAINE-EPINEPHRINE 1 %-1:100000 IJ SOLN
INTRAMUSCULAR | Status: AC
  Filled 2019-10-01: qty 1

## 2019-10-01 MED ORDER — FENTANYL CITRATE (PF) 100 MCG/2ML IJ SOLN
INTRAMUSCULAR | Status: AC
Start: 2019-10-01 — End: 2019-10-02
  Filled 2019-10-01: qty 2

## 2019-10-01 MED ORDER — MIDAZOLAM HCL 2 MG/2ML IJ SOLN
INTRAMUSCULAR | Status: AC
  Filled 2019-10-01: qty 2

## 2019-10-01 MED ORDER — CEFAZOLIN SODIUM-DEXTROSE 2-4 GM/100ML-% IV SOLN
2.0000 g | Freq: Once | INTRAVENOUS | Status: AC
  Administered 2019-10-01: 2 g via INTRAVENOUS

## 2019-10-01 MED ORDER — FENTANYL CITRATE (PF) 100 MCG/2ML IJ SOLN
INTRAMUSCULAR | Status: AC | PRN
  Administered 2019-10-01 (×2): 50 ug via INTRAVENOUS

## 2019-10-01 MED ORDER — MIDAZOLAM HCL 2 MG/2ML IJ SOLN
INTRAMUSCULAR | Status: AC | PRN
  Administered 2019-10-01 (×2): 1 mg via INTRAVENOUS

## 2019-10-01 NOTE — Telephone Encounter (Signed)
Requested a CBC p diff and CMP to be drawn with port placement per Dr. Alvy Bimler with [facility] IR.

## 2019-10-01 NOTE — Discharge Instructions (Signed)

## 2019-10-01 NOTE — Procedures (Signed)
Interventional Radiology Procedure Note  Procedure: Placement of a right IJ approach single lumen PowerPort.  Tip is positioned at the superior cavoatrial junction and catheter is ready for immediate use.  Complications: No immediate Recommendations:  - Ok to shower tomorrow - Do not submerge for 7 days - Routine line care   Signed,  Marita Burnsed K. Navjot Loera, MD   

## 2019-10-01 NOTE — Telephone Encounter (Signed)
Called Whitney Floyd and advised her that [name]'s lab and flush appointment has been canceled for tomorrow.  She just needs to arrive for her infusion at 8:45.  She verbalized understanding and agreement.

## 2019-10-01 NOTE — H&P (Signed)
Referring Physician(s): Heath Lark  Supervising Physician: Jacqulynn Cadet  Patient Status:  [facility] OP  Chief Complaint: "I'm getting a port a cath"   Subjective: Patient familiar to IR service from omental biopsy and paracentesis on 04/11/2019.  She has a history of progressive ovarian cancer with poor venous access.  She presents today for Port-A-Cath placement for chemotherapy.  She currently denies fever, headache, chest pain, dyspnea, cough, back pain, nausea, vomiting or bleeding.  She does have some occasional abdominal discomfort, occ right upper extremity arthritic type pain and is HOH.  Additional history as below.  Past Medical History:  Diagnosis Date  . Anemia   . Aortic atherosclerosis ([place])   . Arthritis   . Atrial fibrillation ([place])   . 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 Carcinomatosis ([place]) dx'd 04/11/19   ovarian  . Cataract   . Diverticulosis   . Dysrhythmia    a fib  . GERD (gastroesophageal reflux disease)   . Headache    hx of migraines  . Hyperlipidemia   . Osteopenia   . Paroxysmal A-fib ([place]) 02/08/2019  . Peptic ulcer disease    Past Surgical History:  Procedure Laterality Date  . benign bone tumor removal    . CATARACT EXTRACTION, BILATERAL    . COLONOSCOPY    . TUBAL LIGATION        Allergies: Codeine sulfate [codeine], Tetanus toxoids, Horse-derived products, and Sulfa antibiotics  Medications: Prior to Admission medications   Medication Sig Start Date End Date Taking? Authorizing Provider  acetaminophen (TYLENOL) 500 MG tablet Take 500-1,000 mg by mouth every 6 (six) hours as needed (for pain.).    [provider]  docusate sodium (COLACE) 100 MG capsule Take 1 capsule (100 mg total) by mouth 2 (two) times daily. 07/28/19   Lafonda Mosses, MD  ELIQUIS 5 MG TABS tablet Take 5 mg by mouth 2 (two) times daily. 02/08/19   [provider]  lidocaine-prilocaine (EMLA) cream Apply 1 application topically daily  as needed. 09/18/19   Heath Lark, MD  metoprolol succinate (TOPROL XL) 25 MG 24 hr tablet Take 1.5 tablets (37.5 mg total) by mouth daily. 04/17/19   Tobb, Kardie, DO  omeprazole (PRILOSEC) 20 MG capsule Take 20 mg by mouth 2 (two) times daily before a meal.     [provider]  ondansetron (ZOFRAN) 4 MG tablet Take 4 mg by mouth every 8 (eight) hours as needed for nausea or vomiting. PRN for nausea    [provider]  prochlorperazine (COMPAZINE) 10 MG tablet Take 10 mg by mouth every 6 (six) hours as needed for nausea or vomiting. PRN for nausea    [provider]     Vital Signs: Blood pressure 132/79, temp 98.2, heart rate 89, respirations 18, O2 sat 100% room air   Physical Exam awake, alert.  Chest clear to auscultation bilaterally.  Heart with regular rate and rhythm.  Abdomen soft, positive bowel sounds, some mild diffuse tenderness to palpation.  No lower extremity edema.  Imaging: No results found.  Labs:  CBC: Recent Labs    08/20/19 0900 08/28/19 0859 09/11/19 1238 09/18/19 0850  WBC 9.3 4.5 7.0 3.4*  HGB 10.0* 10.2* 10.6* 10.1*  HCT 32.6* 31.9* 33.4* 31.1*  PLT 330 227 442* 439*    COAGS: Recent Labs    04/11/19 0730  INR 1.0    BMP: Recent Labs    08/20/19 0900 08/28/19 0859 09/11/19 1238  09/18/19 0850  NA 140 139 137 139  K 3.9 3.8 4.3 3.9  CL 105 107 106 108  CO2 24 20* 20* 20*  GLUCOSE 105* 132* 84 126*  BUN 14 20 25* 27*  CALCIUM 9.3 9.9 10.1 10.5*  CREATININE 0.84 0.91 0.83 0.86  GFRNONAA >60 58* >60 >60  GFRAA >60 >60 >60 >60    LIVER FUNCTION TESTS: Recent Labs    08/20/19 0900 08/28/19 0859 09/11/19 1238 09/18/19 0850  BILITOT 0.3 <0.2* 0.2* <0.2*  AST 21 23 24  40  ALT 11 15 14 28   ALKPHOS 76 77 67 69  PROT 7.8 8.4* 8.3* 8.4*  ALBUMIN 3.1* 3.2* 3.4* 3.3*    Assessment and Plan: 85 yo female with history of progressive ovarian cancer and poor venous access.  She presents today for Port-A-Cath  placement for chemotherapy. Risks and benefits of image guided port-a-catheter placement was discussed with the patient including, but not limited to bleeding, infection, pneumothorax, or fibrin sheath development and need for additional procedures.  All of the patient's questions were answered, patient is agreeable to proceed. Consent signed and in chart.     Electronically Signed: D. Rowe Robert, PA-C 10/01/2019, 1:19 PM   I spent a total of 25 minutes at the the patient's bedside AND on the patient's hospital floor or unit, greater than 50% of which was counseling/coordinating care for Port-A-Cath placement

## 2019-10-02 ENCOUNTER — Other Ambulatory Visit: Payer: Self-pay

## 2019-10-02 ENCOUNTER — Other Ambulatory Visit: Payer: Medicare HMO

## 2019-10-02 ENCOUNTER — Other Ambulatory Visit: Payer: Self-pay | Admitting: Hematology and Oncology

## 2019-10-02 ENCOUNTER — Inpatient Hospital Stay: Payer: Medicare HMO

## 2019-10-02 VITALS — BP 135/77 | HR 93 | Temp 97.9°F | Resp 18 | Ht 62.0 in | Wt 122.2 lb

## 2019-10-02 DIAGNOSIS — Z79899 Other long term (current) drug therapy: Secondary | ICD-10-CM | POA: Diagnosis not present

## 2019-10-02 DIAGNOSIS — C562 Malignant neoplasm of left ovary: Secondary | ICD-10-CM | POA: Diagnosis not present

## 2019-10-02 DIAGNOSIS — Z7901 Long term (current) use of anticoagulants: Secondary | ICD-10-CM | POA: Diagnosis not present

## 2019-10-02 DIAGNOSIS — G893 Neoplasm related pain (acute) (chronic): Secondary | ICD-10-CM | POA: Diagnosis not present

## 2019-10-02 DIAGNOSIS — R188 Other ascites: Secondary | ICD-10-CM | POA: Diagnosis not present

## 2019-10-02 DIAGNOSIS — C800 Disseminated malignant neoplasm, unspecified: Secondary | ICD-10-CM

## 2019-10-02 DIAGNOSIS — Z7189 Other specified counseling: Secondary | ICD-10-CM

## 2019-10-02 DIAGNOSIS — D61818 Other pancytopenia: Secondary | ICD-10-CM | POA: Diagnosis not present

## 2019-10-02 DIAGNOSIS — R971 Elevated cancer antigen 125 [CA 125]: Secondary | ICD-10-CM

## 2019-10-02 DIAGNOSIS — Z5111 Encounter for antineoplastic chemotherapy: Secondary | ICD-10-CM | POA: Diagnosis not present

## 2019-10-02 DIAGNOSIS — I4891 Unspecified atrial fibrillation: Secondary | ICD-10-CM | POA: Diagnosis not present

## 2019-10-02 DIAGNOSIS — C786 Secondary malignant neoplasm of retroperitoneum and peritoneum: Secondary | ICD-10-CM | POA: Diagnosis not present

## 2019-10-02 MED ORDER — HEPARIN SOD (PORK) LOCK FLUSH 100 UNIT/ML IV SOLN
500.0000 [IU] | Freq: Once | INTRAVENOUS | Status: AC | PRN
  Administered 2019-10-02: 500 [IU]
  Filled 2019-10-02: qty 5

## 2019-10-02 MED ORDER — SODIUM CHLORIDE 0.9 % IV SOLN
Freq: Once | INTRAVENOUS | Status: AC
  Filled 2019-10-02: qty 250

## 2019-10-02 MED ORDER — PROCHLORPERAZINE MALEATE 10 MG PO TABS
10.0000 mg | ORAL_TABLET | Freq: Once | ORAL | Status: AC
  Administered 2019-10-02: 10 mg via ORAL

## 2019-10-02 MED ORDER — SODIUM CHLORIDE 0.9 % IV SOLN
800.0000 mg/m2 | Freq: Once | INTRAVENOUS | Status: AC
  Administered 2019-10-02: 1216 mg via INTRAVENOUS
  Filled 2019-10-02: qty 31.98

## 2019-10-02 MED ORDER — SODIUM CHLORIDE 0.9% FLUSH
10.0000 mL | INTRAVENOUS | Status: DC | PRN
  Administered 2019-10-02: 10 mL
  Filled 2019-10-02: qty 10

## 2019-10-02 MED ORDER — PROCHLORPERAZINE MALEATE 10 MG PO TABS
ORAL_TABLET | ORAL | Status: AC
  Filled 2019-10-02: qty 1

## 2019-10-02 NOTE — Patient Instructions (Signed)
[place] Discharge Instructions for Patients Receiving Chemotherapy  Today you received the following chemotherapy agents: Gemcitabine (Gemzar)  To help prevent nausea and vomiting after your treatment, we encourage you to take your nausea medication as prescribed.    If you develop nausea and vomiting that is not controlled by your nausea medication, call the clinic.   BELOW ARE SYMPTOMS THAT SHOULD BE REPORTED IMMEDIATELY:  *FEVER GREATER THAN 100.5 F  *CHILLS WITH OR WITHOUT FEVER  NAUSEA AND VOMITING THAT IS NOT CONTROLLED WITH YOUR NAUSEA MEDICATION  *UNUSUAL SHORTNESS OF BREATH  *UNUSUAL BRUISING OR BLEEDING  TENDERNESS IN MOUTH AND THROAT WITH OR WITHOUT PRESENCE OF ULCERS  *URINARY PROBLEMS  *BOWEL PROBLEMS  UNUSUAL RASH Items with * indicate a potential emergency and should be followed up as soon as possible.  Feel free to call the clinic should you have any questions or concerns. The clinic phone number is [phone].  Please show the [place] at check-in to the Emergency Department and triage nurse.    Implanted [facility] Guide An implanted port is a device that is placed under the skin. It is usually placed in the chest. The device can be used to give IV medicine, to take blood, or for dialysis. You may have an implanted port if:  You need IV medicine that would be irritating to the small veins in your hands or arms.  You need IV medicines, such as antibiotics, for a long period of time.  You need IV nutrition for a long period of time.  You need dialysis. Having a port means that your health care provider will not need to use the veins in your arms for these procedures. You may have fewer limitations when using a port than you would if you used other types of long-term IVs, and you will likely be able to return to normal activities after your incision heals. An implanted port has two main parts:  Reservoir. The reservoir is  the part where a needle is inserted to give medicines or draw blood. The reservoir is round. After it is placed, it appears as a small, raised area under your skin.  Catheter. The catheter is a thin, flexible tube that connects the reservoir to a vein. Medicine that is inserted into the reservoir goes into the catheter and then into the vein. How is my port accessed? To access your port:  A numbing cream may be placed on the skin over the port site.  Your health care provider will put on a mask and sterile gloves.  The skin over your port will be cleaned carefully with a germ-killing soap and allowed to dry.  Your health care provider will gently pinch the port and insert a needle into it.  Your health care provider will check for a blood return to make sure the port is in the vein and is not clogged.  If your port needs to remain accessed to get medicine continuously (constant infusion), your health care provider will place a clear bandage (dressing) over the needle site. The dressing and needle will need to be changed every week, or as told by your health care provider. What is flushing? Flushing helps keep the port from getting clogged. Follow instructions from your health care provider about how and when to flush the port. Ports are usually flushed with saline solution or a medicine called heparin. The need for flushing will depend on how the port is used:  If the  port is only used from time to time to give medicines or draw blood, the port may need to be flushed: ? Before and after medicines have been given. ? Before and after blood has been drawn. ? As part of routine maintenance. Flushing may be recommended every 4-6 weeks.  If a constant infusion is running, the port may not need to be flushed.  Throw away any syringes in a disposal container that is meant for sharp items (sharps container). You can buy a sharps container from a pharmacy, or you can make one by using an empty hard  plastic bottle with a cover. How long will my port stay implanted? The port can stay in for as long as your health care provider thinks it is needed. When it is time for the port to come out, a surgery will be done to remove it. The surgery will be similar to the procedure that was done to put the port in. Follow these instructions at home:   Flush your port as told by your health care provider.  If you need an infusion over several days, follow instructions from your health care provider about how to take care of your port site. Make sure you: ? Wash your hands with soap and water before you change your dressing. If soap and water are not available, use alcohol-based hand sanitizer. ? Change your dressing as told by your health care provider. ? Place any used dressings or infusion bags into a plastic bag. Throw that bag in the trash. ? Keep the dressing that covers the needle clean and dry. Do not get it wet. ? Do not use scissors or sharp objects near the tube. ? Keep the tube clamped, unless it is being used.  Check your port site every day for signs of infection. Check for: ? Redness, swelling, or pain. ? Fluid or blood. ? Pus or a bad smell.  Protect the skin around the port site. ? Avoid wearing bra straps that rub or irritate the site. ? Protect the skin around your port from seat belts. Place a soft pad over your chest if needed.  Bathe or shower as told by your health care provider. The site may get wet as long as you are not actively receiving an infusion.  Return to your normal activities as told by your health care provider. Ask your health care provider what activities are safe for you.  Carry a medical alert card or wear a medical alert bracelet at all times. This will let health care providers know that you have an implanted port in case of an emergency. Get help right away if:  You have redness, swelling, or pain at the port site.  You have fluid or blood coming from  your port site.  You have pus or a bad smell coming from the port site.  You have a fever. Summary  Implanted ports are usually placed in the chest for long-term IV access.  Follow instructions from your health care provider about flushing the port and changing bandages (dressings).  Take care of the area around your port by avoiding clothing that puts pressure on the area, and by watching for signs of infection.  Protect the skin around your port from seat belts. Place a soft pad over your chest if needed.  Get help right away if you have a fever or you have redness, swelling, pain, drainage, or a bad smell at the port site. This information is not intended to  replace advice given to you by your health care provider. Make sure you discuss any questions you have with your health care provider. Document Revised: 05/18/2018 Document Reviewed: 02/27/2016 Elsevier Patient Education  2020 [employer].

## 2019-10-09 ENCOUNTER — Inpatient Hospital Stay: Payer: Medicare HMO | Attending: Gynecologic Oncology

## 2019-10-09 ENCOUNTER — Other Ambulatory Visit: Payer: Self-pay

## 2019-10-09 ENCOUNTER — Inpatient Hospital Stay: Payer: Medicare HMO

## 2019-10-09 ENCOUNTER — Encounter: Payer: Self-pay | Admitting: Hematology and Oncology

## 2019-10-09 ENCOUNTER — Telehealth: Payer: Self-pay | Admitting: Hematology and Oncology

## 2019-10-09 ENCOUNTER — Inpatient Hospital Stay: Payer: Medicare HMO | Admitting: Hematology and Oncology

## 2019-10-09 VITALS — BP 133/71 | HR 80 | Temp 98.3°F | Resp 16 | Ht 62.0 in | Wt 120.2 lb

## 2019-10-09 DIAGNOSIS — C562 Malignant neoplasm of left ovary: Secondary | ICD-10-CM | POA: Insufficient documentation

## 2019-10-09 DIAGNOSIS — R971 Elevated cancer antigen 125 [CA 125]: Secondary | ICD-10-CM

## 2019-10-09 DIAGNOSIS — D61818 Other pancytopenia: Secondary | ICD-10-CM | POA: Diagnosis not present

## 2019-10-09 DIAGNOSIS — C800 Disseminated malignant neoplasm, unspecified: Secondary | ICD-10-CM | POA: Diagnosis not present

## 2019-10-09 DIAGNOSIS — G893 Neoplasm related pain (acute) (chronic): Secondary | ICD-10-CM | POA: Diagnosis not present

## 2019-10-09 DIAGNOSIS — Z5111 Encounter for antineoplastic chemotherapy: Secondary | ICD-10-CM | POA: Diagnosis not present

## 2019-10-09 DIAGNOSIS — Z7189 Other specified counseling: Secondary | ICD-10-CM

## 2019-10-09 DIAGNOSIS — Z7901 Long term (current) use of anticoagulants: Secondary | ICD-10-CM | POA: Diagnosis not present

## 2019-10-09 DIAGNOSIS — C786 Secondary malignant neoplasm of retroperitoneum and peritoneum: Secondary | ICD-10-CM | POA: Insufficient documentation

## 2019-10-09 DIAGNOSIS — Z79899 Other long term (current) drug therapy: Secondary | ICD-10-CM | POA: Insufficient documentation

## 2019-10-09 DIAGNOSIS — Z23 Encounter for immunization: Secondary | ICD-10-CM | POA: Diagnosis not present

## 2019-10-09 DIAGNOSIS — E860 Dehydration: Secondary | ICD-10-CM

## 2019-10-09 LAB — CMP (CANCER CENTER ONLY)
ALT: 24 U/L (ref 0–44)
AST: 31 U/L (ref 15–41)
Albumin: 3.2 g/dL — ABNORMAL LOW (ref 3.5–5.0)
Alkaline Phosphatase: 81 U/L (ref 38–126)
Anion gap: 10 (ref 5–15)
BUN: 15 mg/dL (ref 8–23)
CO2: 24 mmol/L (ref 22–32)
Calcium: 10.5 mg/dL — ABNORMAL HIGH (ref 8.9–10.3)
Chloride: 105 mmol/L (ref 98–111)
Creatinine: 0.79 mg/dL (ref 0.44–1.00)
GFR, Est AFR Am: >60 mL/min
GFR, Estimated: >60 mL/min
Glucose, Bld: 105 mg/dL — ABNORMAL HIGH (ref 70–99)
Potassium: 4.2 mmol/L (ref 3.5–5.1)
Sodium: 139 mmol/L (ref 135–145)
Total Bilirubin: <0.2 mg/dL — ABNORMAL LOW (ref 0.3–1.2)
Total Protein: 7.9 g/dL (ref 6.5–8.1)

## 2019-10-09 LAB — CBC WITH DIFFERENTIAL (CANCER CENTER ONLY)
Abs Immature Granulocytes: 0.02 10*3/uL (ref 0.00–0.07)
Basophils Absolute: 0 10*3/uL (ref 0.0–0.1)
Basophils Relative: 1 %
Eosinophils Absolute: 0.1 10*3/uL (ref 0.0–0.5)
Eosinophils Relative: 1 %
HCT: 29.8 % — ABNORMAL LOW (ref 36.0–46.0)
Hemoglobin: 9.7 g/dL — ABNORMAL LOW (ref 12.0–15.0)
Immature Granulocytes: 0 %
Lymphocytes Relative: 29 %
Lymphs Abs: 1.3 10*3/uL (ref 0.7–4.0)
MCH: 30.4 pg (ref 26.0–34.0)
MCHC: 32.6 g/dL (ref 30.0–36.0)
MCV: 93.4 fL (ref 80.0–100.0)
Monocytes Absolute: 0.9 10*3/uL (ref 0.1–1.0)
Monocytes Relative: 21 %
Neutro Abs: 2.2 10*3/uL (ref 1.7–7.7)
Neutrophils Relative %: 48 %
Platelet Count: 384 10*3/uL (ref 150–400)
RBC: 3.19 MIL/uL — ABNORMAL LOW (ref 3.87–5.11)
RDW: 15.5 % (ref 11.5–15.5)
WBC Count: 4.6 10*3/uL (ref 4.0–10.5)
nRBC: 0 % (ref 0.0–0.2)

## 2019-10-09 MED ORDER — PROCHLORPERAZINE MALEATE 10 MG PO TABS
ORAL_TABLET | ORAL | Status: AC
  Filled 2019-10-09: qty 1

## 2019-10-09 MED ORDER — SODIUM CHLORIDE 0.9 % IV SOLN
Freq: Once | INTRAVENOUS | Status: AC
  Filled 2019-10-09: qty 250

## 2019-10-09 MED ORDER — SODIUM CHLORIDE 0.9% FLUSH
10.0000 mL | INTRAVENOUS | Status: DC | PRN
  Administered 2019-10-09: 10 mL
  Filled 2019-10-09: qty 10

## 2019-10-09 MED ORDER — SODIUM CHLORIDE 0.9% FLUSH
10.0000 mL | Freq: Once | INTRAVENOUS | Status: AC | PRN
  Administered 2019-10-09: 10 mL
  Filled 2019-10-09: qty 10

## 2019-10-09 MED ORDER — PROCHLORPERAZINE MALEATE 10 MG PO TABS
10.0000 mg | ORAL_TABLET | Freq: Once | ORAL | Status: AC
  Administered 2019-10-09: 10 mg via ORAL

## 2019-10-09 MED ORDER — HEPARIN SOD (PORK) LOCK FLUSH 100 UNIT/ML IV SOLN
500.0000 [IU] | Freq: Once | INTRAVENOUS | Status: AC | PRN
  Administered 2019-10-09: 500 [IU]
  Filled 2019-10-09: qty 5

## 2019-10-09 MED ORDER — SODIUM CHLORIDE 0.9 % IV SOLN
800.0000 mg/m2 | Freq: Once | INTRAVENOUS | Status: AC
  Administered 2019-10-09: 1216 mg via INTRAVENOUS
  Filled 2019-10-09: qty 31.98

## 2019-10-09 NOTE — Assessment & Plan Note (Signed)
She had multiple different nonspecific symptoms such as joint pain, abdominal pain and others She is not symptomatic from anemia Given her frail status, I recommend CT imaging study after today's dose of treatment She is in agreement

## 2019-10-09 NOTE — Assessment & Plan Note (Signed)
She will continue to take pain medicine as needed Patient appears reluctant to take pain medicine I encouraged her to take regular acetaminophen and intermittent doses of tramadol as needed

## 2019-10-09 NOTE — Assessment & Plan Note (Signed)
She tolerated treatment well Her anemia is stable We will monitor carefully and proceed with chemo without delay She does not need transfusion support

## 2019-10-09 NOTE — Telephone Encounter (Signed)
Scheduled appt per 9/1 sch msg. Gave pt a print out of AVS.

## 2019-10-09 NOTE — Progress Notes (Signed)
[facility] OFFICE PROGRESS NOTE  Patient Care Team: Philemon Kingdom, MD as PCP - General (Internal Medicine) Thomasene Ripple, DO as PCP - Cardiology (Cardiology) Paulina Fusi Servando Snare, RN as Oncology Nurse Navigator (Oncology) Carver Fila, MD as Consulting Physician (Gynecologic Oncology)  ASSESSMENT & PLAN:  Ovarian cancer, left [facility]) She had multiple different nonspecific symptoms such as joint pain, abdominal pain and others She is not symptomatic from anemia Given her frail status, I recommend CT imaging study after today's dose of treatment She is in agreement   Pancytopenia, acquired [facility]) She tolerated treatment well Her anemia is stable We will monitor carefully and proceed with chemo without delay She does not need transfusion support  Cancer associated pain She will continue to take pain medicine as needed Patient appears reluctant to take pain medicine I encouraged her to take regular acetaminophen and intermittent doses of tramadol as needed   Orders Placed This Encounter  Procedures  . CT ABDOMEN PELVIS W CONTRAST    Standing Status:   Future    Standing Expiration Date:   10/08/2020    Order Specific Question:   If indicated for the ordered procedure, I authorize the administration of contrast media per Radiology protocol    Answer:   Yes    Order Specific Question:   Preferred imaging location?    Answer:   [facility]    Order Specific Question:   Radiology Contrast Protocol - do NOT remove file path    Answer:   [email or web address]    All questions were answered. The patient knows to call the clinic with any problems, questions or concerns. The total time spent in the appointment was 20 minutes encounter with patients including review of chart and various tests results, discussions about plan of care and coordination of care plan   Artis Delay, MD 10/09/2019 10:55 AM  INTERVAL HISTORY: Please  see below for problem oriented charting. She returns to be seen prior to dose #6 of treatment She has lost some weight She complains of intermittent abdominal pain, joint pain especially in the evening She usually take pain medicine before she sleeps at night The patient denies any recent signs or symptoms of bleeding such as spontaneous epistaxis, hematuria or hematochezia. 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     Ovarian cancer, left (HCC)  02/07/2019 Imaging   Outside CT chest showed no evidence of metastatic disease   03/20/2019 Imaging   Outside Ct abdomen and pelvis showed diffuse carcinomatosis, left adnexa mass and ascites   04/11/2019 Pathology Results   FINAL MICROSCOPIC DIAGNOSIS:   A. OMENTUM, LEFT ABDOMINAL, NEEDLE CORE BIOPSY:  - Metastatic carcinoma.  See comment    COMMENT:   Immunohistochemical stains show that the tumor cells are positive for PAX8, ER, WT1, CK7 (focal) and CK 5/6 (patchy); and negative for p63, calretinin, D2-40, CK20, GATA3 and CDX2.  This mmunohistochemical profile is consistent with a gynecologic primary and suggestive of a low-grade serous carcinoma.   04/11/2019 Procedure   Image guided core biopsy of the omental disease. In addition, small amount of ascites was collected for cytology   04/18/2019 Cancer Staging   Staging form: Ovary, Fallopian Tube, and  Primary Peritoneal Carcinoma, AJCC 8th Edition - Clinical stage from 04/18/2019: FIGO Stage IIIC (cT3c, cN0, cM0) - Signed by Heath Lark, MD on 04/18/2019   04/18/2019 Tumor Marker   Patient's tumor was tested for the following markers: CA-125 Results of the tumor marker test revealed 402    04/22/2019 - 06/24/2019 Chemotherapy   The patient had carboplatin and taxol x 4 for chemotherapy treatment.     05/13/2019 Tumor Marker   Patient's tumor was tested for the following markers: CA-125 Results of the tumor marker test revealed 146   06/12/2019 Imaging   2.6 cm left adnexal mass in this patient with known ovarian cancer, previously 3.3 cm.   Improving peritoneal disease/omental caking, as above.   No abdominopelvic ascites.   06/24/2019 Tumor Marker   Patient's tumor was tested for the following markers: CA-125 Results of the tumor marker test revealed 16.9   07/23/2019 Pathology Results   A. OMENTUM, EXCISION:  - Metastatic carcinoma to omentum, 29.6 cm, consistent with history of low-grade serous carcinoma   B. 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  On intra-abdominal entry, significant disease burden noted including large omental cake adherent to the anterior abdominal wall, extensive diaphragmatic disease (right greater than left), adhesions between the liver and the diaphragm, disease within the porta hepatis.  After conversion to exploratory laparotomy, additional disease was noted on the stomach and  significant disease burden on the sigmoid and rectum, both the colon and mesentery.  All of this disease was diffuse but small volume.  Additionally there were miliary implants along much of the abdominal peritoneum.  Uterus was 4-6 cm and normal in appearance.  Right fallopian tube and ovary atrophic and normal appearing, left ovary mildly enlarged and adherent to the sidewall with what appeared to be some tumor studding.  Small tumor implants were noted over much of the small bowel mesentery with 2 loops of small bowel adherent to the underside of the omental cake.  Appendix with tumor burden and adherent to the right sidewall.  Transverse colon significantly adherent to the omental cake which measured approximately 25 x 10 cm. Suboptimal resection due to burden of disease.  Given inability to resect, resection of tumor burden thought to be causing patient symptoms was performed   08/20/2019 Imaging   1. Interval progression of peritoneal disease. Although there is been and expected reduction in volume of omental cake status post debulking surgery there has been progressive peritoneal disease throughout the remaining portions of the abdomen and pelvis. 2. Increase in size of left adnexal mass. 3. Signs of serosal involvement of the distal small bowel, cecum and descending colon. There is also been progressive tumor infiltration throughout the sigmoid mesocolon within the pelvis. 4. Aortic atherosclerosis.     08/20/2019 Tumor Marker   Patient's tumor was tested for  the following markers: CA-125 Results of the tumor marker test revealed 52.3   08/21/2019 -  Chemotherapy   The patient had gemcitabine for chemotherapy treatment.      Genetic Testing   Patient has genetic testing done for MMR. Results revealed patient has the following: MMR - normal   09/18/2019 Tumor Marker   Patient's tumor was tested for the following markers: CA-125 Results of the tumor marker test revealed 45.     REVIEW  OF SYSTEMS:   Constitutional: Denies fevers, chills or abnormal weight loss Eyes: Denies blurriness of vision Ears, nose, mouth, throat, and face: Denies mucositis or sore throat Respiratory: Denies cough, dyspnea or wheezes Cardiovascular: Denies palpitation, chest discomfort or lower extremity swelling Gastrointestinal:  Denies nausea, heartburn or change in bowel habits Skin: Denies abnormal skin rashes Lymphatics: Denies new lymphadenopathy or easy bruising Neurological:Denies numbness, tingling or new weaknesses Behavioral/Psych: Mood is stable, no new changes  All other systems were reviewed with the patient and are negative.  I have reviewed the past medical history, past surgical history, social history and family history with the patient and they are unchanged from previous note.  ALLERGIES:  is allergic to codeine sulfate [codeine], tetanus toxoids, horse-derived products, and sulfa antibiotics.  MEDICATIONS:  Current Outpatient Medications  Medication Sig Dispense Refill  . acetaminophen (TYLENOL) 500 MG tablet Take 500-1,000 mg by mouth every 6 (six) hours as needed (for pain.).    Marland Kitchen docusate sodium (COLACE) 100 MG capsule Take 1 capsule (100 mg total) by mouth 2 (two) times daily. 30 capsule 2  . ELIQUIS 5 MG TABS tablet Take 5 mg by mouth 2 (two) times daily.    Marland Kitchen lidocaine-prilocaine (EMLA) cream Apply 1 application topically daily as needed. 30 g 3  . metoprolol succinate (TOPROL XL) 25 MG 24 hr tablet Take 1.5 tablets (37.5 mg total) by mouth daily. 135 tablet 1  . omeprazole (PRILOSEC) 20 MG capsule Take 20 mg by mouth 2 (two) times daily before a meal.     . ondansetron (ZOFRAN) 4 MG tablet Take 4 mg by mouth every 8 (eight) hours as needed for nausea or vomiting. PRN for nausea    . prochlorperazine (COMPAZINE) 10 MG tablet Take 10 mg by mouth every 6 (six) hours as needed for nausea or vomiting. PRN for nausea     No current facility-administered medications for this  visit.   Facility-Administered Medications Ordered in Other Visits  Medication Dose Route Frequency Provider Last Rate Last Admin  . heparin lock flush 100 unit/mL  500 Units Intracatheter Once PRN Alvy Bimler, Lemarcus Baggerly, MD      . sodium chloride flush (NS) 0.9 % injection 10 mL  10 mL Intracatheter PRN Alvy Bimler, Genova Kiner, MD        PHYSICAL EXAMINATION: ECOG PERFORMANCE STATUS: 1 - Symptomatic but completely ambulatory  Vitals:   10/09/19 0906  BP: 133/71  Pulse: 80  Resp: 16  Temp: 98.3 F (36.8 C)  SpO2: 100%   Filed Weights   10/09/19 0906  Weight: 120 lb 3.2 oz (54.5 kg)    GENERAL:alert, no distress and comfortable SKIN: skin color, texture, turgor are normal, no rashes or significant lesions EYES: normal, Conjunctiva are pink and non-injected, sclera clear OROPHARYNX:no exudate, no erythema and lips, buccal mucosa, and tongue normal  NECK: supple, thyroid normal size, non-tender, without nodularity LYMPH:  no palpable lymphadenopathy in the cervical, axillary or inguinal LUNGS: clear to auscultation and percussion with normal breathing effort HEART: regular rate &  rhythm and no murmurs and no lower extremity edema ABDOMEN:abdomen soft, non-tender and normal bowel sounds Musculoskeletal:no cyanosis of digits and no clubbing  NEURO: alert & oriented x 3 with fluent speech, no focal motor/sensory deficits  LABORATORY DATA:  I have reviewed the data as listed    Component Value Date/Time   NA 139 10/09/2019 0848   NA 137 03/04/2019 1107   K 4.2 10/09/2019 0848   CL 105 10/09/2019 0848   CO2 24 10/09/2019 0848   GLUCOSE 105 (H) 10/09/2019 0848   BUN 15 10/09/2019 0848   BUN 11 03/04/2019 1107   CREATININE 0.79 10/09/2019 0848   CALCIUM 10.5 (H) 10/09/2019 0848   PROT 7.9 10/09/2019 0848   PROT 7.4 03/04/2019 1107   ALBUMIN 3.2 (L) 10/09/2019 0848   ALBUMIN 3.6 03/04/2019 1107   AST 31 10/09/2019 0848   ALT 24 10/09/2019 0848   ALKPHOS 81 10/09/2019 0848   BILITOT <0.2 (L)  10/09/2019 0848   GFRNONAA >60 10/09/2019 0848   GFRAA >60 10/09/2019 0848    No results found for: SPEP, UPEP  Lab Results  Component Value Date   WBC 4.6 10/09/2019   NEUTROABS 2.2 10/09/2019   HGB 9.7 (L) 10/09/2019   HCT 29.8 (L) 10/09/2019   MCV 93.4 10/09/2019   PLT 384 10/09/2019      Chemistry      Component Value Date/Time   NA 139 10/09/2019 0848   NA 137 03/04/2019 1107   K 4.2 10/09/2019 0848   CL 105 10/09/2019 0848   CO2 24 10/09/2019 0848   BUN 15 10/09/2019 0848   BUN 11 03/04/2019 1107   CREATININE 0.79 10/09/2019 0848      Component Value Date/Time   CALCIUM 10.5 (H) 10/09/2019 0848   ALKPHOS 81 10/09/2019 0848   AST 31 10/09/2019 0848   ALT 24 10/09/2019 0848   BILITOT <0.2 (L) 10/09/2019 0848

## 2019-10-09 NOTE — Patient Instructions (Signed)
[place] Discharge Instructions for Patients Receiving Chemotherapy  Today you received the following chemotherapy agents: gemcitabine (gemzar).  To help prevent nausea and vomiting after your treatment, we encourage you to take your nausea medication as directed.   If you develop nausea and vomiting that is not controlled by your nausea medication, call the clinic.   BELOW ARE SYMPTOMS THAT SHOULD BE REPORTED IMMEDIATELY:  *FEVER GREATER THAN 100.5 F  *CHILLS WITH OR WITHOUT FEVER  NAUSEA AND VOMITING THAT IS NOT CONTROLLED WITH YOUR NAUSEA MEDICATION  *UNUSUAL SHORTNESS OF BREATH  *UNUSUAL BRUISING OR BLEEDING  TENDERNESS IN MOUTH AND THROAT WITH OR WITHOUT PRESENCE OF ULCERS  *URINARY PROBLEMS  *BOWEL PROBLEMS  UNUSUAL RASH Items with * indicate a potential emergency and should be followed up as soon as possible.  Feel free to call the clinic should you have any questions or concerns. The clinic phone number is [phone].  Please show the [place] at check-in to the Emergency Department and triage nurse.

## 2019-10-09 NOTE — Patient Instructions (Signed)

## 2019-10-10 ENCOUNTER — Encounter: Payer: Self-pay | Admitting: Hematology and Oncology

## 2019-10-10 ENCOUNTER — Telehealth: Payer: Self-pay

## 2019-10-10 NOTE — Telephone Encounter (Signed)
Called and spoke with Butch Penny. At this time Dr Alvy Bimler does not recommend a CT head and will re-evaluate at 9/14 appt. Butch Penny verbalized thanks and understanding.

## 2019-10-21 ENCOUNTER — Other Ambulatory Visit: Payer: Self-pay

## 2019-10-21 ENCOUNTER — Ambulatory Visit (HOSPITAL_COMMUNITY): Payer: Medicare HMO

## 2019-10-21 ENCOUNTER — Ambulatory Visit (HOSPITAL_COMMUNITY)
Admission: RE | Admit: 2019-10-21 | Discharge: 2019-10-21 | Disposition: A | Discharge status: Home / Self Care | Payer: Medicare HMO | Source: Ambulatory Visit | Attending: Hematology and Oncology | Admitting: Hematology and Oncology

## 2019-10-21 DIAGNOSIS — K7689 Other specified diseases of liver: Secondary | ICD-10-CM | POA: Diagnosis not present

## 2019-10-21 DIAGNOSIS — C562 Malignant neoplasm of left ovary: Secondary | ICD-10-CM | POA: Diagnosis not present

## 2019-10-21 DIAGNOSIS — I700 Atherosclerosis of aorta: Secondary | ICD-10-CM | POA: Diagnosis not present

## 2019-10-21 DIAGNOSIS — K8020 Calculus of gallbladder without cholecystitis without obstruction: Secondary | ICD-10-CM | POA: Diagnosis not present

## 2019-10-21 DIAGNOSIS — C800 Disseminated malignant neoplasm, unspecified: Secondary | ICD-10-CM | POA: Diagnosis not present

## 2019-10-21 MED ORDER — IOHEXOL 300 MG/ML  SOLN
100.0000 mL | Freq: Once | INTRAMUSCULAR | Status: AC | PRN
  Administered 2019-10-21: 100 mL via INTRAVENOUS

## 2019-10-21 MED ORDER — HEPARIN SOD (PORK) LOCK FLUSH 100 UNIT/ML IV SOLN
INTRAVENOUS | Status: AC
  Filled 2019-10-21: qty 5

## 2019-10-21 MED ORDER — HEPARIN SOD (PORK) LOCK FLUSH 100 UNIT/ML IV SOLN
500.0000 [IU] | Freq: Once | INTRAVENOUS | Status: AC
  Administered 2019-10-21: 500 [IU] via INTRAVENOUS

## 2019-10-22 ENCOUNTER — Other Ambulatory Visit: Payer: Self-pay

## 2019-10-22 ENCOUNTER — Inpatient Hospital Stay: Payer: Medicare HMO | Admitting: Hematology and Oncology

## 2019-10-22 ENCOUNTER — Encounter: Payer: Self-pay | Admitting: Hematology and Oncology

## 2019-10-22 ENCOUNTER — Inpatient Hospital Stay: Payer: Medicare HMO

## 2019-10-22 ENCOUNTER — Telehealth: Payer: Self-pay | Admitting: Hematology and Oncology

## 2019-10-22 DIAGNOSIS — G893 Neoplasm related pain (acute) (chronic): Secondary | ICD-10-CM

## 2019-10-22 DIAGNOSIS — C800 Disseminated malignant neoplasm, unspecified: Secondary | ICD-10-CM | POA: Diagnosis not present

## 2019-10-22 DIAGNOSIS — Z299 Encounter for prophylactic measures, unspecified: Secondary | ICD-10-CM

## 2019-10-22 DIAGNOSIS — Z7189 Other specified counseling: Secondary | ICD-10-CM | POA: Diagnosis not present

## 2019-10-22 DIAGNOSIS — R64 Cachexia: Secondary | ICD-10-CM

## 2019-10-22 DIAGNOSIS — C562 Malignant neoplasm of left ovary: Secondary | ICD-10-CM

## 2019-10-22 DIAGNOSIS — Z23 Encounter for immunization: Secondary | ICD-10-CM

## 2019-10-22 DIAGNOSIS — Z5111 Encounter for antineoplastic chemotherapy: Secondary | ICD-10-CM | POA: Diagnosis not present

## 2019-10-22 NOTE — Progress Notes (Signed)
[facility] OFFICE PROGRESS NOTE  Patient Care Team: Philemon Kingdom, MD as PCP - General (Internal Medicine) Thomasene Ripple, DO as PCP - Cardiology (Cardiology) Paulina Fusi Servando Snare, RN as Oncology Nurse Navigator (Oncology) Carver Fila, MD as Consulting Physician (Gynecologic Oncology)  ASSESSMENT & PLAN:  Ovarian cancer, left [facility]) I have reviewed multiple imaging studies Overall, she have stable disease control We discussed the risk and benefits of continuing treatment and she is in agreement I recommend minimum 3 more cycles of treatment before repeating imaging studies  Carcinomatosis [facility]) She has intermittent abdominal discomfort and early satiety due to presence of abdominal carcinomatosis I recommend frequent small meals and supportive care with pain management as needed  Cancer associated pain She will continue to take pain medicine as needed Patient appears reluctant to take pain medicine I encouraged her to take regular acetaminophen and intermittent doses of tramadol as needed  Malignant cachexia (HCC) She has significant disease burden around her stomach She has early satiety I recommend frequent small meals She can also take intermittent doses of steroid as needed to stimulate appetite  Preventive measure We discussed the importance of preventive care and reviewed the vaccination programs. She agrees to proceed with first dose of Covid-19 vaccination today and we will administer it today at the clinic.   Goals of care, counseling/discussion We have extensive discussions about goals of care She understood treatment goal is palliative   No orders of the defined types were placed in this encounter.   All questions were answered. The patient knows to call the clinic with any problems, questions or concerns. The total time spent in the appointment was 40 minutes encounter with patients including review of chart and various tests results,  discussions about plan of care and coordination of care plan   Artis Delay, MD 10/22/2019 1:41 PM  INTERVAL HISTORY: Please see below for problem oriented charting. 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     Ovarian cancer, left (HCC)  02/07/2019 Imaging   Outside CT chest showed no evidence of metastatic disease   03/20/2019 Imaging   Outside Ct abdomen and pelvis showed diffuse carcinomatosis, left adnexa mass and ascites   04/11/2019 Pathology Results   FINAL MICROSCOPIC DIAGNOSIS:   A. OMENTUM, LEFT ABDOMINAL, NEEDLE CORE BIOPSY:  - Metastatic carcinoma.  See comment    COMMENT:   Immunohistochemical stains show that the tumor cells are positive for PAX8, ER, WT1, CK7 (focal) and CK 5/6 (patchy); and negative for p63, calretinin, D2-40, CK20, GATA3 and CDX2.  This mmunohistochemical profile is consistent with a gynecologic primary and suggestive of a low-grade serous carcinoma.   04/11/2019 Procedure   Image guided core biopsy of the omental disease. In addition, small amount of ascites was collected for cytology   04/18/2019 Cancer Staging   Staging form: Ovary, Fallopian Tube, and Primary Peritoneal Carcinoma, AJCC 8th Edition - Clinical stage from 04/18/2019: FIGO Stage IIIC (cT3c, cN0, cM0) - Signed by Artis Delay, MD on 04/18/2019  04/18/2019 Tumor Marker   Patient's tumor was tested for the following markers: CA-125 Results of the tumor marker test  revealed 402   04/22/2019 - 06/24/2019 Chemotherapy   The patient had carboplatin and taxol x 4 for chemotherapy treatment.     05/13/2019 Tumor Marker   Patient's tumor was tested for the following markers: CA-125 Results of the tumor marker test revealed 146   06/12/2019 Imaging   2.6 cm left adnexal mass in this patient with known ovarian cancer, previously 3.3 cm.   Improving peritoneal disease/omental caking, as above.   No abdominopelvic ascites.   06/24/2019 Tumor Marker   Patient's tumor was tested for the following markers: CA-125 Results of the tumor marker test revealed 16.9   07/23/2019 Pathology Results   A. OMENTUM, EXCISION:  - Metastatic carcinoma to omentum, 29.6 cm, consistent with history of low-grade serous carcinoma   B. 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  On intra-abdominal entry, significant disease burden noted including large omental cake adherent to the anterior abdominal wall, extensive diaphragmatic disease (right greater than left), adhesions between the liver and the diaphragm, disease within the porta hepatis.  After conversion to exploratory laparotomy, additional disease was noted on the stomach  and significant disease burden on the sigmoid and rectum, both the colon and mesentery.  All of this disease was diffuse but small volume.  Additionally there were miliary implants along much of the abdominal peritoneum.  Uterus was 4-6 cm and normal in appearance.  Right fallopian tube and ovary atrophic and normal appearing, left ovary mildly enlarged and adherent to the sidewall with what appeared to be some tumor studding.  Small tumor implants were noted over much of the small bowel mesentery with 2 loops of small bowel adherent to the underside of the omental cake.  Appendix with tumor burden and adherent to the right sidewall.  Transverse colon significantly adherent to the omental cake which measured approximately 25 x 10 cm. Suboptimal resection due to burden of disease.  Given inability to resect, resection of tumor burden thought to be causing patient symptoms was performed   08/20/2019 Imaging   1. Interval progression of peritoneal disease. Although there is been and expected reduction in volume of omental cake status post debulking surgery there has been progressive peritoneal disease throughout the remaining portions of the abdomen and pelvis. 2. Increase in size of left adnexal mass. 3. Signs of serosal involvement of the distal small bowel, cecum and descending colon. There is also been progressive tumor infiltration throughout the sigmoid mesocolon within the pelvis. 4. Aortic atherosclerosis.     08/20/2019 Tumor Marker   Patient's tumor was tested for the following markers: CA-125 Results of the tumor marker test revealed 52.3   08/21/2019 -  Chemotherapy   The patient had gemcitabine for chemotherapy treatment.  Genetic Testing   Patient has genetic testing done for MMR. Results revealed patient has the following: MMR - normal   09/18/2019 Tumor Marker   Patient's tumor was tested for the following markers: CA-125 Results of the tumor marker test revealed 45.    10/22/2019 Imaging   1. Overall stable appearing extensive peritoneal carcinomatosis with thick omental caking, peritoneal implants and adenopathy. No new or significantly progressive findings. 2. Stable left adnexal mass.   Aortic Atherosclerosis (ICD10-I70.0).     REVIEW OF SYSTEMS:   Constitutional: Denies fevers, chills or abnormal weight loss Eyes: Denies blurriness of vision Ears, nose, mouth, throat, and face: Denies mucositis or sore throat Respiratory: Denies cough, dyspnea or wheezes Cardiovascular: Denies palpitation, chest discomfort or lower extremity swelling Gastrointestinal:  Denies nausea, heartburn or change in bowel habits Skin: Denies abnormal skin rashes Lymphatics: Denies new lymphadenopathy or easy bruising Neurological:Denies numbness, tingling or new weaknesses Behavioral/Psych: Mood is stable, no new changes  All other systems were reviewed with the patient and are negative.  I have reviewed the past medical history, past surgical history, social history and family history with the patient and they are unchanged from previous note.  ALLERGIES:  is allergic to codeine sulfate [codeine], tetanus toxoids, horse-derived products, and sulfa antibiotics.  MEDICATIONS:  Current Outpatient Medications  Medication Sig Dispense Refill  . cholecalciferol (VITAMIN D3) 25 MCG (1000 UNIT) tablet Take 2,000 Units by mouth 2 (two) times daily.    Marland Kitchen acetaminophen (TYLENOL) 500 MG tablet Take 500-1,000 mg by mouth every 6 (six) hours as needed (for pain.).    Marland Kitchen docusate sodium (COLACE) 100 MG capsule Take 1 capsule (100 mg total) by mouth 2 (two) times daily. 30 capsule 2  . ELIQUIS 5 MG TABS tablet Take 5 mg by mouth 2 (two) times daily.    Marland Kitchen lidocaine-prilocaine (EMLA) cream Apply 1 application topically daily as needed. 30 g 3  . metoprolol succinate (TOPROL XL) 25 MG 24 hr tablet Take 1.5 tablets (37.5 mg total) by mouth daily. 135 tablet 1  . omeprazole (PRILOSEC) 20  MG capsule Take 20 mg by mouth 2 (two) times daily before a meal.     . ondansetron (ZOFRAN) 4 MG tablet Take 4 mg by mouth every 8 (eight) hours as needed for nausea or vomiting. PRN for nausea    . prochlorperazine (COMPAZINE) 10 MG tablet Take 10 mg by mouth every 6 (six) hours as needed for nausea or vomiting. PRN for nausea     No current facility-administered medications for this visit.    PHYSICAL EXAMINATION: ECOG PERFORMANCE STATUS: 1 - Symptomatic but completely ambulatory  Vitals:   10/22/19 1310  BP: 124/62  Pulse: 93  Resp: 18  Temp: 98.6 F (37 C)  SpO2: 100%   Filed Weights   10/22/19 1310  Weight: 122 lb 6.4 oz (55.5 kg)    GENERAL:alert, no distress and comfortable NEURO: alert & oriented x 3 with fluent speech, no focal motor/sensory deficits  LABORATORY DATA:  I have reviewed the data as listed    Component Value Date/Time   NA 139 10/09/2019 0848   NA 137 03/04/2019 1107   K 4.2 10/09/2019 0848   CL 105 10/09/2019 0848   CO2 24 10/09/2019 0848   GLUCOSE 105 (H) 10/09/2019 0848   BUN 15 10/09/2019 0848   BUN 11 03/04/2019 1107   CREATININE 0.79 10/09/2019 0848   CALCIUM 10.5 (H) 10/09/2019 0848   PROT 7.9 10/09/2019 0848  PROT 7.4 03/04/2019 1107   ALBUMIN 3.2 (L) 10/09/2019 0848   ALBUMIN 3.6 03/04/2019 1107   AST 31 10/09/2019 0848   ALT 24 10/09/2019 0848   ALKPHOS 81 10/09/2019 0848   BILITOT <0.2 (L) 10/09/2019 0848   GFRNONAA >60 10/09/2019 0848   GFRAA >60 10/09/2019 0848    No results found for: SPEP, UPEP  Lab Results  Component Value Date   WBC 4.6 10/09/2019   NEUTROABS 2.2 10/09/2019   HGB 9.7 (L) 10/09/2019   HCT 29.8 (L) 10/09/2019   MCV 93.4 10/09/2019   PLT 384 10/09/2019      Chemistry      Component Value Date/Time   NA 139 10/09/2019 0848   NA 137 03/04/2019 1107   K 4.2 10/09/2019 0848   CL 105 10/09/2019 0848   CO2 24 10/09/2019 0848   BUN 15 10/09/2019 0848   BUN 11 03/04/2019 1107   CREATININE 0.79  10/09/2019 0848      Component Value Date/Time   CALCIUM 10.5 (H) 10/09/2019 0848   ALKPHOS 81 10/09/2019 0848   AST 31 10/09/2019 0848   ALT 24 10/09/2019 0848   BILITOT <0.2 (L) 10/09/2019 0848       RADIOGRAPHIC STUDIES: I have reviewed multiple imaging studies with the patient I have personally reviewed the radiological images as listed and agreed with the findings in the report. CT ABDOMEN PELVIS W CONTRAST  Result Date: 10/22/2019 CLINICAL DATA:  Restaging ovarian cancer. EXAM: CT ABDOMEN AND PELVIS WITH CONTRAST TECHNIQUE: Multidetector CT imaging of the abdomen and pelvis was performed using the standard protocol following bolus administration of intravenous contrast. CONTRAST:  116mL OMNIPAQUE IOHEXOL 300 MG/ML  SOLN COMPARISON:  CT scan 08/20/2019 FINDINGS: Lower chest: Stable mild eventration of both hemidiaphragms. No pulmonary lesions, pleural effusions or pleural lesions. The heart is normal in size. No pericardial effusion. Stable aortic calcifications. Hepatobiliary: There are the extensive peritoneal surface disease involving the liver appears overall stable. I do not see any definite new or progressive findings. No intrahepatic metastatic disease. Stable small gallstones. No common bile duct dilatation. Pancreas: No mass, inflammation or ductal dilatation. Spleen: Normal size. No focal lesions. Adrenals/Urinary Tract: The adrenal glands and kidneys are unremarkable and stable. The bladder is unremarkable. Stomach/Bowel: The stomach, duodenum, small bowel and colon are grossly normal. No acute inflammatory changes or obstructive findings. Stable appearing areas of peritoneal surface disease involving the colon. No new or progressive findings. Vascular/Lymphatic: Stable atherosclerotic calcifications involving the aorta and iliac arteries but no aneurysm or dissection. Stable scattered mesenteric and retroperitoneal lymph nodes. Overall stable appearing extensive peritoneal  carcinomatosis with thick omental caking. Disease between the left hepatic lobe and the stomach on image 28/2 measures 5.7 x 1.9 cm and previously measured 4.3 x 1.8 cm. Lesion adjacent to the splenic flexure area the colon on image 31/2 measures 4.3 x 2.2 cm and previously measured 4.4 x 2.4 cm. Elongated omental lesion adjacent to the splenic flexure region of the colon on image 25/2 measures 5.4 x 2.1 cm and previously measured 5.7 x 2.0 cm. Mesenteric implant versus is periportal lymph node on image 24/2 measures a maximum 2.1 cm and previously measured 2.0 cm. The left adnexal mass measures approximately 5.1 x 4.1 cm and previously measured 4.4 x 4.2 cm. Reproductive: The uterus and right ovary appear normal and stable. Other: No abdominal/pelvic ascites. Musculoskeletal: No significant bony findings. IMPRESSION: 1. Overall stable appearing extensive peritoneal carcinomatosis with thick omental caking, peritoneal implants  and adenopathy. No new or significantly progressive findings. 2. Stable left adnexal mass. Aortic Atherosclerosis (ICD10-I70.0). Electronically Signed   By: Marijo Sanes M.D.   On: 10/22/2019 10:33   IR IMAGING GUIDED PORT INSERTION  Result Date: 10/01/2019 INDICATION: 85 year old female with left-sided ovarian cancer in need of durable venous access for chemotherapy. EXAM: IMPLANTED PORT A CATH PLACEMENT WITH ULTRASOUND AND FLUOROSCOPIC GUIDANCE MEDICATIONS: 2 g Ancef; The antibiotic was administered within an appropriate time interval prior to skin puncture. ANESTHESIA/SEDATION: Versed 2 mg IV; Fentanyl 100 mcg IV; Moderate Sedation Time:  21 minutes The patient was continuously monitored during the procedure by the interventional radiology nurse under my direct supervision. FLUOROSCOPY TIME:  One minutes, 6 seconds (4 mGy) COMPLICATIONS: None immediate. PROCEDURE: The right neck and chest was prepped with chlorhexidine, and draped in the usual sterile fashion using maximum barrier  technique (cap and mask, sterile gown, sterile gloves, large sterile sheet, hand hygiene and cutaneous antiseptic). Local anesthesia was attained by infiltration with 1% lidocaine with epinephrine. Ultrasound demonstrated patency of the right internal jugular vein, and this was documented with an image. Under real-time ultrasound guidance, this vein was accessed with a 21 gauge micropuncture needle and image documentation was performed. A small dermatotomy was made at the access site with an 11 scalpel. A 0.018" wire was advanced into the SVC and the access needle exchanged for a 41F micropuncture vascular sheath. The 0.018" wire was then removed and a 0.035" wire advanced into the IVC. An appropriate location for the subcutaneous reservoir was selected below the clavicle and an incision was made through the skin and underlying soft tissues. The subcutaneous tissues were then dissected using a combination of blunt and sharp surgical technique and a pocket was formed. A single lumen low-profile power injectable portacatheter was then tunneled through the subcutaneous tissues from the pocket to the dermatotomy and the port reservoir placed within the subcutaneous pocket. The venous access site was then serially dilated and a peel away vascular sheath placed over the wire. The wire was removed and the port catheter advanced into position under fluoroscopic guidance. The catheter tip is positioned in the superior cavoatrial junction. This was documented with a spot image. The portacatheter was then tested and found to flush and aspirate well. The port was flushed with saline followed by 100 units/mL heparinized saline. The pocket was then closed in two layers using first subdermal inverted interrupted absorbable sutures followed by a running subcuticular suture. The epidermis was then sealed with Dermabond. The dermatotomy at the venous access site was also closed with Dermabond. IMPRESSION: Successful placement of a  right IJ approach Power Port with ultrasound and fluoroscopic guidance. The catheter is ready for use. Electronically Signed   By: Jacqulynn Cadet M.D.   On: 10/01/2019 17:44

## 2019-10-22 NOTE — Assessment & Plan Note (Signed)
She will continue to take pain medicine as needed Patient appears reluctant to take pain medicine I encouraged her to take regular acetaminophen and intermittent doses of tramadol as needed

## 2019-10-22 NOTE — Assessment & Plan Note (Signed)
We discussed the importance of preventive care and reviewed the vaccination programs. She agrees to proceed with first dose of Covid-19 vaccination today and we will administer it today at the clinic.

## 2019-10-22 NOTE — Telephone Encounter (Signed)
Scheduled appts per 9/14 sch msg. Gave pt a print out of AVS.

## 2019-10-22 NOTE — Assessment & Plan Note (Signed)
She has intermittent abdominal discomfort and early satiety due to presence of abdominal carcinomatosis I recommend frequent small meals and supportive care with pain management as needed

## 2019-10-22 NOTE — Assessment & Plan Note (Signed)
She has significant disease burden around her stomach She has early satiety I recommend frequent small meals She can also take intermittent doses of steroid as needed to stimulate appetite

## 2019-10-22 NOTE — Assessment & Plan Note (Signed)
We have extensive discussions about goals of care She understood treatment goal is palliative 

## 2019-10-22 NOTE — Assessment & Plan Note (Signed)
I have reviewed multiple imaging studies Overall, she have stable disease control We discussed the risk and benefits of continuing treatment and she is in agreement I recommend minimum 3 more cycles of treatment before repeating imaging studies

## 2019-10-23 ENCOUNTER — Other Ambulatory Visit: Payer: Self-pay

## 2019-10-23 ENCOUNTER — Inpatient Hospital Stay: Payer: Medicare HMO

## 2019-10-23 ENCOUNTER — Ambulatory Visit: Payer: Medicare HMO | Admitting: Cardiology

## 2019-10-23 VITALS — BP 119/61 | HR 80 | Temp 97.9°F | Resp 18 | Wt 122.0 lb

## 2019-10-23 DIAGNOSIS — C800 Disseminated malignant neoplasm, unspecified: Secondary | ICD-10-CM

## 2019-10-23 DIAGNOSIS — Z5111 Encounter for antineoplastic chemotherapy: Secondary | ICD-10-CM | POA: Diagnosis not present

## 2019-10-23 DIAGNOSIS — C562 Malignant neoplasm of left ovary: Secondary | ICD-10-CM

## 2019-10-23 DIAGNOSIS — R971 Elevated cancer antigen 125 [CA 125]: Secondary | ICD-10-CM

## 2019-10-23 DIAGNOSIS — Z7189 Other specified counseling: Secondary | ICD-10-CM

## 2019-10-23 DIAGNOSIS — E860 Dehydration: Secondary | ICD-10-CM

## 2019-10-23 LAB — CMP (CANCER CENTER ONLY)
ALT: 12 U/L (ref 0–44)
AST: 22 U/L (ref 15–41)
Albumin: 3 g/dL — ABNORMAL LOW (ref 3.5–5.0)
Alkaline Phosphatase: 84 U/L (ref 38–126)
Anion gap: 9 (ref 5–15)
BUN: 16 mg/dL (ref 8–23)
CO2: 24 mmol/L (ref 22–32)
Calcium: 9 mg/dL (ref 8.9–10.3)
Chloride: 104 mmol/L (ref 98–111)
Creatinine: 0.89 mg/dL (ref 0.44–1.00)
GFR, Est AFR Am: >60 mL/min (ref >60)
GFR, Estimated: 60 mL/min — ABNORMAL LOW (ref >60)
Glucose, Bld: 123 mg/dL — ABNORMAL HIGH (ref 70–99)
Potassium: 3.8 mmol/L (ref 3.5–5.1)
Sodium: 137 mmol/L (ref 135–145)
Total Bilirubin: 0.2 mg/dL — ABNORMAL LOW (ref 0.3–1.2)
Total Protein: 7.6 g/dL (ref 6.5–8.1)

## 2019-10-23 LAB — CBC WITH DIFFERENTIAL (CANCER CENTER ONLY)
Abs Immature Granulocytes: 0.03 10*3/uL (ref 0.00–0.07)
Basophils Absolute: 0 10*3/uL (ref 0.0–0.1)
Basophils Relative: 1 %
Eosinophils Absolute: 0.1 10*3/uL (ref 0.0–0.5)
Eosinophils Relative: 1 %
HCT: 28.5 % — ABNORMAL LOW (ref 36.0–46.0)
Hemoglobin: 9.2 g/dL — ABNORMAL LOW (ref 12.0–15.0)
Immature Granulocytes: 0 %
Lymphocytes Relative: 14 %
Lymphs Abs: 1 10*3/uL (ref 0.7–4.0)
MCH: 30.5 pg (ref 26.0–34.0)
MCHC: 32.3 g/dL (ref 30.0–36.0)
MCV: 94.4 fL (ref 80.0–100.0)
Monocytes Absolute: 1 10*3/uL (ref 0.1–1.0)
Monocytes Relative: 14 %
Neutro Abs: 5 10*3/uL (ref 1.7–7.7)
Neutrophils Relative %: 70 %
Platelet Count: 359 10*3/uL (ref 150–400)
RBC: 3.02 MIL/uL — ABNORMAL LOW (ref 3.87–5.11)
RDW: 16.5 % — ABNORMAL HIGH (ref 11.5–15.5)
WBC Count: 7.3 10*3/uL (ref 4.0–10.5)
nRBC: 0 % (ref 0.0–0.2)

## 2019-10-23 MED ORDER — PROCHLORPERAZINE MALEATE 10 MG PO TABS
5.0000 mg | ORAL_TABLET | Freq: Once | ORAL | Status: AC
  Administered 2019-10-23: 5 mg via ORAL

## 2019-10-23 MED ORDER — PROCHLORPERAZINE MALEATE 10 MG PO TABS
ORAL_TABLET | ORAL | Status: AC
  Filled 2019-10-23: qty 1

## 2019-10-23 MED ORDER — SODIUM CHLORIDE 0.9 % IV SOLN
Freq: Once | INTRAVENOUS | Status: AC
  Filled 2019-10-23: qty 250

## 2019-10-23 MED ORDER — SODIUM CHLORIDE 0.9% FLUSH
10.0000 mL | INTRAVENOUS | Status: DC | PRN
  Administered 2019-10-23: 10 mL
  Filled 2019-10-23: qty 10

## 2019-10-23 MED ORDER — SODIUM CHLORIDE 0.9% FLUSH
10.0000 mL | Freq: Once | INTRAVENOUS | Status: AC | PRN
  Administered 2019-10-23: 10 mL
  Filled 2019-10-23: qty 10

## 2019-10-23 MED ORDER — SODIUM CHLORIDE 0.9 % IV SOLN
800.0000 mg/m2 | Freq: Once | INTRAVENOUS | Status: AC
  Administered 2019-10-23: 1216 mg via INTRAVENOUS
  Filled 2019-10-23: qty 31.98

## 2019-10-23 MED ORDER — HEPARIN SOD (PORK) LOCK FLUSH 100 UNIT/ML IV SOLN
500.0000 [IU] | Freq: Once | INTRAVENOUS | Status: AC | PRN
  Administered 2019-10-23: 500 [IU]
  Filled 2019-10-23: qty 5

## 2019-10-23 NOTE — Patient Instructions (Signed)
[facility] [facility] Discharge Instructions for Patients Receiving Chemotherapy  Today you received the following chemotherapy agents: gemcitabine (gemzar).  To help prevent nausea and vomiting after your treatment, we encourage you to take your nausea medication as directed.   If you develop nausea and vomiting that is not controlled by your nausea medication, call the clinic.   BELOW ARE SYMPTOMS THAT SHOULD BE REPORTED IMMEDIATELY:  *FEVER GREATER THAN 100.5 F  *CHILLS WITH OR WITHOUT FEVER  NAUSEA AND VOMITING THAT IS NOT CONTROLLED WITH YOUR NAUSEA MEDICATION  *UNUSUAL SHORTNESS OF BREATH  *UNUSUAL BRUISING OR BLEEDING  TENDERNESS IN MOUTH AND THROAT WITH OR WITHOUT PRESENCE OF ULCERS  *URINARY PROBLEMS  *BOWEL PROBLEMS  UNUSUAL RASH Items with * indicate a potential emergency and should be followed up as soon as possible.  Feel free to call the clinic should you have any questions or concerns. The clinic phone number is [phone].  Please show the [place] at check-in to the Emergency Department and triage nurse.

## 2019-10-24 LAB — CA 125: Cancer Antigen (CA) 125: 38.4 U/mL — ABNORMAL HIGH (ref 0.0–38.1)

## 2019-10-24 NOTE — Telephone Encounter (Signed)
Hi Butch Penny, thank you very much for the update. It will be ok if someone brings her to the visit. Please let her know that I have been thinking of her recently as well.

## 2019-10-28 ENCOUNTER — Other Ambulatory Visit (HOSPITAL_COMMUNITY): Payer: Medicare HMO

## 2019-10-29 ENCOUNTER — Ambulatory Visit: Payer: Medicare HMO | Admitting: Cardiology

## 2019-10-29 ENCOUNTER — Other Ambulatory Visit: Payer: Self-pay

## 2019-10-29 ENCOUNTER — Encounter: Payer: Self-pay | Admitting: Cardiology

## 2019-10-29 VITALS — BP 120/70 | HR 84 | Ht 62.0 in | Wt 119.0 lb

## 2019-10-29 DIAGNOSIS — I480 Paroxysmal atrial fibrillation: Secondary | ICD-10-CM | POA: Diagnosis not present

## 2019-10-29 DIAGNOSIS — I491 Atrial premature depolarization: Secondary | ICD-10-CM | POA: Diagnosis not present

## 2019-10-29 DIAGNOSIS — I10 Essential (primary) hypertension: Secondary | ICD-10-CM

## 2019-10-29 NOTE — Patient Instructions (Signed)
Medication Instructions:  Your physician recommends that you continue on your current medications as directed. Please refer to the Current Medication list given to you today.  *If you need a refill on your cardiac medications before your next appointment, please call your pharmacy*   Lab Work: None. If you have labs (blood work) drawn today and your tests are completely normal, you will receive your results only by: . MyChart Message (if you have MyChart) OR . A paper copy in the mail If you have any lab test that is abnormal or we need to change your treatment, we will call you to review the results.   Testing/Procedures: none   Follow-Up: At [facility], you and your health needs are our priority.  As part of our continuing mission to provide you with exceptional heart care, we have created designated Provider Care Teams.  These Care Teams include your primary Cardiologist (physician) and Advanced Practice Providers (APPs -  Physician Assistants and Nurse Practitioners) who all work together to provide you with the care you need, when you need it.  We recommend signing up for the patient portal called "MyChart".  Sign up information is provided on this After Visit Summary.  MyChart is used to connect with patients for Virtual Visits (Telemedicine).  Patients are able to view lab/test results, encounter notes, upcoming appointments, etc.  Non-urgent messages can be sent to your provider as well.   To learn more about what you can do with MyChart, go to [email or web address].    Your next appointment:   6 month(s)  The format for your next appointment:   In Person  Provider:   Kardie Tobb, DO   Other Instructions    

## 2019-10-29 NOTE — Progress Notes (Signed)
Cardiology Office Note:    Date:  10/29/2019   ID:  Whitney Floyd, DOB 1934-05-08, MRN [id number]  PCP:  Whitney Kiel, MD  Cardiologist:  Whitney Salines, DO  Electrophysiologist:  None   Referring MD: Whitney Kiel, MD   Chief Complaint  Patient presents with  . Follow-up    History of Present Illness:    Whitney Floyd is a 85 y.o. female with a hx of paroxysmal atrial fibrillation (on Eliquis-5 twice aday, metoprolol succinate 25 mg daily and Cardizem 20 mg daily, steroid-induced hyperglycemia, peripheral neuropathy due to chemotherapy, ovarian cancer, status post chemotherapy pending debulking surgery on July 23, 2019.  She is here today for follow-up visit.  She tells me that she has been doing chemotherapy and she has been tolerating this therapy.  Almost that she is significantly fatigued but she is able to do some basic activities at home.  They are planning on doing a CT scan after 5 more chemo treatments.  Past Medical History:  Diagnosis Date  . Anemia   . Aortic atherosclerosis ([place])   . Arthritis   . Atrial fibrillation ([place])   . 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 Cataract   . Diverticulosis   . Dysrhythmia    a fib  . GERD (gastroesophageal reflux disease)   . Headache    hx of migraines  . Hyperlipidemia   . Osteopenia   . Paroxysmal A-fib ([place]) 02/08/2019  . Peptic ulcer disease     Past Surgical History:  Procedure Laterality Date  . benign bone tumor removal    . CATARACT EXTRACTION, BILATERAL    . COLONOSCOPY    . IR IMAGING GUIDED PORT INSERTION  10/01/2019  . TUBAL LIGATION      Current Medications: Current Meds  Medication Sig  . acetaminophen (TYLENOL) 500 MG tablet Take 500-1,000 mg by mouth every 6 (six) hours as needed (for pain.).  Marland Kitchen cholecalciferol (VITAMIN D3) 25 MCG (1000 UNIT) tablet Take 2,000 Units by mouth 2 (two) times daily.  Marland Kitchen ELIQUIS 5 MG TABS tablet  Take 5 mg by mouth 2 (two) times daily.  . metoprolol succinate (TOPROL XL) 25 MG 24 hr tablet Take 1.5 tablets (37.5 mg total) by mouth daily.  Marland Kitchen omeprazole (PRILOSEC) 20 MG capsule Take 20 mg by mouth 2 (two) times daily before a meal.   . ondansetron (ZOFRAN) 4 MG tablet Take 4 mg by mouth every 8 (eight) hours as needed for nausea or vomiting. PRN for nausea  . prochlorperazine (COMPAZINE) 10 MG tablet Take 10 mg by mouth every 6 (six) hours as needed for nausea or vomiting. PRN for nausea     Allergies:   Codeine sulfate [codeine], Tetanus toxoids, Horse-derived products, and Sulfa antibiotics   Social History   Socioeconomic History  . Marital status: Divorced    Spouse name: Not on file  . Number of children: 6  . Years of education: Not on file  . Highest education level: Not on file  Occupational History  . Occupation: retired Sales executive  Tobacco Use  . Smoking status: Never Smoker  . Smokeless tobacco: Never Used  Vaping Use  . Vaping Use: Never used  Substance and Sexual Activity  . Alcohol use: Never  . Drug use: Never  . Sexual activity: Not Currently  Other Topics Concern  . Not on file  Social History Narrative   Lived with  son, Whitney Floyd   Social Determinants of Health   Financial Resource Strain:   . Difficulty of Paying Living Expenses: Not on file  Food Insecurity:   . Worried About Charity fundraiser in the Last Year: Not on file  . Ran Out of Food in the Last Year: Not on file  Transportation Needs:   . Lack of Transportation (Medical): Not on file  . Lack of Transportation (Non-Medical): Not on file  Physical Activity:   . Days of Exercise per Week: Not on file  . Minutes of Exercise per Session: Not on file  Stress:   . Feeling of Stress : Not on file  Social Connections:   . Frequency of Communication with Friends and Family: Not on file  . Frequency of Social Gatherings with Friends and Family: Not on file  . Attends Religious Services: Not  on file  . Active Member of Clubs or Organizations: Not on file  . Attends Archivist Meetings: Not on file  . Marital Status: Not on file     Family History: The patient's family history includes Arrhythmia in her father; Breast cancer in her paternal aunt; Dementia in her mother; Diabetes in her mother; Other in her mother; Ovarian cancer in her paternal grandmother; Stroke in her father. There is no history of Colon cancer, Uterine cancer, or Prostate cancer.  ROS:   Review of Systems  Constitution: Negative for decreased appetite, fever and weight gain.  HENT: Negative for congestion, ear discharge, hoarse voice and sore throat.   Eyes: Negative for discharge, redness, vision loss in right eye and visual halos.  Cardiovascular: Negative for chest pain, dyspnea on exertion, leg swelling, orthopnea and palpitations.  Respiratory: Negative for cough, hemoptysis, shortness of breath and snoring.   Endocrine: Negative for heat intolerance and polyphagia.  Hematologic/Lymphatic: Negative for bleeding problem. Does not bruise/bleed easily.  Skin: Negative for flushing, nail changes, rash and suspicious lesions.  Musculoskeletal: Negative for arthritis, joint pain, muscle cramps, myalgias, neck pain and stiffness.  Gastrointestinal: Negative for abdominal pain, bowel incontinence, diarrhea and excessive appetite.  Genitourinary: Negative for decreased libido, genital sores and incomplete emptying.  Neurological: Negative for brief paralysis, focal weakness, headaches and loss of balance.  Psychiatric/Behavioral: Negative for altered mental status, depression and suicidal ideas.  Allergic/Immunologic: Negative for HIV exposure and persistent infections.    EKGs/Labs/Other Studies Reviewed:    The following studies were reviewed today:   EKG: None today  Recent Labs: 03/04/2019: Magnesium 1.8; TSH 2.580 10/23/2019: ALT 12; BUN 16; Creatinine 0.89; Hemoglobin 9.2; Platelet Count  359; Potassium 3.8; Sodium 137  Recent Lipid Panel No results found for: CHOL, TRIG, HDL, CHOLHDL, VLDL, LDLCALC, LDLDIRECT  Physical Exam:    VS:  BP 120/70 (BP Location: Left Arm, Patient Position: Sitting, Cuff Size: Normal)   Pulse 84   Ht 5\' 2"  (1.575 m)   Wt 119 lb (54 kg)   SpO2 98%   BMI 21.77 kg/m     Wt Readings from Last 3 Encounters:  10/29/19 119 lb (54 kg)  10/23/19 122 lb (55.3 kg)  10/22/19 122 lb 6.4 oz (55.5 kg)     GEN: Well nourished, well developed in no acute distress HEENT: Normal NECK: No JVD; No carotid bruits LYMPHATICS: No lymphadenopathy CARDIAC: S1S2 noted,RRR, no murmurs, rubs, gallops RESPIRATORY:  Clear to auscultation without rales, wheezing or rhonchi  ABDOMEN: Soft, non-tender, non-distended, +bowel sounds, no guarding. EXTREMITIES: No edema, No cyanosis, no clubbing MUSCULOSKELETAL:  No deformity  SKIN: Warm and dry NEUROLOGIC:  Alert and oriented x 3, non-focal PSYCHIATRIC:  Normal affect, good insight  ASSESSMENT:    1. Paroxysmal atrial fibrillation (HCC)   2. Essential hypertension    PLAN:      We will continue patient on her current Eliquis 5 blocker twice a day along with Toprol-XL 37.5 mg daily.  Her blood pressure is acceptable.  We will continue to monitor.  I will have to coordinate with the patient colleges to make sure after this round of chemo for the need of echocardiogram.  The patient is in agreement with the above plan. The patient left the office in stable condition.  The patient will follow up in 6 months or sooner if needed.   Medication Adjustments/Labs and Tests Ordered: Current medicines are reviewed at length with the patient today.  Concerns regarding medicines are outlined above.  No orders of the defined types were placed in this encounter.  No orders of the defined types were placed in this encounter.   Patient Instructions  Medication Instructions:  Your physician recommends that you continue on  your current medications as directed. Please refer to the Current Medication list given to you today.  *If you need a refill on your cardiac medications before your next appointment, please call your pharmacy*   Lab Work: None. If you have labs (blood work) drawn today and your tests are completely normal, you will receive your results only by: Marland Kitchen MyChart Message (if you have MyChart) OR . A paper copy in the mail If you have any lab test that is abnormal or we need to change your treatment, we will call you to review the results.   Testing/Procedures: none   Follow-Up: At [facility], you and your health needs are our priority.  As part of our continuing mission to provide you with exceptional heart care, we have created designated Provider Care Teams.  These Care Teams include your primary Cardiologist (physician) and Advanced Practice Providers (APPs -  Physician Assistants and Nurse Practitioners) who all work together to provide you with the care you need, when you need it.  We recommend signing up for the patient portal called "MyChart".  Sign up information is provided on this After Visit Summary.  MyChart is used to connect with patients for Virtual Visits (Telemedicine).  Patients are able to view lab/test results, encounter notes, upcoming appointments, etc.  Non-urgent messages can be sent to your provider as well.   To learn more about what you can do with MyChart, go to [email or web address].    Your next appointment:   6 month(s)  The format for your next appointment:   In Person  Provider:   Berniece Salines, DO   Other Instructions      Adopting a Healthy Lifestyle.  Know what a healthy weight is for you (roughly BMI <25) and aim to maintain this   Aim for 7+ servings of fruits and vegetables daily   65-80+ fluid ounces of water or unsweet tea for healthy kidneys   Limit to max 1 drink of alcohol per day; avoid smoking/tobacco   Limit animal fats in diet  for cholesterol and heart health - choose grass fed whenever available   Avoid highly processed foods, and foods high in saturated/trans fats   Aim for low stress - take time to unwind and care for your mental health   Aim for 150 min of moderate intensity exercise weekly for heart health, and  weights twice weekly for bone health   Aim for 7-9 hours of sleep daily   When it comes to diets, agreement about the perfect plan isnt easy to find, even among the experts. Experts at the [place] developed an idea known as the Healthy Eating Plate. Just imagine a plate divided into logical, healthy portions.   The emphasis is on diet quality:   Load up on vegetables and fruits - one-half of your plate: Aim for color and variety, and remember that potatoes dont count.   Go for whole grains - one-quarter of your plate: Whole wheat, barley, wheat berries, quinoa, oats, brown rice, and foods made with them. If you want pasta, go with whole wheat pasta.   Protein power - one-quarter of your plate: Fish, chicken, beans, and nuts are all healthy, versatile protein sources. Limit red meat.   The diet, however, does go beyond the plate, offering a few other suggestions.   Use healthy plant oils, such as olive, canola, soy, corn, sunflower and peanut. Check the labels, and avoid partially hydrogenated oil, which have unhealthy trans fats.   If youre thirsty, drink water. Coffee and tea are good in moderation, but skip sugary drinks and limit milk and dairy products to one or two daily servings.   The type of carbohydrate in the diet is more important than the amount. Some sources of carbohydrates, such as vegetables, fruits, whole grains, and beans-are healthier than others.   Finally, stay active  Signed, Whitney Salines, DO  10/29/2019 11:02 AM    [place]

## 2019-10-30 ENCOUNTER — Inpatient Hospital Stay: Payer: Medicare HMO

## 2019-10-30 ENCOUNTER — Ambulatory Visit: Payer: Medicare HMO | Admitting: Hematology and Oncology

## 2019-10-30 ENCOUNTER — Other Ambulatory Visit: Payer: Self-pay

## 2019-10-30 VITALS — BP 133/65 | HR 88 | Temp 98.0°F | Resp 16

## 2019-10-30 DIAGNOSIS — C800 Disseminated malignant neoplasm, unspecified: Secondary | ICD-10-CM

## 2019-10-30 DIAGNOSIS — Z7189 Other specified counseling: Secondary | ICD-10-CM

## 2019-10-30 DIAGNOSIS — R971 Elevated cancer antigen 125 [CA 125]: Secondary | ICD-10-CM

## 2019-10-30 DIAGNOSIS — C562 Malignant neoplasm of left ovary: Secondary | ICD-10-CM

## 2019-10-30 DIAGNOSIS — Z5111 Encounter for antineoplastic chemotherapy: Secondary | ICD-10-CM | POA: Diagnosis not present

## 2019-10-30 LAB — CMP (CANCER CENTER ONLY)
ALT: 23 U/L (ref 0–44)
AST: 37 U/L (ref 15–41)
Albumin: 3.1 g/dL — ABNORMAL LOW (ref 3.5–5.0)
Alkaline Phosphatase: 89 U/L (ref 38–126)
Anion gap: 5 (ref 5–15)
BUN: 20 mg/dL (ref 8–23)
CO2: 28 mmol/L (ref 22–32)
Calcium: 9.5 mg/dL (ref 8.9–10.3)
Chloride: 105 mmol/L (ref 98–111)
Creatinine: 0.81 mg/dL (ref 0.44–1.00)
GFR, Est AFR Am: >60 mL/min (ref >60)
GFR, Estimated: >60 mL/min (ref >60)
Glucose, Bld: 105 mg/dL — ABNORMAL HIGH (ref 70–99)
Potassium: 4.2 mmol/L (ref 3.5–5.1)
Sodium: 138 mmol/L (ref 135–145)
Total Bilirubin: <0.2 mg/dL — ABNORMAL LOW (ref 0.3–1.2)
Total Protein: 8 g/dL (ref 6.5–8.1)

## 2019-10-30 LAB — CBC WITH DIFFERENTIAL (CANCER CENTER ONLY)
Abs Immature Granulocytes: 0.02 10*3/uL (ref 0.00–0.07)
Basophils Absolute: 0 10*3/uL (ref 0.0–0.1)
Basophils Relative: 1 %
Eosinophils Absolute: 0 10*3/uL (ref 0.0–0.5)
Eosinophils Relative: 1 %
HCT: 29.6 % — ABNORMAL LOW (ref 36.0–46.0)
Hemoglobin: 9.5 g/dL — ABNORMAL LOW (ref 12.0–15.0)
Immature Granulocytes: 1 %
Lymphocytes Relative: 29 %
Lymphs Abs: 1.1 10*3/uL (ref 0.7–4.0)
MCH: 30.3 pg (ref 26.0–34.0)
MCHC: 32.1 g/dL (ref 30.0–36.0)
MCV: 94.3 fL (ref 80.0–100.0)
Monocytes Absolute: 0.7 10*3/uL (ref 0.1–1.0)
Monocytes Relative: 17 %
Neutro Abs: 2 10*3/uL (ref 1.7–7.7)
Neutrophils Relative %: 51 %
Platelet Count: 361 10*3/uL (ref 150–400)
RBC: 3.14 MIL/uL — ABNORMAL LOW (ref 3.87–5.11)
RDW: 16.2 % — ABNORMAL HIGH (ref 11.5–15.5)
WBC Count: 3.9 10*3/uL — ABNORMAL LOW (ref 4.0–10.5)
nRBC: 0 % (ref 0.0–0.2)

## 2019-10-30 MED ORDER — SODIUM CHLORIDE 0.9% FLUSH
10.0000 mL | INTRAVENOUS | Status: DC | PRN
  Administered 2019-10-30: 10 mL
  Filled 2019-10-30: qty 10

## 2019-10-30 MED ORDER — HEPARIN SOD (PORK) LOCK FLUSH 100 UNIT/ML IV SOLN
500.0000 [IU] | Freq: Once | INTRAVENOUS | Status: AC | PRN
  Administered 2019-10-30: 500 [IU]
  Filled 2019-10-30: qty 5

## 2019-10-30 MED ORDER — PROCHLORPERAZINE MALEATE 10 MG PO TABS
ORAL_TABLET | ORAL | Status: AC
  Filled 2019-10-30: qty 1

## 2019-10-30 MED ORDER — SODIUM CHLORIDE 0.9 % IV SOLN
800.0000 mg/m2 | Freq: Once | INTRAVENOUS | Status: AC
  Administered 2019-10-30: 1216 mg via INTRAVENOUS
  Filled 2019-10-30: qty 31.98

## 2019-10-30 MED ORDER — SODIUM CHLORIDE 0.9 % IV SOLN
Freq: Once | INTRAVENOUS | Status: AC
  Filled 2019-10-30: qty 250

## 2019-10-30 MED ORDER — PROCHLORPERAZINE MALEATE 10 MG PO TABS
5.0000 mg | ORAL_TABLET | Freq: Once | ORAL | Status: AC
  Administered 2019-10-30: 5 mg via ORAL

## 2019-10-30 NOTE — Patient Instructions (Signed)

## 2019-10-30 NOTE — Patient Instructions (Signed)
[facility] Discharge Instructions for Patients Receiving Chemotherapy  Today you received the following chemotherapy agents: gemcitabine.  To help prevent nausea and vomiting after your treatment, we encourage you to take your nausea medication as directed.   If you develop nausea and vomiting that is not controlled by your nausea medication, call the clinic.   BELOW ARE SYMPTOMS THAT SHOULD BE REPORTED IMMEDIATELY:  *FEVER GREATER THAN 100.5 F  *CHILLS WITH OR WITHOUT FEVER  NAUSEA AND VOMITING THAT IS NOT CONTROLLED WITH YOUR NAUSEA MEDICATION  *UNUSUAL SHORTNESS OF BREATH  *UNUSUAL BRUISING OR BLEEDING  TENDERNESS IN MOUTH AND THROAT WITH OR WITHOUT PRESENCE OF ULCERS  *URINARY PROBLEMS  *BOWEL PROBLEMS  UNUSUAL RASH Items with * indicate a potential emergency and should be followed up as soon as possible.  Feel free to call the clinic should you have any questions or concerns. The clinic phone number is [phone].  Please show the CHEMO ALERT CARD at check-in to the Emergency Department and triage nurse.   

## 2019-11-13 ENCOUNTER — Inpatient Hospital Stay: Payer: Medicare HMO

## 2019-11-13 ENCOUNTER — Encounter: Payer: Self-pay | Admitting: Hematology and Oncology

## 2019-11-13 ENCOUNTER — Inpatient Hospital Stay: Payer: Medicare HMO | Attending: Gynecologic Oncology

## 2019-11-13 ENCOUNTER — Other Ambulatory Visit: Payer: Self-pay

## 2019-11-13 ENCOUNTER — Inpatient Hospital Stay: Payer: Medicare HMO | Admitting: Hematology and Oncology

## 2019-11-13 DIAGNOSIS — Z5111 Encounter for antineoplastic chemotherapy: Secondary | ICD-10-CM | POA: Insufficient documentation

## 2019-11-13 DIAGNOSIS — C800 Disseminated malignant neoplasm, unspecified: Secondary | ICD-10-CM

## 2019-11-13 DIAGNOSIS — E860 Dehydration: Secondary | ICD-10-CM

## 2019-11-13 DIAGNOSIS — C786 Secondary malignant neoplasm of retroperitoneum and peritoneum: Secondary | ICD-10-CM | POA: Diagnosis present

## 2019-11-13 DIAGNOSIS — C562 Malignant neoplasm of left ovary: Secondary | ICD-10-CM

## 2019-11-13 DIAGNOSIS — T451X5A Adverse effect of antineoplastic and immunosuppressive drugs, initial encounter: Secondary | ICD-10-CM | POA: Insufficient documentation

## 2019-11-13 DIAGNOSIS — D6481 Anemia due to antineoplastic chemotherapy: Secondary | ICD-10-CM | POA: Diagnosis not present

## 2019-11-13 DIAGNOSIS — Z23 Encounter for immunization: Secondary | ICD-10-CM

## 2019-11-13 DIAGNOSIS — Z79899 Other long term (current) drug therapy: Secondary | ICD-10-CM | POA: Diagnosis not present

## 2019-11-13 DIAGNOSIS — Z7189 Other specified counseling: Secondary | ICD-10-CM

## 2019-11-13 DIAGNOSIS — Z7901 Long term (current) use of anticoagulants: Secondary | ICD-10-CM | POA: Insufficient documentation

## 2019-11-13 DIAGNOSIS — R971 Elevated cancer antigen 125 [CA 125]: Secondary | ICD-10-CM

## 2019-11-13 LAB — CMP (CANCER CENTER ONLY)
ALT: 17 U/L (ref 0–44)
AST: 30 U/L (ref 15–41)
Albumin: 3 g/dL — ABNORMAL LOW (ref 3.5–5.0)
Alkaline Phosphatase: 85 U/L (ref 38–126)
Anion gap: 7 (ref 5–15)
BUN: 14 mg/dL (ref 8–23)
CO2: 27 mmol/L (ref 22–32)
Calcium: 9.5 mg/dL (ref 8.9–10.3)
Chloride: 105 mmol/L (ref 98–111)
Creatinine: 0.89 mg/dL (ref 0.44–1.00)
GFR, Estimated: 60 mL/min — ABNORMAL LOW (ref >60)
Glucose, Bld: 123 mg/dL — ABNORMAL HIGH (ref 70–99)
Potassium: 4.1 mmol/L (ref 3.5–5.1)
Sodium: 139 mmol/L (ref 135–145)
Total Bilirubin: <0.2 mg/dL — ABNORMAL LOW (ref 0.3–1.2)
Total Protein: 7.8 g/dL (ref 6.5–8.1)

## 2019-11-13 LAB — CBC WITH DIFFERENTIAL (CANCER CENTER ONLY)
Abs Immature Granulocytes: 0.03 10*3/uL (ref 0.00–0.07)
Basophils Absolute: 0 10*3/uL (ref 0.0–0.1)
Basophils Relative: 0 %
Eosinophils Absolute: 0.1 10*3/uL (ref 0.0–0.5)
Eosinophils Relative: 1 %
HCT: 30.3 % — ABNORMAL LOW (ref 36.0–46.0)
Hemoglobin: 9.8 g/dL — ABNORMAL LOW (ref 12.0–15.0)
Immature Granulocytes: 0 %
Lymphocytes Relative: 14 %
Lymphs Abs: 1.1 10*3/uL (ref 0.7–4.0)
MCH: 30.4 pg (ref 26.0–34.0)
MCHC: 32.3 g/dL (ref 30.0–36.0)
MCV: 94.1 fL (ref 80.0–100.0)
Monocytes Absolute: 1.1 10*3/uL — ABNORMAL HIGH (ref 0.1–1.0)
Monocytes Relative: 14 %
Neutro Abs: 5.4 10*3/uL (ref 1.7–7.7)
Neutrophils Relative %: 71 %
Platelet Count: 351 10*3/uL (ref 150–400)
RBC: 3.22 MIL/uL — ABNORMAL LOW (ref 3.87–5.11)
RDW: 17.5 % — ABNORMAL HIGH (ref 11.5–15.5)
WBC Count: 7.7 10*3/uL (ref 4.0–10.5)
nRBC: 0 % (ref 0.0–0.2)

## 2019-11-13 MED ORDER — SODIUM CHLORIDE 0.9% FLUSH
10.0000 mL | INTRAVENOUS | Status: DC | PRN
  Administered 2019-11-13: 10 mL
  Filled 2019-11-13: qty 10

## 2019-11-13 MED ORDER — SODIUM CHLORIDE 0.9 % IV SOLN
Freq: Once | INTRAVENOUS | Status: AC
  Filled 2019-11-13: qty 250

## 2019-11-13 MED ORDER — SODIUM CHLORIDE 0.9 % IV SOLN
800.0000 mg/m2 | Freq: Once | INTRAVENOUS | Status: AC
  Administered 2019-11-13: 1216 mg via INTRAVENOUS
  Filled 2019-11-13: qty 31.98

## 2019-11-13 MED ORDER — PROCHLORPERAZINE MALEATE 10 MG PO TABS
5.0000 mg | ORAL_TABLET | Freq: Once | ORAL | Status: AC
  Administered 2019-11-13: 5 mg via ORAL

## 2019-11-13 MED ORDER — PROCHLORPERAZINE MALEATE 10 MG PO TABS
ORAL_TABLET | ORAL | Status: AC
  Filled 2019-11-13: qty 1

## 2019-11-13 MED ORDER — HEPARIN SOD (PORK) LOCK FLUSH 100 UNIT/ML IV SOLN
500.0000 [IU] | Freq: Once | INTRAVENOUS | Status: AC | PRN
  Administered 2019-11-13: 500 [IU]
  Filled 2019-11-13: qty 5

## 2019-11-13 MED ORDER — SODIUM CHLORIDE 0.9% FLUSH
10.0000 mL | Freq: Once | INTRAVENOUS | Status: AC | PRN
  Administered 2019-11-13: 10 mL
  Filled 2019-11-13: qty 10

## 2019-11-13 NOTE — Progress Notes (Signed)
   Covid-19 Vaccination Clinic  Name:  Whitney Floyd    MRN: [id number] DOB: 06-26-1934  11/13/2019  Whitney Floyd was observed post Covid-19 immunization for 15 mins without incident. She was provided with Vaccine Information Sheet and instruction to access the V-Safe system.   Whitney Floyd was instructed to call 911 with any severe reactions post vaccine: Marland Kitchen Difficulty breathing  . Swelling of face and throat  . A fast heartbeat  . A bad rash all over body  . Dizziness and weakness   Immunizations Administered    Name Date Dose VIS Date Route   Pfizer COVID-19 Vaccine 11/13/2019 12:57 PM 0.3 mL 04/03/2018 Intramuscular   Manufacturer: [place]   Lot: [id number]   [place]: [id number]

## 2019-11-13 NOTE — Patient Instructions (Signed)
[facility] Discharge Instructions for Patients Receiving Chemotherapy  Today you received the following chemotherapy agents: gemcitabine.  To help prevent nausea and vomiting after your treatment, we encourage you to take your nausea medication as directed.   If you develop nausea and vomiting that is not controlled by your nausea medication, call the clinic.   BELOW ARE SYMPTOMS THAT SHOULD BE REPORTED IMMEDIATELY:  *FEVER GREATER THAN 100.5 F  *CHILLS WITH OR WITHOUT FEVER  NAUSEA AND VOMITING THAT IS NOT CONTROLLED WITH YOUR NAUSEA MEDICATION  *UNUSUAL SHORTNESS OF BREATH  *UNUSUAL BRUISING OR BLEEDING  TENDERNESS IN MOUTH AND THROAT WITH OR WITHOUT PRESENCE OF ULCERS  *URINARY PROBLEMS  *BOWEL PROBLEMS  UNUSUAL RASH Items with * indicate a potential emergency and should be followed up as soon as possible.  Feel free to call the clinic should you have any questions or concerns. The clinic phone number is [phone].  Please show the CHEMO ALERT CARD at check-in to the Emergency Department and triage nurse.   

## 2019-11-13 NOTE — Assessment & Plan Note (Signed)
She has intermittent abdominal discomfort and early satiety due to presence of abdominal carcinomatosis I recommend frequent small meals and supportive care with pain management as needed

## 2019-11-13 NOTE — Progress Notes (Signed)
[facility] OFFICE PROGRESS NOTE  Patient Care Team: Ernestene Kiel, MD as PCP - General (Internal Medicine) Berniece Salines, DO as PCP - Cardiology (Cardiology) Awanda Mink Craige Cotta, RN as Oncology Nurse Navigator (Oncology) Lafonda Mosses, MD as Consulting Physician (Gynecologic Oncology)  ASSESSMENT & PLAN:  Ovarian cancer, left [facility]) I have reviewed multiple imaging studies Overall, she have stable disease control We discussed the risk and benefits of continuing treatment and she is in agreement I recommend minimum 3 more cycles of treatment before repeating imaging studies, due around at the end of the year  Carcinomatosis [facility]) She has intermittent abdominal discomfort and early satiety due to presence of abdominal carcinomatosis I recommend frequent small meals and supportive care with pain management as needed  Anemia due to antineoplastic chemotherapy This is likely due to recent treatment. The patient denies recent history of bleeding such as epistaxis, hematuria or hematochezia. She is asymptomatic from the anemia. I will observe for now.  She does not require transfusion now. I will continue the chemotherapy at current dose without dosage adjustment.  If the anemia gets progressive worse in the future, I might have to delay her treatment or adjust the chemotherapy dose.    No orders of the defined types were placed in this encounter.   All questions were answered. The patient knows to call the clinic with any problems, questions or concerns. The total time spent in the appointment was 25 minutes encounter with patients including review of chart and various tests results, discussions about plan of care and coordination of care plan   Heath Lark, MD 11/13/2019 11:05 AM  INTERVAL HISTORY: Please see below for problem oriented charting. She returns with her daughter for further follow-up She has mild intermittent abdominal discomfort No recent constipation no  nausea The patient denies any recent signs or symptoms of bleeding such as spontaneous epistaxis, hematuria or hematochezia. 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     Ovarian cancer, left ([place])  02/07/2019 Imaging   Outside CT chest showed no evidence of metastatic disease   03/20/2019 Imaging   Outside Ct abdomen and pelvis showed diffuse carcinomatosis, left adnexa mass and ascites   04/11/2019 Pathology Results   FINAL MICROSCOPIC DIAGNOSIS:   A. OMENTUM, LEFT ABDOMINAL, NEEDLE CORE BIOPSY:  - Metastatic carcinoma.  See comment    COMMENT:   Immunohistochemical stains show that the tumor cells are positive for PAX8, ER, WT1, CK7 (focal) and CK 5/6 (patchy); and negative for p63, calretinin, D2-40, CK20, GATA3 and CDX2.  This mmunohistochemical profile is consistent with a gynecologic primary and suggestive of a low-grade serous carcinoma.   04/11/2019 Procedure   Image guided core biopsy of the omental disease. In addition, small amount of ascites was collected for cytology   04/18/2019 Cancer Staging   Staging form: Ovary, Fallopian Tube, and Primary Peritoneal Carcinoma, AJCC 8th Edition - Clinical stage from 04/18/2019: FIGO Stage IIIC (cT3c, cN0, cM0) - Signed by Heath Lark, MD on 04/18/2019   04/18/2019 Tumor Marker   Patient's tumor was tested for the following markers: CA-125 Results of the tumor marker test revealed 402   04/22/2019 - 06/24/2019 Chemotherapy  The patient had carboplatin and taxol x 4 for chemotherapy treatment.     05/13/2019 Tumor Marker   Patient's tumor was tested for the following markers: CA-125 Results of the tumor marker test revealed 146    06/12/2019 Imaging   2.6 cm left adnexal mass in this patient with known ovarian cancer, previously 3.3 cm.   Improving peritoneal disease/omental caking, as above.   No abdominopelvic ascites.   06/24/2019 Tumor Marker   Patient's tumor was tested for the following markers: CA-125 Results of the tumor marker test revealed 16.9   07/23/2019 Pathology Results   A. OMENTUM, EXCISION:  - Metastatic carcinoma to omentum, 29.6 cm, consistent with history of low-grade serous carcinoma   B. 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  On intra-abdominal entry, significant disease burden noted including large omental cake adherent to the anterior abdominal wall, extensive diaphragmatic disease (right greater than left), adhesions between the liver and the diaphragm, disease within the porta hepatis.  After conversion to exploratory laparotomy, additional disease was noted on the stomach and significant disease burden on the sigmoid and rectum, both the colon and mesentery.  All of this disease was diffuse but small volume.  Additionally there were miliary implants along much of the abdominal peritoneum.  Uterus was 4-6 cm and normal in appearance.   Right fallopian tube and ovary atrophic and normal appearing, left ovary mildly enlarged and adherent to the sidewall with what appeared to be some tumor studding.  Small tumor implants were noted over much of the small bowel mesentery with 2 loops of small bowel adherent to the underside of the omental cake.  Appendix with tumor burden and adherent to the right sidewall.  Transverse colon significantly adherent to the omental cake which measured approximately 25 x 10 cm. Suboptimal resection due to burden of disease.  Given inability to resect, resection of tumor burden thought to be causing patient symptoms was performed   08/20/2019 Imaging   1. Interval progression of peritoneal disease. Although there is been and expected reduction in volume of omental cake status post debulking surgery there has been progressive peritoneal disease throughout the remaining portions of the abdomen and pelvis. 2. Increase in size of left adnexal mass. 3. Signs of serosal involvement of the distal small bowel, cecum and descending colon. There is also been progressive tumor infiltration throughout the sigmoid mesocolon within the pelvis. 4. Aortic atherosclerosis.     08/20/2019 Tumor Marker   Patient's tumor was tested for the following markers: CA-125 Results of the tumor marker test revealed 52.3   08/21/2019 -  Chemotherapy   The patient had gemcitabine for chemotherapy treatment.      Genetic Testing   Patient has genetic testing done for MMR. Results revealed patient has the following: MMR - normal   09/18/2019 Tumor Marker  Patient's tumor was tested for the following markers: CA-125 Results of the tumor marker test revealed 45.   10/22/2019 Imaging   1. Overall stable appearing extensive peritoneal carcinomatosis with thick omental caking, peritoneal implants and adenopathy. No new or significantly progressive findings. 2. Stable left adnexal mass.   Aortic Atherosclerosis (ICD10-I70.0).    10/23/2019 Tumor Marker   Patient's tumor was tested for the following markers: CA-125 Results of the tumor marker test revealed 38.4     REVIEW OF SYSTEMS:   Constitutional: Denies fevers, chills or abnormal weight loss Eyes: Denies blurriness of vision Ears, nose, mouth, throat, and face: Denies mucositis or sore throat Respiratory: Denies cough, dyspnea or wheezes Cardiovascular: Denies palpitation, chest discomfort or lower extremity swelling Gastrointestinal:  Denies nausea, heartburn or change in bowel habits Skin: Denies abnormal skin rashes Lymphatics: Denies new lymphadenopathy or easy bruising Neurological:Denies numbness, tingling or new weaknesses Behavioral/Psych: Mood is stable, no new changes  All other systems were reviewed with the patient and are negative.  I have reviewed the past medical history, past surgical history, social history and family history with the patient and they are unchanged from previous note.  ALLERGIES:  is allergic to codeine sulfate [codeine], tetanus toxoids, horse-derived products, and sulfa antibiotics.  MEDICATIONS:  Current Outpatient Medications  Medication Sig Dispense Refill  . acetaminophen (TYLENOL) 500 MG tablet Take 500-1,000 mg by mouth every 6 (six) hours as needed (for pain.).    Marland Kitchen cholecalciferol (VITAMIN D3) 25 MCG (1000 UNIT) tablet Take 2,000 Units by mouth 2 (two) times daily.    Marland Kitchen ELIQUIS 5 MG TABS tablet Take 5 mg by mouth 2 (two) times daily.    . metoprolol succinate (TOPROL XL) 25 MG 24 hr tablet Take 1.5 tablets (37.5 mg total) by mouth daily. 135 tablet 1  . omeprazole (PRILOSEC) 20 MG capsule Take 20 mg by mouth 2 (two) times daily before a meal.     . ondansetron (ZOFRAN) 4 MG tablet Take 4 mg by mouth every 8 (eight) hours as needed for nausea or vomiting. PRN for nausea    . prochlorperazine (COMPAZINE) 10 MG tablet Take 10 mg by mouth every 6 (six) hours as needed for nausea or vomiting. PRN for nausea     No  current facility-administered medications for this visit.    PHYSICAL EXAMINATION: ECOG PERFORMANCE STATUS: 1 - Symptomatic but completely ambulatory  Vitals:   11/13/19 1053  BP: (!) 144/79  Pulse: 97  Resp: 18  SpO2: 100%   Filed Weights   11/13/19 1053  Weight: 123 lb 12.8 oz (56.2 kg)    GENERAL:alert, no distress and comfortable SKIN: skin color, texture, turgor are normal, no rashes or significant lesions EYES: normal, Conjunctiva are pink and non-injected, sclera clear OROPHARYNX:no exudate, no erythema and lips, buccal mucosa, and tongue normal  NECK: supple, thyroid normal size, non-tender, without nodularity LYMPH:  no palpable lymphadenopathy in the cervical, axillary or inguinal LUNGS: clear to auscultation and percussion with normal breathing effort HEART: regular rate & rhythm and no murmurs and no lower extremity edema ABDOMEN:abdomen soft, non-tender and normal bowel sounds Musculoskeletal:no cyanosis of digits and no clubbing  NEURO: alert & oriented x 3 with fluent speech, no focal motor/sensory deficits  LABORATORY DATA:  I have reviewed the data as listed    Component Value Date/Time   NA 139 11/13/2019 1021   NA 137 03/04/2019 1107   K 4.1 11/13/2019 1021   CL 105 11/13/2019 1021  CO2 27 11/13/2019 1021   GLUCOSE 123 (H) 11/13/2019 1021   BUN 14 11/13/2019 1021   BUN 11 03/04/2019 1107   CREATININE 0.89 11/13/2019 1021   CALCIUM 9.5 11/13/2019 1021   PROT 7.8 11/13/2019 1021   PROT 7.4 03/04/2019 1107   ALBUMIN 3.0 (L) 11/13/2019 1021   ALBUMIN 3.6 03/04/2019 1107   AST 30 11/13/2019 1021   ALT 17 11/13/2019 1021   ALKPHOS 85 11/13/2019 1021   BILITOT <0.2 (L) 11/13/2019 1021   GFRNONAA 60 (L) 11/13/2019 1021   GFRAA >60 10/30/2019 0854    No results found for: SPEP, UPEP  Lab Results  Component Value Date   WBC 7.7 11/13/2019   NEUTROABS 5.4 11/13/2019   HGB 9.8 (L) 11/13/2019   HCT 30.3 (L) 11/13/2019   MCV 94.1 11/13/2019   PLT  351 11/13/2019      Chemistry      Component Value Date/Time   NA 139 11/13/2019 1021   NA 137 03/04/2019 1107   K 4.1 11/13/2019 1021   CL 105 11/13/2019 1021   CO2 27 11/13/2019 1021   BUN 14 11/13/2019 1021   BUN 11 03/04/2019 1107   CREATININE 0.89 11/13/2019 1021      Component Value Date/Time   CALCIUM 9.5 11/13/2019 1021   ALKPHOS 85 11/13/2019 1021   AST 30 11/13/2019 1021   ALT 17 11/13/2019 1021   BILITOT <0.2 (L) 11/13/2019 1021       RADIOGRAPHIC STUDIES: I have personally reviewed the radiological images as listed and agreed with the findings in the report. CT ABDOMEN PELVIS W CONTRAST  Result Date: 10/22/2019 CLINICAL DATA:  Restaging ovarian cancer. EXAM: CT ABDOMEN AND PELVIS WITH CONTRAST TECHNIQUE: Multidetector CT imaging of the abdomen and pelvis was performed using the standard protocol following bolus administration of intravenous contrast. CONTRAST:  OMNIPAQUE IOHEXOL 300 MG/ML  SOLN COMPARISON:  CT scan 08/20/2019 FINDINGS: Lower chest: Stable mild eventration of both hemidiaphragms. No pulmonary lesions, pleural effusions or pleural lesions. The heart is normal in size. No pericardial effusion. Stable aortic calcifications. Hepatobiliary: There are the extensive peritoneal surface disease involving the liver appears overall stable. I do not see any definite new or progressive findings. No intrahepatic metastatic disease. Stable small gallstones. No common bile duct dilatation. Pancreas: No mass, inflammation or ductal dilatation. Spleen: Normal size. No focal lesions. Adrenals/Urinary Tract: The adrenal glands and kidneys are unremarkable and stable. The bladder is unremarkable. Stomach/Bowel: The stomach, duodenum, small bowel and colon are grossly normal. No acute inflammatory changes or obstructive findings. Stable appearing areas of peritoneal surface disease involving the colon. No new or progressive findings. Vascular/Lymphatic: Stable atherosclerotic  calcifications involving the aorta and iliac arteries but no aneurysm or dissection. Stable scattered mesenteric and retroperitoneal lymph nodes. Overall stable appearing extensive peritoneal carcinomatosis with thick omental caking. Disease between the left hepatic lobe and the stomach on image 28/2 measures 5.7 x 1.9 cm and previously measured 4.3 x 1.8 cm. Lesion adjacent to the splenic flexure area the colon on image 31/2 measures 4.3 x 2.2 cm and previously measured 4.4 x 2.4 cm. Elongated omental lesion adjacent to the splenic flexure region of the colon on image 25/2 measures 5.4 x 2.1 cm and previously measured 5.7 x 2.0 cm. Mesenteric implant versus is periportal lymph node on image 24/2 measures a maximum 2.1 cm and previously measured 2.0 cm. The left adnexal mass measures approximately 5.1 x 4.1 cm and previously measured 4.4 x 4.2  cm. Reproductive: The uterus and right ovary appear normal and stable. Other: No abdominal/pelvic ascites. Musculoskeletal: No significant bony findings. IMPRESSION: 1. Overall stable appearing extensive peritoneal carcinomatosis with thick omental caking, peritoneal implants and adenopathy. No new or significantly progressive findings. 2. Stable left adnexal mass. Aortic Atherosclerosis (ICD10-I70.0). Electronically Signed   By: Marijo Sanes M.D.   On: 10/22/2019 10:33

## 2019-11-13 NOTE — Assessment & Plan Note (Signed)
I have reviewed multiple imaging studies Overall, she have stable disease control We discussed the risk and benefits of continuing treatment and she is in agreement I recommend minimum 3 more cycles of treatment before repeating imaging studies, due around at the end of the year

## 2019-11-13 NOTE — Patient Instructions (Signed)

## 2019-11-13 NOTE — Assessment & Plan Note (Signed)

## 2019-11-20 ENCOUNTER — Other Ambulatory Visit: Payer: Self-pay

## 2019-11-20 ENCOUNTER — Inpatient Hospital Stay: Payer: Medicare HMO

## 2019-11-20 VITALS — BP 131/67 | HR 75 | Temp 98.0°F | Resp 16

## 2019-11-20 DIAGNOSIS — C562 Malignant neoplasm of left ovary: Secondary | ICD-10-CM

## 2019-11-20 DIAGNOSIS — Z7901 Long term (current) use of anticoagulants: Secondary | ICD-10-CM | POA: Diagnosis not present

## 2019-11-20 DIAGNOSIS — Z7189 Other specified counseling: Secondary | ICD-10-CM

## 2019-11-20 DIAGNOSIS — R971 Elevated cancer antigen 125 [CA 125]: Secondary | ICD-10-CM

## 2019-11-20 DIAGNOSIS — Z79899 Other long term (current) drug therapy: Secondary | ICD-10-CM | POA: Diagnosis not present

## 2019-11-20 DIAGNOSIS — C800 Disseminated malignant neoplasm, unspecified: Secondary | ICD-10-CM

## 2019-11-20 DIAGNOSIS — Z5111 Encounter for antineoplastic chemotherapy: Secondary | ICD-10-CM | POA: Diagnosis not present

## 2019-11-20 DIAGNOSIS — C786 Secondary malignant neoplasm of retroperitoneum and peritoneum: Secondary | ICD-10-CM | POA: Diagnosis not present

## 2019-11-20 DIAGNOSIS — T451X5A Adverse effect of antineoplastic and immunosuppressive drugs, initial encounter: Secondary | ICD-10-CM | POA: Diagnosis not present

## 2019-11-20 DIAGNOSIS — D6481 Anemia due to antineoplastic chemotherapy: Secondary | ICD-10-CM | POA: Diagnosis not present

## 2019-11-20 LAB — CBC WITH DIFFERENTIAL (CANCER CENTER ONLY)
Abs Immature Granulocytes: 0.15 10*3/uL — ABNORMAL HIGH (ref 0.00–0.07)
Basophils Absolute: 0 10*3/uL (ref 0.0–0.1)
Basophils Relative: 0 %
Eosinophils Absolute: 0 10*3/uL (ref 0.0–0.5)
Eosinophils Relative: 0 %
HCT: 28 % — ABNORMAL LOW (ref 36.0–46.0)
Hemoglobin: 8.8 g/dL — ABNORMAL LOW (ref 12.0–15.0)
Immature Granulocytes: 2 %
Lymphocytes Relative: 30 %
Lymphs Abs: 2.1 10*3/uL (ref 0.7–4.0)
MCH: 29.7 pg (ref 26.0–34.0)
MCHC: 31.4 g/dL (ref 30.0–36.0)
MCV: 94.6 fL (ref 80.0–100.0)
Monocytes Absolute: 1.2 10*3/uL — ABNORMAL HIGH (ref 0.1–1.0)
Monocytes Relative: 17 %
Neutro Abs: 3.6 10*3/uL (ref 1.7–7.7)
Neutrophils Relative %: 51 %
Platelet Count: 345 10*3/uL (ref 150–400)
RBC: 2.96 MIL/uL — ABNORMAL LOW (ref 3.87–5.11)
RDW: 17.1 % — ABNORMAL HIGH (ref 11.5–15.5)
WBC Count: 7.1 10*3/uL (ref 4.0–10.5)
nRBC: 0 % (ref 0.0–0.2)

## 2019-11-20 LAB — CMP (CANCER CENTER ONLY)
ALT: 35 U/L (ref 0–44)
AST: 57 U/L — ABNORMAL HIGH (ref 15–41)
Albumin: 3 g/dL — ABNORMAL LOW (ref 3.5–5.0)
Alkaline Phosphatase: 125 U/L (ref 38–126)
Anion gap: 9 (ref 5–15)
BUN: 18 mg/dL (ref 8–23)
CO2: 26 mmol/L (ref 22–32)
Calcium: 9.6 mg/dL (ref 8.9–10.3)
Chloride: 107 mmol/L (ref 98–111)
Creatinine: 0.94 mg/dL (ref 0.44–1.00)
GFR, Estimated: 56 mL/min — ABNORMAL LOW (ref >60)
Glucose, Bld: 105 mg/dL — ABNORMAL HIGH (ref 70–99)
Potassium: 3.7 mmol/L (ref 3.5–5.1)
Sodium: 142 mmol/L (ref 135–145)
Total Bilirubin: <0.2 mg/dL — ABNORMAL LOW (ref 0.3–1.2)
Total Protein: 8 g/dL (ref 6.5–8.1)

## 2019-11-20 MED ORDER — PROCHLORPERAZINE MALEATE 10 MG PO TABS
5.0000 mg | ORAL_TABLET | Freq: Once | ORAL | Status: AC
  Administered 2019-11-20: 5 mg via ORAL

## 2019-11-20 MED ORDER — SODIUM CHLORIDE 0.9 % IV SOLN
800.0000 mg/m2 | Freq: Once | INTRAVENOUS | Status: AC
  Administered 2019-11-20: 1216 mg via INTRAVENOUS
  Filled 2019-11-20: qty 31.98

## 2019-11-20 MED ORDER — SODIUM CHLORIDE 0.9 % IV SOLN
Freq: Once | INTRAVENOUS | Status: AC
  Filled 2019-11-20: qty 250

## 2019-11-20 MED ORDER — HEPARIN SOD (PORK) LOCK FLUSH 100 UNIT/ML IV SOLN
500.0000 [IU] | Freq: Once | INTRAVENOUS | Status: AC | PRN
  Administered 2019-11-20: 500 [IU]
  Filled 2019-11-20: qty 5

## 2019-11-20 MED ORDER — PROCHLORPERAZINE MALEATE 10 MG PO TABS
ORAL_TABLET | ORAL | Status: AC
  Filled 2019-11-20: qty 1

## 2019-11-20 MED ORDER — SODIUM CHLORIDE 0.9% FLUSH
10.0000 mL | INTRAVENOUS | Status: DC | PRN
  Administered 2019-11-20: 10 mL
  Filled 2019-11-20: qty 10

## 2019-11-20 NOTE — Patient Instructions (Signed)
[facility] °Discharge Instructions for Patients Receiving Chemotherapy ° °Today you received the following chemotherapy agents Gemzar ° °To help prevent nausea and vomiting after your treatment, we encourage you to take your nausea medication as directed. °  °If you develop nausea and vomiting that is not controlled by your nausea medication, call the clinic.  ° °BELOW ARE SYMPTOMS THAT SHOULD BE REPORTED IMMEDIATELY: °· *FEVER GREATER THAN 100.5 F °· *CHILLS WITH OR WITHOUT FEVER °· NAUSEA AND VOMITING THAT IS NOT CONTROLLED WITH YOUR NAUSEA MEDICATION °· *UNUSUAL SHORTNESS OF BREATH °· *UNUSUAL BRUISING OR BLEEDING °· TENDERNESS IN MOUTH AND THROAT WITH OR WITHOUT PRESENCE OF ULCERS °· *URINARY PROBLEMS °· *BOWEL PROBLEMS °· UNUSUAL RASH °Items with * indicate a potential emergency and should be followed up as soon as possible. ° °Feel free to call the clinic should you have any questions or concerns. The clinic phone number is [phone]. ° °Please show the CHEMO ALERT CARD at check-in to the Emergency Department and triage nurse. ° ° °

## 2019-11-21 LAB — CA 125: Cancer Antigen (CA) 125: 54 U/mL — ABNORMAL HIGH (ref 0.0–38.1)

## 2019-12-04 ENCOUNTER — Inpatient Hospital Stay: Payer: Medicare HMO

## 2019-12-04 ENCOUNTER — Inpatient Hospital Stay: Payer: Medicare HMO | Admitting: Nutrition

## 2019-12-04 ENCOUNTER — Other Ambulatory Visit: Payer: Self-pay

## 2019-12-04 VITALS — BP 120/66 | HR 82 | Temp 97.9°F | Resp 18

## 2019-12-04 DIAGNOSIS — R971 Elevated cancer antigen 125 [CA 125]: Secondary | ICD-10-CM

## 2019-12-04 DIAGNOSIS — C562 Malignant neoplasm of left ovary: Secondary | ICD-10-CM

## 2019-12-04 DIAGNOSIS — D6481 Anemia due to antineoplastic chemotherapy: Secondary | ICD-10-CM | POA: Diagnosis not present

## 2019-12-04 DIAGNOSIS — C800 Disseminated malignant neoplasm, unspecified: Secondary | ICD-10-CM

## 2019-12-04 DIAGNOSIS — Z79899 Other long term (current) drug therapy: Secondary | ICD-10-CM | POA: Diagnosis not present

## 2019-12-04 DIAGNOSIS — Z95828 Presence of other vascular implants and grafts: Secondary | ICD-10-CM

## 2019-12-04 DIAGNOSIS — Z7189 Other specified counseling: Secondary | ICD-10-CM

## 2019-12-04 DIAGNOSIS — Z5111 Encounter for antineoplastic chemotherapy: Secondary | ICD-10-CM | POA: Diagnosis not present

## 2019-12-04 DIAGNOSIS — C786 Secondary malignant neoplasm of retroperitoneum and peritoneum: Secondary | ICD-10-CM | POA: Diagnosis not present

## 2019-12-04 DIAGNOSIS — T451X5A Adverse effect of antineoplastic and immunosuppressive drugs, initial encounter: Secondary | ICD-10-CM | POA: Diagnosis not present

## 2019-12-04 DIAGNOSIS — Z7901 Long term (current) use of anticoagulants: Secondary | ICD-10-CM | POA: Diagnosis not present

## 2019-12-04 LAB — CMP (CANCER CENTER ONLY)
ALT: 16 U/L (ref 0–44)
AST: 40 U/L (ref 15–41)
Albumin: 3 g/dL — ABNORMAL LOW (ref 3.5–5.0)
Alkaline Phosphatase: 92 U/L (ref 38–126)
Anion gap: 10 (ref 5–15)
BUN: 15 mg/dL (ref 8–23)
CO2: 23 mmol/L (ref 22–32)
Calcium: 9.6 mg/dL (ref 8.9–10.3)
Chloride: 104 mmol/L (ref 98–111)
Creatinine: 0.96 mg/dL (ref 0.44–1.00)
GFR, Estimated: 59 mL/min — ABNORMAL LOW (ref >60)
Glucose, Bld: 109 mg/dL — ABNORMAL HIGH (ref 70–99)
Potassium: 4.1 mmol/L (ref 3.5–5.1)
Sodium: 137 mmol/L (ref 135–145)
Total Bilirubin: 0.2 mg/dL — ABNORMAL LOW (ref 0.3–1.2)
Total Protein: 7.8 g/dL (ref 6.5–8.1)

## 2019-12-04 LAB — CBC WITH DIFFERENTIAL (CANCER CENTER ONLY)
Abs Immature Granulocytes: 0.03 10*3/uL (ref 0.00–0.07)
Basophils Absolute: 0.1 10*3/uL (ref 0.0–0.1)
Basophils Relative: 1 %
Eosinophils Absolute: 0.2 10*3/uL (ref 0.0–0.5)
Eosinophils Relative: 3 %
HCT: 28 % — ABNORMAL LOW (ref 36.0–46.0)
Hemoglobin: 9.1 g/dL — ABNORMAL LOW (ref 12.0–15.0)
Immature Granulocytes: 0 %
Lymphocytes Relative: 20 %
Lymphs Abs: 1.4 10*3/uL (ref 0.7–4.0)
MCH: 30.5 pg (ref 26.0–34.0)
MCHC: 32.5 g/dL (ref 30.0–36.0)
MCV: 94 fL (ref 80.0–100.0)
Monocytes Absolute: 1 10*3/uL (ref 0.1–1.0)
Monocytes Relative: 14 %
Neutro Abs: 4.2 10*3/uL (ref 1.7–7.7)
Neutrophils Relative %: 62 %
Platelet Count: 386 10*3/uL (ref 150–400)
RBC: 2.98 MIL/uL — ABNORMAL LOW (ref 3.87–5.11)
RDW: 17.3 % — ABNORMAL HIGH (ref 11.5–15.5)
WBC Count: 6.8 10*3/uL (ref 4.0–10.5)
nRBC: 0 % (ref 0.0–0.2)

## 2019-12-04 MED ORDER — PROCHLORPERAZINE MALEATE 10 MG PO TABS
5.0000 mg | ORAL_TABLET | Freq: Once | ORAL | Status: AC
  Administered 2019-12-04: 5 mg via ORAL

## 2019-12-04 MED ORDER — SODIUM CHLORIDE 0.9 % IV SOLN
800.0000 mg/m2 | Freq: Once | INTRAVENOUS | Status: AC
  Administered 2019-12-04: 1216 mg via INTRAVENOUS
  Filled 2019-12-04: qty 31.98

## 2019-12-04 MED ORDER — SODIUM CHLORIDE 0.9 % IV SOLN
Freq: Once | INTRAVENOUS | Status: AC
  Filled 2019-12-04: qty 250

## 2019-12-04 MED ORDER — HEPARIN SOD (PORK) LOCK FLUSH 100 UNIT/ML IV SOLN
500.0000 [IU] | Freq: Once | INTRAVENOUS | Status: AC | PRN
  Administered 2019-12-04: 500 [IU]
  Filled 2019-12-04: qty 5

## 2019-12-04 MED ORDER — SODIUM CHLORIDE 0.9% FLUSH
10.0000 mL | INTRAVENOUS | Status: AC | PRN
  Administered 2019-12-04: 10 mL
  Filled 2019-12-04: qty 10

## 2019-12-04 MED ORDER — SODIUM CHLORIDE 0.9% FLUSH
10.0000 mL | INTRAVENOUS | Status: DC | PRN
  Administered 2019-12-04: 10 mL
  Filled 2019-12-04: qty 10

## 2019-12-04 MED ORDER — PROCHLORPERAZINE MALEATE 10 MG PO TABS
ORAL_TABLET | ORAL | Status: AC
  Filled 2019-12-04: qty 1

## 2019-12-04 NOTE — Progress Notes (Signed)
Nutrition follow-up completed with patient during infusion for ovarian cancer. Weight is stable at approximately 123 pounds. Patient complains of indigestion.  Reports she will try Tums. Has only had nausea/vomiting 1 time. Reports she drinks 1-2 cartons of oral nutrition supplements daily. States her daughter offers her many food choices that she doesn't enjoy.    Nutrition Diagnosis: Unintended wt loss is stable.  Intervention: Continue Ensure Plus/Enlive 1-2 bottles daily. Education provided on indigestion. Provided foods to incorporate into a list for her daughter. Patient appreciative.  Monitoring, Evaluation, Goals: Patient will tolerate adequate calories and protein for wt maintenance.  Next Visit:To be scheduled as needed.  **Disclaimer: This note was dictated with voice recognition software. Similar sounding words can inadvertently be transcribed and this note may contain transcription errors which may not have been corrected upon publication of note.**

## 2019-12-04 NOTE — Patient Instructions (Signed)
[facility] Discharge Instructions for Patients Receiving Chemotherapy  Today you received the following chemotherapy agents: gemcitabine.  To help prevent nausea and vomiting after your treatment, we encourage you to take your nausea medication as directed.   If you develop nausea and vomiting that is not controlled by your nausea medication, call the clinic.   BELOW ARE SYMPTOMS THAT SHOULD BE REPORTED IMMEDIATELY:  *FEVER GREATER THAN 100.5 F  *CHILLS WITH OR WITHOUT FEVER  NAUSEA AND VOMITING THAT IS NOT CONTROLLED WITH YOUR NAUSEA MEDICATION  *UNUSUAL SHORTNESS OF BREATH  *UNUSUAL BRUISING OR BLEEDING  TENDERNESS IN MOUTH AND THROAT WITH OR WITHOUT PRESENCE OF ULCERS  *URINARY PROBLEMS  *BOWEL PROBLEMS  UNUSUAL RASH Items with * indicate a potential emergency and should be followed up as soon as possible.  Feel free to call the clinic should you have any questions or concerns. The clinic phone number is [phone].  Please show the CHEMO ALERT CARD at check-in to the Emergency Department and triage nurse.   

## 2019-12-04 NOTE — Patient Instructions (Signed)

## 2019-12-11 ENCOUNTER — Inpatient Hospital Stay: Payer: Medicare HMO

## 2019-12-11 ENCOUNTER — Telehealth: Payer: Self-pay

## 2019-12-11 ENCOUNTER — Inpatient Hospital Stay: Payer: Medicare HMO | Attending: Gynecologic Oncology | Admitting: Hematology and Oncology

## 2019-12-11 ENCOUNTER — Encounter: Payer: Self-pay | Admitting: Hematology and Oncology

## 2019-12-11 ENCOUNTER — Other Ambulatory Visit: Payer: Self-pay

## 2019-12-11 VITALS — BP 111/58 | HR 84 | Temp 97.7°F | Resp 20 | Ht 62.0 in | Wt 122.8 lb

## 2019-12-11 DIAGNOSIS — T451X5A Adverse effect of antineoplastic and immunosuppressive drugs, initial encounter: Secondary | ICD-10-CM | POA: Diagnosis not present

## 2019-12-11 DIAGNOSIS — K30 Functional dyspepsia: Secondary | ICD-10-CM | POA: Insufficient documentation

## 2019-12-11 DIAGNOSIS — Z79899 Other long term (current) drug therapy: Secondary | ICD-10-CM | POA: Insufficient documentation

## 2019-12-11 DIAGNOSIS — Z7901 Long term (current) use of anticoagulants: Secondary | ICD-10-CM | POA: Diagnosis not present

## 2019-12-11 DIAGNOSIS — E860 Dehydration: Secondary | ICD-10-CM

## 2019-12-11 DIAGNOSIS — Z9221 Personal history of antineoplastic chemotherapy: Secondary | ICD-10-CM | POA: Diagnosis not present

## 2019-12-11 DIAGNOSIS — C786 Secondary malignant neoplasm of retroperitoneum and peritoneum: Secondary | ICD-10-CM | POA: Diagnosis present

## 2019-12-11 DIAGNOSIS — C800 Disseminated malignant neoplasm, unspecified: Secondary | ICD-10-CM

## 2019-12-11 DIAGNOSIS — R971 Elevated cancer antigen 125 [CA 125]: Secondary | ICD-10-CM

## 2019-12-11 DIAGNOSIS — D6481 Anemia due to antineoplastic chemotherapy: Secondary | ICD-10-CM | POA: Insufficient documentation

## 2019-12-11 DIAGNOSIS — C562 Malignant neoplasm of left ovary: Secondary | ICD-10-CM | POA: Diagnosis not present

## 2019-12-11 DIAGNOSIS — Z7189 Other specified counseling: Secondary | ICD-10-CM

## 2019-12-11 DIAGNOSIS — R531 Weakness: Secondary | ICD-10-CM | POA: Diagnosis not present

## 2019-12-11 DIAGNOSIS — R5383 Other fatigue: Secondary | ICD-10-CM | POA: Insufficient documentation

## 2019-12-11 LAB — CBC WITH DIFFERENTIAL (CANCER CENTER ONLY)
Abs Immature Granulocytes: 0.09 10*3/uL — ABNORMAL HIGH (ref 0.00–0.07)
Basophils Absolute: 0 10*3/uL (ref 0.0–0.1)
Basophils Relative: 1 %
Eosinophils Absolute: 0.1 10*3/uL (ref 0.0–0.5)
Eosinophils Relative: 1 %
HCT: 26.8 % — ABNORMAL LOW (ref 36.0–46.0)
Hemoglobin: 8.6 g/dL — ABNORMAL LOW (ref 12.0–15.0)
Immature Granulocytes: 2 %
Lymphocytes Relative: 24 %
Lymphs Abs: 1.2 10*3/uL (ref 0.7–4.0)
MCH: 30.3 pg (ref 26.0–34.0)
MCHC: 32.1 g/dL (ref 30.0–36.0)
MCV: 94.4 fL (ref 80.0–100.0)
Monocytes Absolute: 1 10*3/uL (ref 0.1–1.0)
Monocytes Relative: 20 %
Neutro Abs: 2.6 10*3/uL (ref 1.7–7.7)
Neutrophils Relative %: 52 %
Platelet Count: 413 10*3/uL — ABNORMAL HIGH (ref 150–400)
RBC: 2.84 MIL/uL — ABNORMAL LOW (ref 3.87–5.11)
RDW: 16.9 % — ABNORMAL HIGH (ref 11.5–15.5)
WBC Count: 5 10*3/uL (ref 4.0–10.5)
nRBC: 0 % (ref 0.0–0.2)

## 2019-12-11 LAB — CMP (CANCER CENTER ONLY)
ALT: 31 U/L (ref 0–44)
AST: 69 U/L — ABNORMAL HIGH (ref 15–41)
Albumin: 3 g/dL — ABNORMAL LOW (ref 3.5–5.0)
Alkaline Phosphatase: 125 U/L (ref 38–126)
Anion gap: 10 (ref 5–15)
BUN: 17 mg/dL (ref 8–23)
CO2: 23 mmol/L (ref 22–32)
Calcium: 9.2 mg/dL (ref 8.9–10.3)
Chloride: 105 mmol/L (ref 98–111)
Creatinine: 0.95 mg/dL (ref 0.44–1.00)
GFR, Estimated: 59 mL/min — ABNORMAL LOW (ref >60)
Glucose, Bld: 121 mg/dL — ABNORMAL HIGH (ref 70–99)
Potassium: 3.8 mmol/L (ref 3.5–5.1)
Sodium: 138 mmol/L (ref 135–145)
Total Bilirubin: <0.2 mg/dL — ABNORMAL LOW (ref 0.3–1.2)
Total Protein: 7.9 g/dL (ref 6.5–8.1)

## 2019-12-11 MED ORDER — HEPARIN SOD (PORK) LOCK FLUSH 100 UNIT/ML IV SOLN
500.0000 [IU] | Freq: Once | INTRAVENOUS | Status: AC | PRN
  Administered 2019-12-11: 500 [IU]
  Filled 2019-12-11: qty 5

## 2019-12-11 NOTE — Assessment & Plan Note (Signed)
We had numerous goals of care discussions in the past She is at peace of stopping treatment and not repeat imaging study She is aware of poor prognosis with stopping treatment She is in agreement for palliative care and hospice referral We discussed CODE STATUS and she agreed for DO NOT RESUSCITATE I gave her a DO NOT RESUSCITATE order to take home

## 2019-12-11 NOTE — Patient Instructions (Signed)

## 2019-12-11 NOTE — Telephone Encounter (Signed)
TC to [facility] to refer Pt. For palliative [phone] Spoke with Manus Gunning at intake informed her to call Pt's daughter Butch Penny  to set up arrangements at [phone]

## 2019-12-11 NOTE — Progress Notes (Signed)
[place] OFFICE PROGRESS NOTE  Patient Care Team: Ernestene Kiel, MD as PCP - General (Internal Medicine) Berniece Salines, DO as PCP - Cardiology (Cardiology) Awanda Mink Craige Cotta, RN as Oncology Nurse Navigator (Oncology) Lafonda Mosses, MD as Consulting Physician (Gynecologic Oncology)  ASSESSMENT & PLAN:  Ovarian cancer, left [facility]) She is very miserable today The chemotherapy has taken away her quality of life She has sensation of indigestion frequently and excessive fatigue We discussed the risk and benefits of discontinuation of treatment and she agrees to stop chemotherapy today We discussed the risk and benefits of repeating imaging study.  The patient is not willing to undergo further chemotherapy and at this point in time, I do not recommend her to order imaging study as it would not change management I will send referral for hospice for her  Anemia due to antineoplastic chemotherapy She is profoundly fatigued She does not need transfusion support The cause of her anemia is from treatment  Generalized weakness She has profound generalized weakness secondary to treatment We are in agreement to stop therapy  Goals of care, counseling/discussion We had numerous goals of care discussions in the past She is at peace of stopping treatment and not repeat imaging study She is aware of poor prognosis with stopping treatment She is in agreement for palliative care and hospice referral We discussed CODE STATUS and she agreed for DO NOT RESUSCITATE I gave her a DO NOT RESUSCITATE order to take home   No orders of the defined types were placed in this encounter.   All questions were answered. The patient knows to call the clinic with any problems, questions or concerns. The total time spent in the appointment was 40 minutes encounter with patients including review of chart and various tests results, discussions about plan of care and coordination of care plan    Heath Lark, MD 12/11/2019 12:07 PM  INTERVAL HISTORY: Please see below for problem oriented charting. 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     Ovarian cancer, left ([place])  02/07/2019 Imaging   Outside CT chest showed no evidence of metastatic disease   03/20/2019 Imaging   Outside Ct abdomen and pelvis showed diffuse carcinomatosis, left adnexa mass and ascites   04/11/2019 Pathology Results   FINAL MICROSCOPIC DIAGNOSIS:   A. OMENTUM, LEFT ABDOMINAL, NEEDLE CORE BIOPSY:  - Metastatic carcinoma.  See comment    COMMENT:   Immunohistochemical stains show that the tumor cells are positive for PAX8, ER, WT1, CK7 (focal) and CK 5/6 (patchy); and negative for p63, calretinin, D2-40, CK20, GATA3 and CDX2.  This mmunohistochemical profile is consistent with a gynecologic primary and suggestive of a low-grade serous carcinoma.   04/11/2019 Procedure   Image guided core biopsy of the omental disease. In addition, small amount of ascites was collected for cytology   04/18/2019 Cancer Staging   Staging form: Ovary, Fallopian Tube, and Primary Peritoneal Carcinoma, AJCC 8th Edition - Clinical stage from 04/18/2019: FIGO Stage IIIC (cT3c, cN0, cM0) -  Signed by Heath Lark, MD on 04/18/2019   04/18/2019 Tumor Marker   Patient's tumor was tested for the following markers: CA-125 Results of the tumor marker test revealed 402   04/22/2019 - 06/24/2019 Chemotherapy   The patient  had carboplatin and taxol x 4 for chemotherapy treatment.     05/13/2019 Tumor Marker   Patient's tumor was tested for the following markers: CA-125 Results of the tumor marker test revealed 146   06/12/2019 Imaging   2.6 cm left adnexal mass in this patient with known ovarian cancer, previously 3.3 cm.   Improving peritoneal disease/omental caking, as above.   No abdominopelvic ascites.   06/24/2019 Tumor Marker   Patient's tumor was tested for the following markers: CA-125 Results of the tumor marker test revealed 16.9   07/23/2019 Pathology Results   A. OMENTUM, EXCISION:  - Metastatic carcinoma to omentum, 29.6 cm, consistent with history of low-grade serous carcinoma   B. 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  On intra-abdominal entry, significant disease burden noted including large omental cake adherent to the anterior abdominal wall, extensive diaphragmatic disease (right greater than left), adhesions between the liver and the diaphragm, disease within the porta hepatis.  After conversion to exploratory laparotomy, additional disease was noted on the stomach and significant disease burden on the sigmoid and rectum, both the  colon and mesentery.  All of this disease was diffuse but small volume.  Additionally there were miliary implants along much of the abdominal peritoneum.  Uterus was 4-6 cm and normal in appearance.  Right fallopian tube and ovary atrophic and normal appearing, left ovary mildly enlarged and adherent to the sidewall with what appeared to be some tumor studding.  Small tumor implants were noted over much of the small bowel mesentery with 2 loops of small bowel adherent to the underside of the omental cake.  Appendix with tumor burden and adherent to the right sidewall.  Transverse colon significantly adherent to the omental cake which measured approximately 25 x 10 cm. Suboptimal resection due to burden of disease.  Given inability to resect, resection of tumor burden thought to be causing patient symptoms was performed   08/20/2019 Imaging   1. Interval progression of peritoneal disease. Although there is been and expected reduction in volume of omental cake status post debulking surgery there has been progressive peritoneal disease throughout the remaining portions of the abdomen and pelvis. 2. Increase in size of left adnexal mass. 3. Signs of serosal involvement of the distal small bowel, cecum and descending colon. There is also been progressive tumor infiltration throughout the sigmoid mesocolon within the pelvis. 4. Aortic atherosclerosis.     08/20/2019 Tumor Marker   Patient's tumor was tested for the following markers: CA-125 Results of the tumor marker test revealed 52.3   08/21/2019 - 12/04/2019 Chemotherapy  The patient had gemcitabine for chemotherapy treatment.      Genetic Testing   Patient has genetic testing done for MMR. Results revealed patient has the following: MMR - normal   09/18/2019 Tumor Marker   Patient's tumor was tested for the following markers: CA-125 Results of the tumor marker test revealed 45.   10/22/2019 Imaging   1. Overall stable appearing extensive  peritoneal carcinomatosis with thick omental caking, peritoneal implants and adenopathy. No new or significantly progressive findings. 2. Stable left adnexal mass.   Aortic Atherosclerosis (ICD10-I70.0).   10/23/2019 Tumor Marker   Patient's tumor was tested for the following markers: CA-125 Results of the tumor marker test revealed 38.4   11/20/2019 Tumor Marker   Patient's tumor was tested for the following markers: CA-125 Results of the tumor marker test revealed 54     REVIEW OF SYSTEMS:   Constitutional: Denies fevers, chills or abnormal weight loss Eyes: Denies blurriness of vision Ears, nose, mouth, throat, and face: Denies mucositis or sore throat Respiratory: Denies cough, dyspnea or wheezes Cardiovascular: Denies palpitation, chest discomfort or lower extremity swelling Skin: Denies abnormal skin rashes Lymphatics: Denies new lymphadenopathy or easy bruising Behavioral/Psych: Mood is stable, no new changes  All other systems were reviewed with the patient and are negative.  I have reviewed the past medical history, past surgical history, social history and family history with the patient and they are unchanged from previous note.  ALLERGIES:  is allergic to codeine sulfate [codeine], tetanus toxoids, horse-derived products, and sulfa antibiotics.  MEDICATIONS:  Current Outpatient Medications  Medication Sig Dispense Refill  . acetaminophen (TYLENOL) 500 MG tablet Take 500-1,000 mg by mouth every 6 (six) hours as needed (for pain.).    Marland Kitchen cholecalciferol (VITAMIN D3) 25 MCG (1000 UNIT) tablet Take 2,000 Units by mouth 2 (two) times daily.    Marland Kitchen ELIQUIS 5 MG TABS tablet Take 5 mg by mouth 2 (two) times daily.    . metoprolol succinate (TOPROL XL) 25 MG 24 hr tablet Take 1.5 tablets (37.5 mg total) by mouth daily. 135 tablet 1  . omeprazole (PRILOSEC) 20 MG capsule Take 20 mg by mouth 2 (two) times daily before a meal.     . ondansetron (ZOFRAN) 4 MG tablet Take 4 mg by  mouth every 8 (eight) hours as needed for nausea or vomiting. PRN for nausea    . prochlorperazine (COMPAZINE) 10 MG tablet Take 10 mg by mouth every 6 (six) hours as needed for nausea or vomiting. PRN for nausea     No current facility-administered medications for this visit.    PHYSICAL EXAMINATION: ECOG PERFORMANCE STATUS: 2 - Symptomatic, <50% confined to bed  Vitals:   12/11/19 0946  BP: (!) 111/58  Pulse: 84  Resp: 20  Temp: 97.7 F (36.5 C)  SpO2: 100%   Filed Weights   12/11/19 0946  Weight: 122 lb 12.8 oz (55.7 kg)    GENERAL:alert, no distress and comfortable.  She looks pale   NEURO: alert & oriented x 3 with fluent speech, no focal motor/sensory deficits  LABORATORY DATA:  I have reviewed the data as listed    Component Value Date/Time   NA 138 12/11/2019 0925   NA 137 03/04/2019 1107   K 3.8 12/11/2019 0925   CL 105 12/11/2019 0925   CO2 23 12/11/2019 0925   GLUCOSE 121 (H) 12/11/2019 0925   BUN 17 12/11/2019 0925   BUN 11 03/04/2019 1107   CREATININE 0.95 12/11/2019 0925  CALCIUM 9.2 12/11/2019 0925   PROT 7.9 12/11/2019 0925   PROT 7.4 03/04/2019 1107   ALBUMIN 3.0 (L) 12/11/2019 0925   ALBUMIN 3.6 03/04/2019 1107   AST 69 (H) 12/11/2019 0925   ALT 31 12/11/2019 0925   ALKPHOS 125 12/11/2019 0925   BILITOT <0.2 (L) 12/11/2019 0925   GFRNONAA 59 (L) 12/11/2019 0925   GFRAA >60 10/30/2019 0854    No results found for: SPEP, UPEP  Lab Results  Component Value Date   WBC 5.0 12/11/2019   NEUTROABS 2.6 12/11/2019   HGB 8.6 (L) 12/11/2019   HCT 26.8 (L) 12/11/2019   MCV 94.4 12/11/2019   PLT 413 (H) 12/11/2019      Chemistry      Component Value Date/Time   NA 138 12/11/2019 0925   NA 137 03/04/2019 1107   K 3.8 12/11/2019 0925   CL 105 12/11/2019 0925   CO2 23 12/11/2019 0925   BUN 17 12/11/2019 0925   BUN 11 03/04/2019 1107   CREATININE 0.95 12/11/2019 0925      Component Value Date/Time   CALCIUM 9.2 12/11/2019 0925   ALKPHOS  125 12/11/2019 0925   AST 69 (H) 12/11/2019 0925   ALT 31 12/11/2019 0925   BILITOT <0.2 (L) 12/11/2019 5732

## 2019-12-11 NOTE — Assessment & Plan Note (Signed)
She is profoundly fatigued She does not need transfusion support The cause of her anemia is from treatment

## 2019-12-11 NOTE — Assessment & Plan Note (Signed)
She has profound generalized weakness secondary to treatment We are in agreement to stop therapy

## 2019-12-11 NOTE — Assessment & Plan Note (Signed)
She is very miserable today The chemotherapy has taken away her quality of life She has sensation of indigestion frequently and excessive fatigue We discussed the risk and benefits of discontinuation of treatment and she agrees to stop chemotherapy today We discussed the risk and benefits of repeating imaging study.  The patient is not willing to undergo further chemotherapy and at this point in time, I do not recommend her to order imaging study as it would not change management I will send referral for hospice for her

## 2019-12-12 ENCOUNTER — Encounter: Payer: Self-pay | Admitting: Hematology and Oncology

## 2019-12-12 LAB — CA 125: Cancer Antigen (CA) 125: 88.8 U/mL — ABNORMAL HIGH (ref 0.0–38.1)

## 2019-12-13 ENCOUNTER — Encounter: Payer: Self-pay | Admitting: *Deleted

## 2019-12-13 ENCOUNTER — Telehealth: Payer: Self-pay | Admitting: Hematology and Oncology

## 2019-12-13 NOTE — Progress Notes (Addendum)
    Patient has been referred to [facility] program for home-based palliative care services, per Dr. Alvy Bimler.  Initial visit is scheduled for 12/18/19 at [facility] [phone]

## 2019-12-13 NOTE — Telephone Encounter (Signed)
Per 11/3 los, no changes made to pt schedule

## 2019-12-16 ENCOUNTER — Encounter: Payer: Self-pay | Admitting: Hematology and Oncology

## 2019-12-18 ENCOUNTER — Encounter: Payer: Self-pay | Admitting: *Deleted

## 2019-12-18 NOTE — Progress Notes (Signed)
° °  Pt was admitted today in her home to [facility] after receiving referral from Dr Heath Lark.  Present in the home today for admission was the pt herself, Dtr-Cathy, Dtr-Whitney, and DIL Butch Penny.  Pt confirmed her wish to be a DNR and the document is visible in the home.  Palliative nurse will visit 1-3x/month and SW will provide support as well.  Wynetta Fines, RN  [phone]

## 2019-12-18 NOTE — Progress Notes (Signed)
   Patient was enrolled into the [facility] program today.  This is a home-based palliative care program offered by [facility].  Patient will receive 1-3 nurse visits per month as well as social work support.  Ron[facility] [place] Referrals Specialist [phone]

## 2019-12-19 ENCOUNTER — Telehealth: Payer: Self-pay

## 2019-12-19 NOTE — Telephone Encounter (Signed)
Mechele Claude, nurse with [facility] called. She enrolled Whitney Floyd into the palliative care program. She does not want to take the oxycodone for pain. She is taking Tramadol Rx that she has left over. She is taking 50 mg once a day and taking tylenol for pain. Requesting refill on Tramadol and ask that it be sent to [facility] in [place]. She is also taking Decadron 4 mg daily. She has a left over Rx and does not need a refill. Just FYI

## 2019-12-20 ENCOUNTER — Other Ambulatory Visit: Payer: Self-pay | Admitting: Hematology and Oncology

## 2019-12-20 DIAGNOSIS — G893 Neoplasm related pain (acute) (chronic): Secondary | ICD-10-CM

## 2019-12-20 MED ORDER — TRAMADOL HCL 50 MG PO TABS
100.0000 mg | ORAL_TABLET | Freq: Four times a day (QID) | ORAL | 0 refills | Status: AC | PRN
Start: 2019-12-20 — End: ?

## 2019-12-20 NOTE — Telephone Encounter (Signed)
RN notified Mechele Claude at [facility] of refill being sent to pharmacy and need for patient education regarding constipation.

## 2019-12-20 NOTE — Telephone Encounter (Signed)
I sent tramadol refill: she can take up to 2 tabs each time with tylenol if needed Warn her about risk of constipation

## 2019-12-30 ENCOUNTER — Other Ambulatory Visit: Payer: Self-pay | Admitting: Cardiology

## 2019-12-30 NOTE — Telephone Encounter (Signed)
Rx refill sent to pharmacy. 

## 2020-01-14 ENCOUNTER — Telehealth: Payer: Self-pay | Admitting: Hematology and Oncology

## 2020-01-14 DIAGNOSIS — C786 Secondary malignant neoplasm of retroperitoneum and peritoneum: Secondary | ICD-10-CM | POA: Diagnosis not present

## 2020-01-14 DIAGNOSIS — R109 Unspecified abdominal pain: Secondary | ICD-10-CM | POA: Diagnosis not present

## 2020-01-14 NOTE — Telephone Encounter (Signed)
I spoke with Luetta Nutting, the physician assistant from [facility] and summarized the patient's medical history I recommend the patient not to be admitted given stability of her vitals The patient and or family needs to be in closer contact with home-based hospice program for better symptom management

## 2020-01-16 DIAGNOSIS — E46 Unspecified protein-calorie malnutrition: Secondary | ICD-10-CM | POA: Diagnosis not present

## 2020-01-16 DIAGNOSIS — I4891 Unspecified atrial fibrillation: Secondary | ICD-10-CM | POA: Diagnosis not present

## 2020-01-16 DIAGNOSIS — Z0000 Encounter for general adult medical examination without abnormal findings: Secondary | ICD-10-CM | POA: Diagnosis not present

## 2020-01-16 DIAGNOSIS — I700 Atherosclerosis of aorta: Secondary | ICD-10-CM | POA: Diagnosis not present

## 2020-01-16 DIAGNOSIS — Z1339 Encounter for screening examination for other mental health and behavioral disorders: Secondary | ICD-10-CM | POA: Diagnosis not present

## 2020-01-16 DIAGNOSIS — C562 Malignant neoplasm of left ovary: Secondary | ICD-10-CM | POA: Diagnosis not present

## 2020-01-16 DIAGNOSIS — Z6821 Body mass index (BMI) 21.0-21.9, adult: Secondary | ICD-10-CM | POA: Diagnosis not present

## 2020-01-16 DIAGNOSIS — K56600 Partial intestinal obstruction, unspecified as to cause: Secondary | ICD-10-CM | POA: Diagnosis not present

## 2020-01-16 DIAGNOSIS — C786 Secondary malignant neoplasm of retroperitoneum and peritoneum: Secondary | ICD-10-CM | POA: Diagnosis not present

## 2020-01-16 DIAGNOSIS — Z1331 Encounter for screening for depression: Secondary | ICD-10-CM | POA: Diagnosis not present

## 2020-01-24 NOTE — Progress Notes (Signed)
   Discharge Summary: [facility] Discharge  [facility] is the home-based palliative care program of [facility]  Discharge Reason: Admitted to Inpatient Hospice Services at [place] ([place])  Admission Date: 12/18/2019 Discharge Date: 01/17/2020  Summary of [facility] Services: Svetlana Bagby was admitted to [facility] with a diagnosis of Ovarian Cancer  Other medical history includes anemia due to antineoplastic chemo, aortic atherosclerosis, A fib with anticoagulation, GERD, Hx migraines, PUD Nursing and SW services were provided including goals of care discussions, teaching/assessment of medical conditions, medication teaching, coordination of care with medical providers and supportive counseling. At the time of admission, patient was realistic about her disease process. She had issues with increased weakness, functional decline and developed nausea/vomiting on 01/11/2020. On call staff provided phone and visit support. Pt elected to go to ED at [facility] for evaluation on 01/14/2020 and was found to have a partial bowel obstruction and further escalation of symptoms. After further goals of care discussions, patient family elected hospice care and was directly admitted to the inpatient unit for symptom management. Goals of Care: Comfort  Advance Directives: DNR  Jeanne Ivan, RN, MSN, [facility]

## 2020-04-06 ENCOUNTER — Telehealth: Payer: Self-pay

## 2020-04-07 NOTE — Telephone Encounter (Signed)
Whitney Floyd, socail worker with [facility] called and left a message. [name] passed this afternoon.

## 2020-04-07 DEATH — deceased

## 2020-04-28 ENCOUNTER — Ambulatory Visit: Payer: Medicare HMO | Admitting: Cardiology

## 2021-09-25 IMAGING — CT CT BIOPSY
1 of 3 series · 15 of 32 positions shown, 19 images · non-contrast
Comparison: none

INDICATION: 84-year-old with evidence of diffuse omental and peritoneal disease.
Tissue diagnosis is needed.

[Series 2: i-spiral 5.0 b31f · axial · 0.98mm/px · z∈[-188,+144]mm · 15 of 105 slices shown, 19 images]
[im 5/105  soft-tissue]
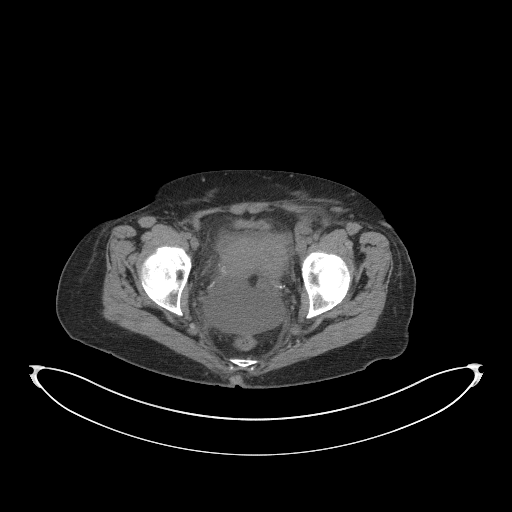
[im 5/105  bone]
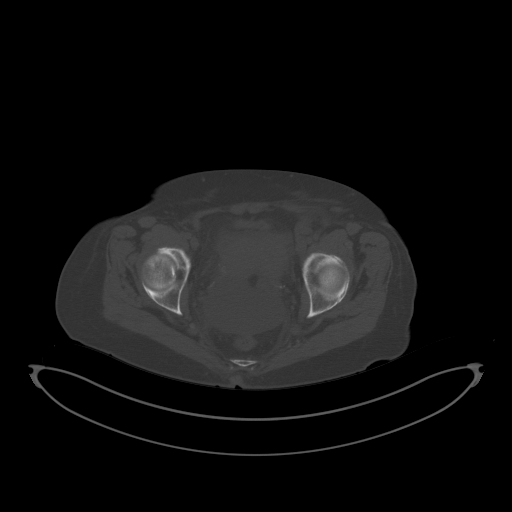
[im 14/105  soft-tissue]
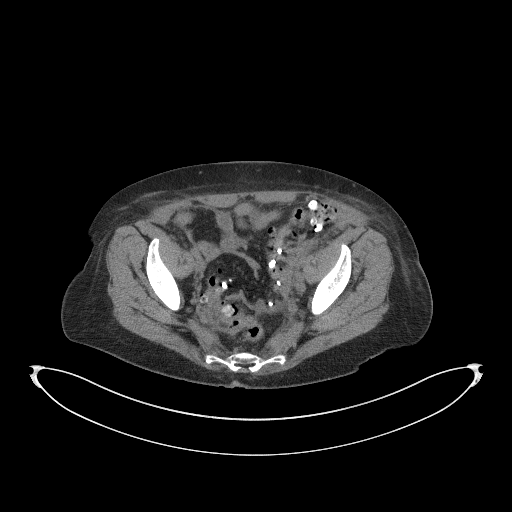
[im 23/105  soft-tissue]
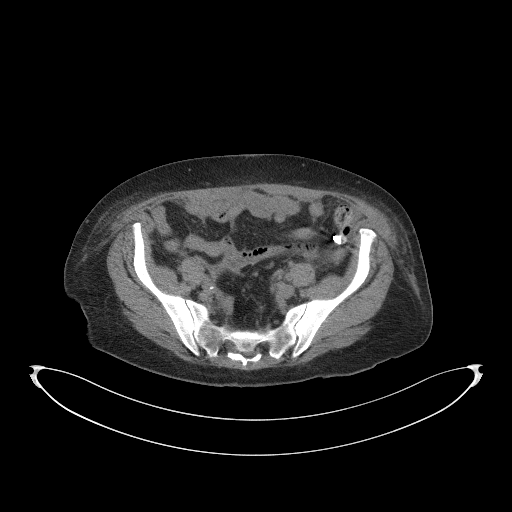
[im 28/105  soft-tissue]
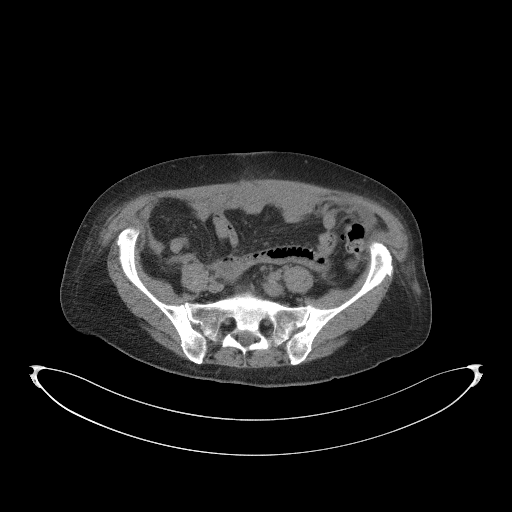
[im 37/105  soft-tissue]
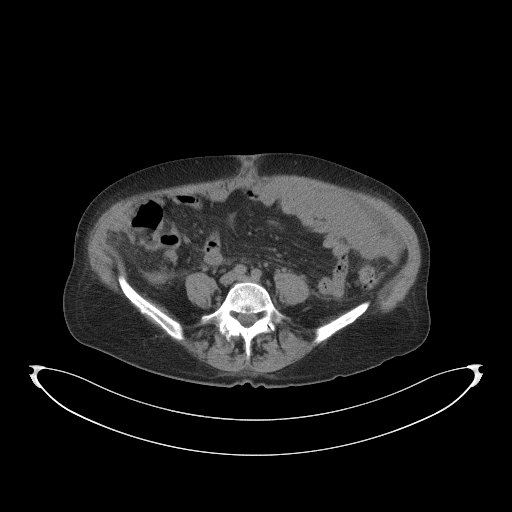
[im 46/105  soft-tissue]
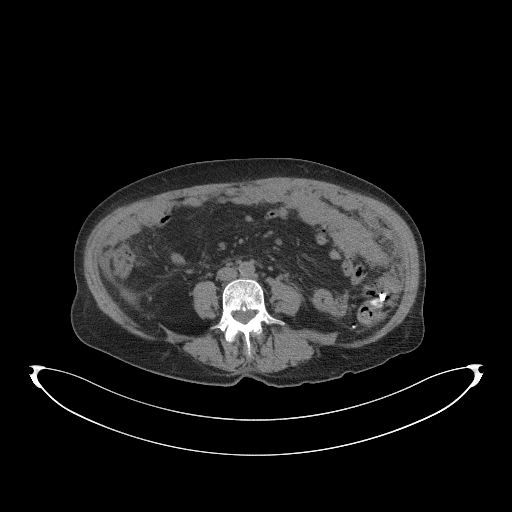
[im 55/105  soft-tissue]
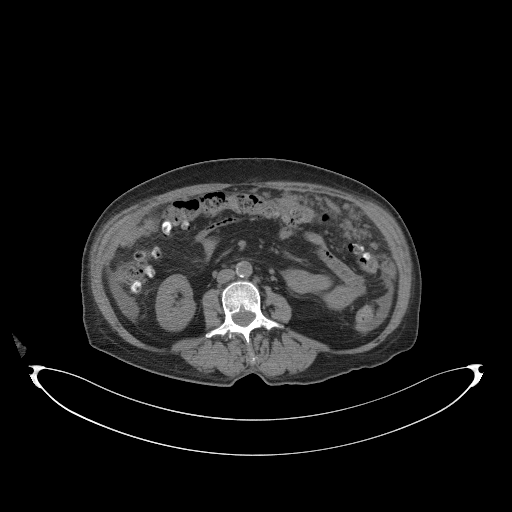
[im 59/105  soft-tissue]
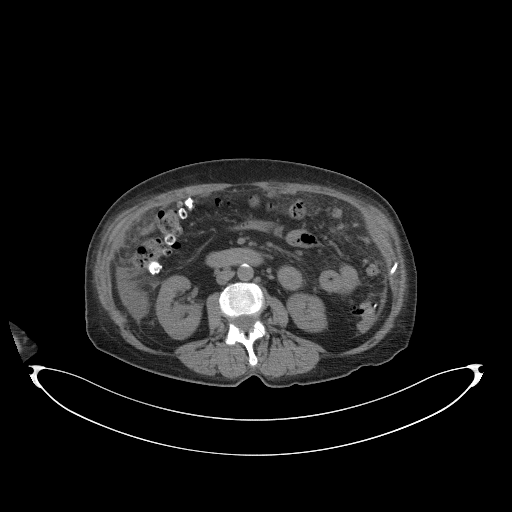
[im 68/105  soft-tissue]
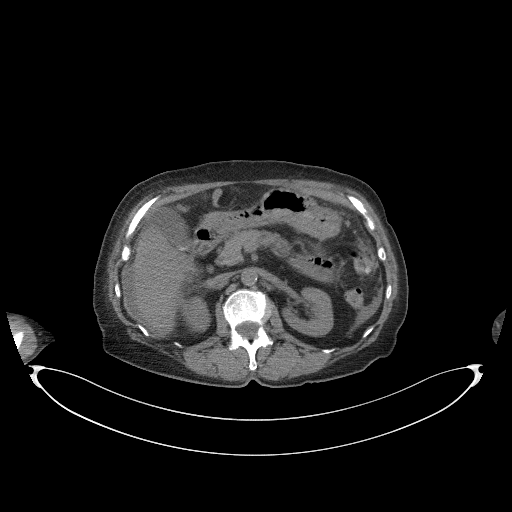
[im 68/105  bone]
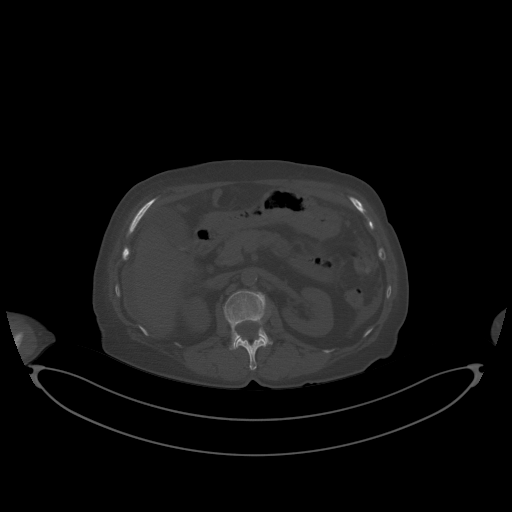
[im 77/105  soft-tissue]
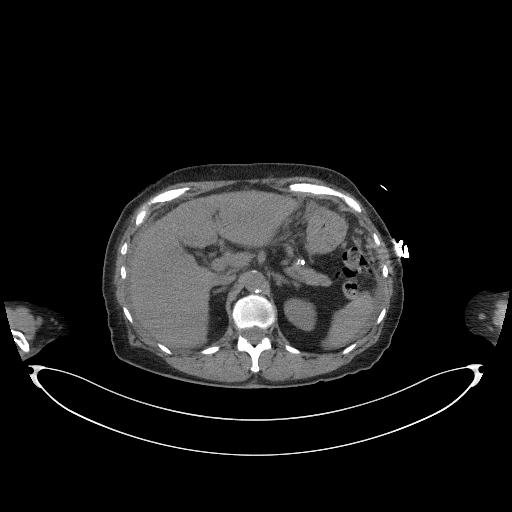
[im 82/105  soft-tissue]
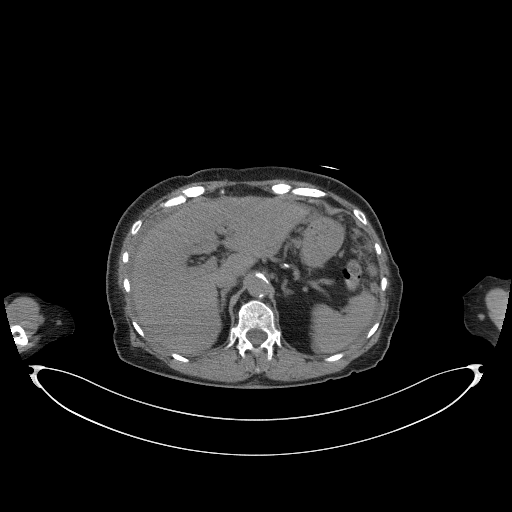
[im 86/105  lung]
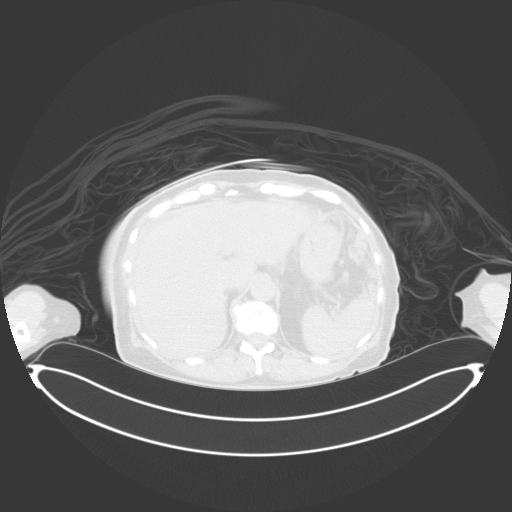
[im 91/105  soft-tissue]
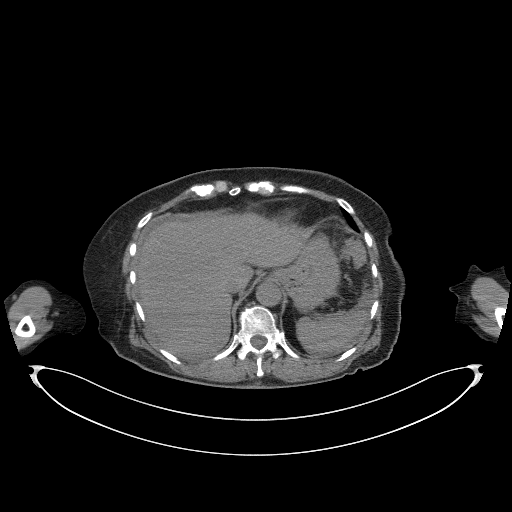
[im 91/105  lung]
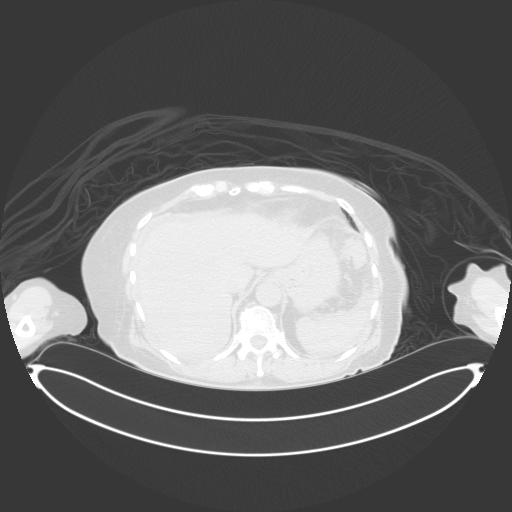
[im 95/105  lung]
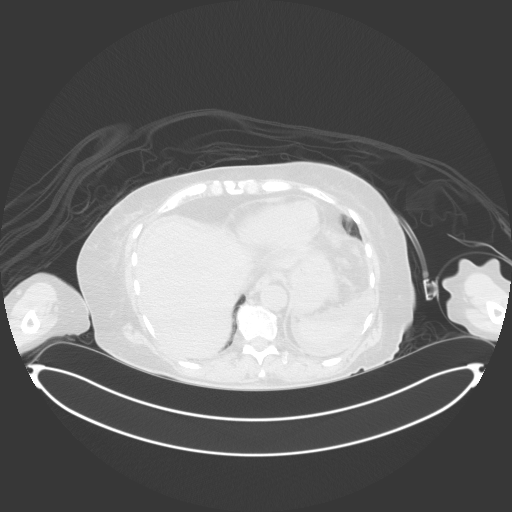
[im 100/105  soft-tissue]
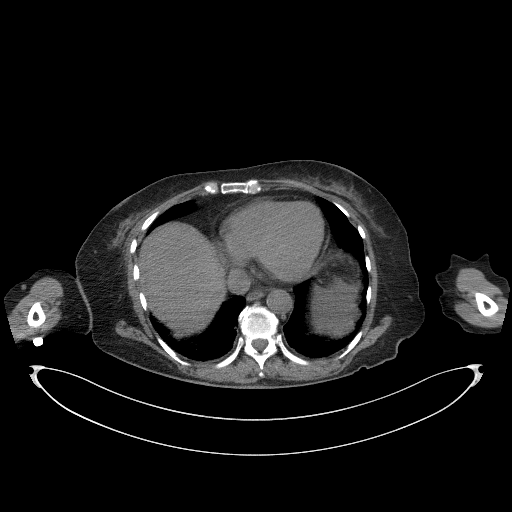
[im 100/105  lung]
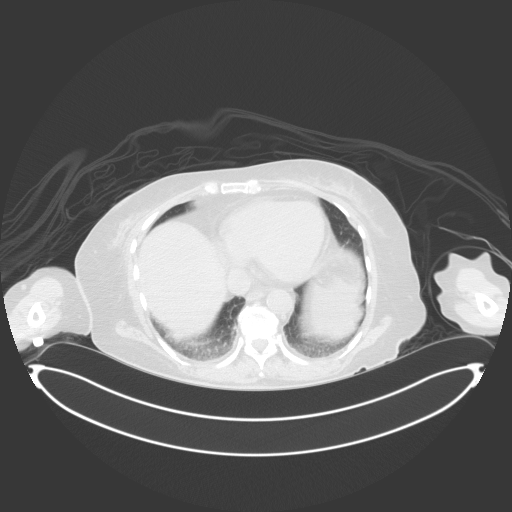

[15 of 32 positions shown; findings below may reference images not displayed]

EXAM:
IMAGE GUIDED OMENTAL BIOPSY AND FLUID ASPIRATION

MEDICATIONS:
None.

ANESTHESIA/SEDATION:
Moderate (conscious) sedation was employed during this procedure. A
total of Versed 2.0 mg and Fentanyl 100 mcg was administered
intravenously.

Moderate Sedation Time: 21 minutes. The patient's level of
consciousness and vital signs were monitored continuously by
radiology nursing throughout the procedure under my direct
supervision.

FLUOROSCOPY TIME:  Fluoroscopy Time: None

COMPLICATIONS:
None immediate.

PROCEDURE:
Informed written consent was obtained from the patient after a
thorough discussion of the procedural risks, benefits and
alternatives. All questions were addressed. A timeout was performed
prior to the initiation of the procedure.

Patient was placed supine. CT images through the abdomen were
obtained. Omental disease in the left anterior abdomen was
identified and targeted. This area was also evaluated with
ultrasound. Skin was prepped with chlorhexidine and sterile field
was created. Skin was anesthetized with 1% lidocaine. Small incision
was made. 17 gauge coaxial needle was directed into the omental
thickening using ultrasound guidance. Needle position was confirmed
with CT. Total of 4 core biopsies were obtained under ultrasound
guidance. Specimens placed in saline. In addition, approximately 30
mL of red serous ascites was aspirated from the 17 gauge needle
prior to removal. Bandage placed over the puncture site.
FINDINGS: Extensive omental thickening in the left anterior abdomen. Needle
position confirmed within the omental disease with CT and
ultrasound. Four adequate core biopsies were obtained. Small amount
of ascites was also collected for cytology.
IMPRESSION: Image guided core biopsy of the omental disease. In addition, small
amount of ascites was collected for cytology.
